# Patient Record
Sex: Male | Born: 1950 | Race: Black or African American | Hispanic: No | Marital: Married | Smoking: Never smoker
Health system: Southern US, Community
[De-identification: ages and names within clinical notes are randomized; demographics above are authoritative.]

## PROBLEM LIST (undated history)

## (undated) DIAGNOSIS — I2510 Atherosclerotic heart disease of native coronary artery without angina pectoris: Secondary | ICD-10-CM

## (undated) DIAGNOSIS — E785 Hyperlipidemia, unspecified: Secondary | ICD-10-CM

## (undated) DIAGNOSIS — C61 Malignant neoplasm of prostate: Secondary | ICD-10-CM

## (undated) DIAGNOSIS — E119 Type 2 diabetes mellitus without complications: Secondary | ICD-10-CM

## (undated) DIAGNOSIS — M1990 Unspecified osteoarthritis, unspecified site: Secondary | ICD-10-CM

## (undated) DIAGNOSIS — M1612 Unilateral primary osteoarthritis, left hip: Secondary | ICD-10-CM

## (undated) DIAGNOSIS — I10 Essential (primary) hypertension: Secondary | ICD-10-CM

## (undated) DIAGNOSIS — I700 Atherosclerosis of aorta: Secondary | ICD-10-CM

## (undated) HISTORY — DX: Essential (primary) hypertension: I10

## (undated) HISTORY — DX: Hyperlipidemia, unspecified: E78.5

## (undated) HISTORY — DX: Type 2 diabetes mellitus without complications: E11.9

## (undated) HISTORY — DX: Atherosclerosis of aorta: I70.0

## (undated) HISTORY — PX: JOINT REPLACEMENT: SHX530

## (undated) HISTORY — DX: Unspecified osteoarthritis, unspecified site: M19.90

## (undated) HISTORY — DX: Malignant neoplasm of prostate: C61

## (undated) HISTORY — DX: Unilateral primary osteoarthritis, left hip: M16.12

## (undated) HISTORY — DX: Atherosclerotic heart disease of native coronary artery without angina pectoris: I25.10

## (undated) HISTORY — PX: PROSTATE BIOPSY: SHX241

---

## 2012-08-26 ENCOUNTER — Ambulatory Visit (INDEPENDENT_AMBULATORY_CARE_PROVIDER_SITE_OTHER): Payer: Managed Care, Other (non HMO) | Admitting: Internal Medicine

## 2012-08-26 ENCOUNTER — Ambulatory Visit: Payer: Managed Care, Other (non HMO)

## 2012-08-26 VITALS — BP 133/84 | HR 66 | Temp 97.8°F | Resp 16 | Ht 65.0 in | Wt 180.0 lb

## 2012-08-26 DIAGNOSIS — M25552 Pain in left hip: Secondary | ICD-10-CM

## 2012-08-26 DIAGNOSIS — Z01818 Encounter for other preprocedural examination: Secondary | ICD-10-CM

## 2012-08-26 DIAGNOSIS — M1610 Unilateral primary osteoarthritis, unspecified hip: Secondary | ICD-10-CM

## 2012-08-26 DIAGNOSIS — Z0000 Encounter for general adult medical examination without abnormal findings: Secondary | ICD-10-CM

## 2012-08-26 DIAGNOSIS — R7302 Impaired glucose tolerance (oral): Secondary | ICD-10-CM

## 2012-08-26 DIAGNOSIS — M25559 Pain in unspecified hip: Secondary | ICD-10-CM

## 2012-08-26 DIAGNOSIS — R7309 Other abnormal glucose: Secondary | ICD-10-CM

## 2012-08-26 LAB — LIPID PANEL: Total CHOL/HDL Ratio: 4.4 Ratio

## 2012-08-26 LAB — COMPREHENSIVE METABOLIC PANEL
ALT: 25 U/L (ref 0–53)
CO2: 25 mEq/L (ref 19–32)
Calcium: 9.8 mg/dL (ref 8.4–10.5)
Chloride: 103 mEq/L (ref 96–112)
Sodium: 138 mEq/L (ref 135–145)
Total Bilirubin: 0.6 mg/dL (ref 0.3–1.2)
Total Protein: 6.9 g/dL (ref 6.0–8.3)

## 2012-08-26 LAB — POCT CBC
Granulocyte percent: 69.1 %G (ref 37–80)
MID (cbc): 0.5 (ref 0–0.9)
POC Granulocyte: 6.4 (ref 2–6.9)
POC LYMPH PERCENT: 25.3 %L (ref 10–50)
Platelet Count, POC: 368 10*3/uL (ref 142–424)
RDW, POC: 13.1 %

## 2012-08-26 LAB — POCT UA - MICROSCOPIC ONLY

## 2012-08-26 LAB — POCT URINALYSIS DIPSTICK
Bilirubin, UA: NEGATIVE
Glucose, UA: NEGATIVE
Ketones, UA: NEGATIVE
Leukocytes, UA: NEGATIVE

## 2012-08-26 LAB — TSH: TSH: 1.22 u[IU]/mL (ref 0.350–4.500)

## 2012-08-26 LAB — IFOBT (OCCULT BLOOD): IFOBT: NEGATIVE

## 2012-08-26 NOTE — Progress Notes (Signed)
Subjective:    [name], male    DOB: [birth year], 62 y.o.   MRN: [id number]  HPI Needs a left total hip, having progressive pain. Needs surgical clearance. No other complaints   Review of Systems  Constitutional: Negative.   HENT: Negative.   Eyes: Positive for visual disturbance.  Respiratory: Negative.   Cardiovascular: Negative.   Gastrointestinal: Negative.   Genitourinary: Negative.   Musculoskeletal: Positive for myalgias, arthralgias and gait problem.  Neurological: Negative.   Hematological: Negative.   Psychiatric/Behavioral: Negative.        Objective:   Physical Exam  Constitutional: He is oriented to person, place, and time. He appears well-developed and well-nourished. He appears distressed.  HENT:  Right Ear: External ear normal.  Left Ear: External ear normal.  Nose: Nose normal.  Mouth/Throat: Oropharynx is clear and moist.  Eyes: EOM and lids are normal. Pupils are equal, round, and reactive to light. No scleral icterus.         Pterygium right worse than left  Neck: Neck supple. No tracheal deviation present. No thyromegaly present.  Cardiovascular: Normal rate and normal heart sounds.   Pulmonary/Chest: Effort normal and breath sounds normal.  Abdominal: Soft. Bowel sounds are normal. He exhibits no mass. There is no tenderness.  Genitourinary: Prostate normal and penis normal.  Musculoskeletal: He exhibits tenderness.  Lymphadenopathy:    He has no cervical adenopathy.  Neurological: He is alert and oriented to person, place, and time. He has normal strength and normal reflexes. No cranial nerve deficit or sensory deficit. Gait abnormal. Coordination normal.   EKG normal  [facility] reading (PRIMARY) by  Dr.Wade Asebedo NAD normal chest Results for orders placed in visit on 08/26/12  POCT CBC      Component Value Range   WBC 9.3  4.6 - 10.2 K/uL   Lymph, poc 2.4  0.6 - 3.4   POC LYMPH PERCENT 25.3  10 - 50 %L   MID (cbc) 0.5  0 - 0.9   POC MID % 5.6  0 - 12 %M   POC Granulocyte 6.4  2 - 6.9   Granulocyte percent 69.1  37 - 80 %G   RBC 5.21  4.69 - 6.13 M/uL   Hemoglobin 15.0  14.1 - 18.1 g/dL   HCT, POC 40.9  81.1 - 53.7 %   MCV 93.3  80 - 97 fL   MCH, POC 28.8  27 - 31.2 pg   MCHC 30.9 (*) 31.8 - 35.4 g/dL   RDW, POC 91.4     Platelet Count, POC 368  142 - 424 K/uL   MPV 9.3  0 - 99.8 fL  POCT UA - MICROSCOPIC ONLY      Component Value Range   WBC, Ur, HPF, POC 0-1     RBC, urine, microscopic 0-2     Bacteria, U Microscopic neg     Mucus, UA neg     Epithelial cells, urine per micros neg     Crystals, Ur, HPF, POC neg     Casts, Ur, LPF, POC neg     Yeast, UA neg    POCT URINALYSIS DIPSTICK      Component Value Range   Color, UA yellow     Clarity, UA clear     Glucose, UA neg     Bilirubin, UA neg     Ketones, UA neg     Spec Grav, UA 1.025     Blood, UA neg  pH, UA 7.0     Protein, UA neg     Urobilinogen, UA 0.2     Nitrite, UA neg     Leukocytes, UA Negative    IFOBT (OCCULT BLOOD)      Component Value Range   IFOBT Negative           Assessment & Plan:  Cleared for surgery Schedule colonocopy

## 2012-08-26 NOTE — Patient Instructions (Addendum)
DASH Diet The DASH diet stands for "Dietary Approaches to Stop Hypertension." It is a healthy eating plan that has been shown to reduce high blood pressure (hypertension) in as little as 14 days, while also possibly providing other significant health benefits. These other health benefits include reducing the risk of breast cancer after menopause and reducing the risk of type 2 diabetes, heart disease, colon cancer, and stroke. Health benefits also include weight loss and slowing kidney failure in patients with chronic kidney disease.  DIET GUIDELINES  Limit salt (sodium). Your diet should contain less than 1500 mg of sodium daily.  Limit refined or processed carbohydrates. Your diet should include mostly whole grains. Desserts and added sugars should be used sparingly.  Include small amounts of heart-healthy fats. These types of fats include nuts, oils, and tub margarine. Limit saturated and trans fats. These fats have been shown to be harmful in the body. CHOOSING FOODS  The following food groups are based on a 2000 calorie diet. See your Registered Dietitian for individual calorie needs. Grains and Grain Products (6 to 8 servings daily)  Eat More Often: Whole-wheat bread, brown rice, whole-grain or wheat pasta, quinoa, popcorn without added fat or salt (air popped).  Eat Less Often: White bread, white pasta, white rice, cornbread. Vegetables (4 to 5 servings daily)  Eat More Often: Fresh, frozen, and canned vegetables. Vegetables may be raw, steamed, roasted, or grilled with a minimal amount of fat.  Eat Less Often/Avoid: Creamed or fried vegetables. Vegetables in a cheese sauce. Fruit (4 to 5 servings daily)  Eat More Often: All fresh, canned (in natural juice), or frozen fruits. Dried fruits without added sugar. One hundred percent fruit juice ( cup [237 mL] daily).  Eat Less Often: Dried fruits with added sugar. Canned fruit in light or heavy syrup. [employer], Fish, and Poultry (2  servings or less daily. One serving is 3 to 4 oz [85-114 g]).  Eat More Often: Ninety percent or leaner ground beef, tenderloin, sirloin. Round cuts of beef, chicken breast, [place] breast. All fish. Grill, bake, or broil your meat. Nothing should be fried.  Eat Less Often/Avoid: Fatty cuts of meat, [place], or chicken leg, thigh, or wing. Fried cuts of meat or fish. Dairy (2 to 3 servings)  Eat More Often: Low-fat or fat-free milk, low-fat plain or light yogurt, reduced-fat or part-skim cheese.  Eat Less Often/Avoid: Milk (whole, 2%).Whole milk yogurt. Full-fat cheeses. Nuts, Seeds, and Legumes (4 to 5 servings per week)  Eat More Often: All without added salt.  Eat Less Often/Avoid: Salted nuts and seeds, canned beans with added salt. Fats and Sweets (limited)  Eat More Often: Vegetable oils, tub margarines without trans fats, sugar-free gelatin. Mayonnaise and salad dressings.  Eat Less Often/Avoid: Coconut oils, palm oils, butter, stick margarine, cream, half and half, cookies, candy, pie. FOR MORE INFORMATION The Dash Diet Eating Plan: www.dashdiet.org Document Released: 07/07/2011 Document Revised: 10/10/2011 Document Reviewed: 07/07/2011 [facility] Patient Information 2013 [place], [place]. Pterygium Excision Pterygia are fleshy growths that arise from the conjuctiva. This is the red velvety membrane you see when you pull your lower eyelid down. When they grow out over the cornea (clear membrane on the front of your eye), they block vision. It becomes difficult to see. It may also cause irritation, making the eye red and sore. It also causes cosmetic problems. This means your eye does not look as good as when it was healthy. One of the most common problems with pterygia are  that they often come back even after complete removal. TREATMENT  Pterygia are removed with a procedure. This is often done with a local anesthetic. This is a medicine that makes the eye and area being worked on  numb. They are often removed using a microscope. This is an instrument the surgeon looks through that magnifies the small area of the procedure. When these operations are done with the patient awake, the patient must be able to hold still and cooperate with the surgeon's instructions. HOME CARE INSTRUCTIONS   If a dressing was applied, this may be changed once per day or as instructed. Your caregiver will instruct you in your care.  If eyedrops or ointment was prescribed, use as directed for the full time directed.  Should your eye become more red and swollen with use of medicines, let your caregiver know. This could be an allergic reaction.  Only take over-the-counter or prescription medicines for pain, discomfort, or fever as directed by your caregiver. SEEK IMMEDIATE MEDICAL CARE IF:   You have redness, swelling, or increasing pain near or around the eye.  You notice a change in your vision.  You have pus coming from the wound.  You have a fever.  You develop a cough, shortness of breath, or chest pain. Document Released: 04/12/2001 Document Revised: 10/10/2011 Document Reviewed: 06/14/2007 [facility] Patient Information 2013 [place], [place]. Colorectal Cancer Colorectal cancer is an abnormal growth of tissue (tumor) in the colon or rectum that is cancerous (malignant). Unlike noncancerous (benign) tumors, malignant tumors can spread to other parts of your body. The colon is the large bowel or large intestine. The rectum is the last several inches of the colon. CAUSES  The exact cause of colon cancer is unknown.  RISK FACTORS The majority of patients do not have identifiable risk factors, but the following factors may increase your chances of getting colon cancer:  Age. Most colorectal cancers occur in people older than 50 years.  Having abnormal growths (polyps) on the inner wall of the colon or rectum.  Diabetes.  Being African American.  Family history of hereditary  nonpolyposis colon cancer. This condition is caused by changes in the genes that are responsible for repairing mismatched DNA.  Family history of familial adenomatous polyposis (FAP). This is a rare, inherited condition in which hundreds of polyps form in the colon and rectum. It is caused by a change in the APC gene. Unless FAP is treated, it usually leads to colorectal cancer by age 87.  Personal history of cancer. A person who has already had colorectal cancer may develop it a second time. Also, women with a history of ovarian, uterine, or breast cancer are at a somewhat higher risk of developing colorectal cancer.  Inflammatory bowel disease, including ulcerative colitis and Crohn's disease.  Being obese or eating a diet that is high in fat (especially animal fat) and low in fiber, fruits, and vegetables.  Smoking. A person who smokes cigarettes may be at increased risk of developing polyps and colorectal cancer.  Heavy alcohol use. SYMPTOMS Early colorectal cancer often does not cause symptoms. As the cancer grows, symptoms may include:  Diarrhea.  Constipation.  Feeling like the bowel does not empty completely after a bowel movement.  Blood in the stool.  Stools that are narrower than usual.  Abdominal discomfort, pain, bloating, fullness, or cramps.  Unexplained weight loss.  Constant tiredness.  Nausea and vomiting. DIAGNOSIS  Your caregiver will ask about your medical history. He or she may  also perform a number of procedures, such as:  A physical exam.  Blood tests. This may include a routine complete blood count and iron level testing. Your caregiver may also check for carcinoembryonic antigen (CEA) and other substances in the blood. Some people who have colorectal cancer have a high CEA level. These levels may be used to follow the activity of your colon cancer.  Chest X-rays, computed tomography (CT) scans, or magnetic resonance imaging (MRI).  Taking a tissue  sample (biopsy) from the colon or rectum. The sample is examined under a microscope to look for cancer cells.  Sigmoidoscopy. With this test, your caregiver can see inside your colon. A thin flexible tube (sigmoidoscope) is placed into your rectum. This device has a light source and a tiny video camera in it. Your caregiver uses the sigmoidoscope to look at the last third of your colon.  Colonoscopy. This test is like sigmoidoscopy, but your caregiver looks at the entire colon. This test usually requires medicine that helps you relax (sedative).  Endorectal ultrasound. With this test, your caregiver can see how deep a rectal tumor has grown and whether the cancer has spread to lymph nodes or other nearby tissues. A tool (probe) is inserted into the rectum. The probe sends out sound waves to the rectum and nearby tissues, and a computer uses the echoes to create a picture. Your cancer will be staged to determine its severity and extent. Staging is a careful attempt to find out the size of the tumor, whether the cancer has spread, and if so, to what parts of the body. You may need to have more tests to determine the stage of your cancer. The test results will help determine what treatment plan is best for you. STAGES  Stage 0. The cancer is found only in the innermost lining of the colon or rectum.  Stage I. The cancer has grown into the inner wall of the colon or rectum. The cancer has not yet reached the outer wall of the colon.  Stage II. The cancer extends more deeply into or through the wall of the colon or rectum. It may have invaded nearby tissue, but cancer cells have not spread to the lymph nodes.  Stage III. The cancer has spread to nearby lymph nodes but not to other parts of the body.  Stage IV. The cancer has spread to other parts of the body, such as the liver or lungs. Your caregiver may tell you the detailed stage of your cancer. In that case, the stage will include both a number and  a letter. TREATMENT  Depending on the type and stage, colorectal cancer may be treated with surgery, radiation therapy, or chemotherapy. Some patients have a combination of these therapies.  Surgery may be done to remove the polyps from your colon. In early stages, your caregiver may be able to do this during a colonoscopy. In later stages, surgery may be done to remove part of your colon.  Radiation therapy uses high-energy rays to kill cancer cells. This is usually recommended for patients with rectal cancer.  Chemotherapy is the use of drugs to kill cancer cells. Caregivers also give chemotherapy to help reduce pain and other problems caused by colorectal cancer. This may be done even if the cancer is not curable. HOME CARE INSTRUCTIONS   Only take over-the-counter or prescription medicines for pain, discomfort, or fever as directed by your caregiver.  Maintain a healthy diet.  Consider joining a support group. This may  help you learn to cope with the stress of having colorectal cancer.  Seek advice to help you manage treatment side effects.  Keep all follow-up appointments as directed by your caregiver.  Inform your cancer specialist if you are admitted to the hospital. SEEK IMMEDIATE MEDICAL CARE IF:   Your diarrhea or constipation does not go away.  You have alternating constipation and diarrhea.  You have blood in your stools.  Your abdominal pain gets worse.  You lose weight without trying.  You notice new fatigue or weakness.  You develop a fever during chemotherapy treatment. Document Released: 07/18/2005 Document Revised: 10/10/2011 Document Reviewed: 07/05/2011 [facility] Patient Information 2013 [place], [place].

## 2012-08-27 ENCOUNTER — Telehealth: Payer: Self-pay

## 2012-08-27 LAB — PSA: PSA: 4.41 ng/mL — ABNORMAL HIGH (ref <=4.00)

## 2012-08-27 NOTE — Telephone Encounter (Signed)
KELLY FROM [facility] CALLED AND SAID PATIENT CAME IN YESTERDAY HERE TO GET SURGICAL CLEARANCE.  THEY NEED TO KNOW IF HE WAS ABLE TO OBTAIN THIS.  PLEASE FAX NOTES TO [phone] OR CALL KELLY AT [phone]. [phone] FOR MORE INFORMATION.

## 2012-08-27 NOTE — Telephone Encounter (Signed)
Records sent to [facility] at [facility] at [phone].

## 2012-09-10 ENCOUNTER — Encounter (HOSPITAL_COMMUNITY): Payer: Self-pay | Admitting: Pharmacy Technician

## 2012-09-14 ENCOUNTER — Encounter (HOSPITAL_COMMUNITY): Payer: Self-pay

## 2012-09-14 ENCOUNTER — Other Ambulatory Visit: Payer: Self-pay | Admitting: Physician Assistant

## 2012-09-14 ENCOUNTER — Encounter (HOSPITAL_COMMUNITY)
Admission: RE | Admit: 2012-09-14 | Discharge: 2012-09-14 | Disposition: A | Discharge status: Home / Self Care | Payer: Managed Care, Other (non HMO) | Source: Ambulatory Visit | Attending: Orthopedic Surgery | Admitting: Orthopedic Surgery

## 2012-09-14 LAB — CBC WITH DIFFERENTIAL/PLATELET
Basophils Absolute: 0 10*3/uL (ref 0.0–0.1)
Basophils Relative: 0 % (ref 0–1)
Lymphocytes Relative: 27 % (ref 12–46)
MCHC: 34.4 g/dL (ref 30.0–36.0)
Neutro Abs: 4.7 10*3/uL (ref 1.7–7.7)
Neutrophils Relative %: 65 % (ref 43–77)
Platelets: 359 10*3/uL (ref 150–400)
RDW: 12.4 % (ref 11.5–15.5)
WBC: 7.1 10*3/uL (ref 4.0–10.5)

## 2012-09-14 LAB — COMPREHENSIVE METABOLIC PANEL
ALT: 24 U/L (ref 0–53)
AST: 20 U/L (ref 0–37)
Albumin: 4.3 g/dL (ref 3.5–5.2)
CO2: 27 mEq/L (ref 19–32)
Chloride: 103 mEq/L (ref 96–112)
Creatinine, Ser: 0.84 mg/dL (ref 0.50–1.35)
GFR calc non Af Amer: >90 mL/min (ref >90)
Sodium: 143 mEq/L (ref 135–145)
Total Bilirubin: 0.3 mg/dL (ref 0.3–1.2)

## 2012-09-14 LAB — URINALYSIS, ROUTINE W REFLEX MICROSCOPIC
Glucose, UA: NEGATIVE mg/dL
Ketones, ur: NEGATIVE mg/dL
Leukocytes, UA: NEGATIVE
Nitrite: NEGATIVE
Specific Gravity, Urine: 1.03 (ref 1.005–1.030)
pH: 6 (ref 5.0–8.0)

## 2012-09-14 LAB — PROTIME-INR: INR: 0.99 (ref 0.00–1.49)

## 2012-09-14 LAB — SURGICAL PCR SCREEN: MRSA, PCR: NEGATIVE

## 2012-09-14 LAB — APTT: aPTT: 33 seconds (ref 24–37)

## 2012-09-14 NOTE — Pre-Procedure Instructions (Signed)
Kenneth Velez  09/14/2012   Your procedure is scheduled on:  Friday, February 21  Report to [facility] at 0530 AM.  Call this number if you have problems the morning of surgery: [phone]   Remember:   Do not eat food or drink liquids after midnight.Thursday night   Take these medicines the morning of surgery with A SIP OF WATER: none   Do not wear jewelry, make-up or nail polish.  Do not wear lotions, powders, or perfumes. You may wear deodorant.  Do not shave 48 hours prior to surgery. Men may shave face and neck.  Do not bring valuables to the hospital.  Contacts, dentures or bridgework may not be worn into surgery.  Leave suitcase in the car. After surgery it may be brought to your room.  For patients admitted to the hospital, checkout time is 11:00 AM the day of  discharge.   Special Instructions: Incentive Spirometry - Practice and bring it with you on the day of surgery. Shower using CHG 2 nights before surgery and the night before surgery.  If you shower the day of surgery use CHG.  Use special wash - you have one bottle of CHG for all showers.  You should use approximately 1/3 of the bottle for each shower.   Please read over the following fact sheets that you were given: Pain Booklet, Coughing and Deep Breathing, Blood Transfusion Information, Total Joint Packet and Surgical Site Infection Prevention

## 2012-09-16 LAB — URINE CULTURE: Colony Count: NO GROWTH

## 2012-09-20 MED ORDER — CHLORHEXIDINE GLUCONATE 4 % EX LIQD: 60.0000 mL | Freq: Once | CUTANEOUS | Status: DC

## 2012-09-20 MED ORDER — SODIUM CHLORIDE 0.9 % IV SOLN: INTRAVENOUS | Status: DC

## 2012-09-20 MED ORDER — ACETAMINOPHEN 10 MG/ML IV SOLN
1000.0000 mg | Freq: Four times a day (QID) | INTRAVENOUS | Status: DC
  Filled 2012-09-20 (×4): qty 100

## 2012-09-20 MED ORDER — CEFAZOLIN SODIUM-DEXTROSE 2-3 GM-% IV SOLR
2.0000 g | INTRAVENOUS | Status: AC
  Administered 2012-09-21: 2 g via INTRAVENOUS
  Filled 2012-09-20: qty 50

## 2012-09-20 NOTE — H&P (Addendum)
TOTAL HIP ADMISSION H&P  Patient is admitted for left total hip arthroplasty.  Subjective:  Chief Complaint: left hip pain  HPI: Kenneth Velez, 62 y.o. 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  Onset of symptoms was gradual starting >10 years ago with gradually worsening course since that time.The patient noted no past surgery on the left hip(s).  Patient currently rates pain in the left hip as moderate to severe with activity. Patient has night pain, worsening of pain with activity and weight bearing, pain that interfers with activities of daily living and pain with passive range of motion. Patient has evidence of periarticular osteophytes and joint space narrowing by imaging studies. This condition presents safety issues increasing the risk of falls. There is no current active infection.  There are no active problems to display for this patient.  Past Medical History  Diagnosis Date  . Arthritis     No past surgical history on file.   (Not in a hospital admission) No Known Allergies  History  Substance Use Topics  . Smoking status: Never Smoker   . Smokeless tobacco: Never Used  . Alcohol Use: Yes     Comment: occasional    No family history on file.   Review of Systems  Constitutional: Negative.   HENT: Negative.   Eyes: Positive for blurred vision and redness. Negative for double vision and discharge.  Respiratory: Negative.   Cardiovascular: Negative.   Gastrointestinal: Negative.   Genitourinary: Negative.   Musculoskeletal: Positive for joint pain. Negative for falls.  Skin: Negative.   Neurological: Negative.   Endo/Heme/Allergies: Negative.   Psychiatric/Behavioral: Negative for depression and suicidal ideas.     Objective:  Physical Exam  Constitutional: He is oriented to person, place, and time. He appears well-developed and well-nourished. No distress.  HENT:  Head: Normocephalic and atraumatic.  Nose: Nose normal.  Eyes: EOM are normal. Pupils are equal, round, and reactive to light. Left eye exhibits no discharge.  Neck: Normal range of motion. Neck supple.  Cardiovascular: Normal rate and regular rhythm.   Respiratory: Effort normal and breath sounds normal. No respiratory distress. He has no wheezes. He exhibits no tenderness.  GI: Soft. Bowel sounds are normal. He exhibits no distension. There is no tenderness.  Musculoskeletal:       Left hip: He exhibits decreased range of motion, tenderness and bony tenderness.  Lymphadenopathy:    He has no cervical adenopathy.  Neurological: He is alert and oriented to person, place, and time. No cranial nerve deficit.  Skin: Skin is warm and dry. No rash noted. No erythema.  Psychiatric: He has a normal mood and affect. His behavior is normal.    Vital signs in last 24 hours: @VSRANGES @  Labs:   Estimated body mass index is 29.70 kg/(m^2) as calculated from the following:   Height as of 08/26/12: 5\' 5"  (1.651 m).   Weight as of an earlier encounter on 09/14/12: 80.967 kg (178 lb 8 oz).   Imaging Review Plain radiographs demonstrate severe degenerative joint disease of the left hip(s). The bone quality appears to be good for age and reported activity level.  Assessment/Plan:  End stage arthritis, left hip(s)  The patient history, physical examination, clinical judgement of the provider and imaging studies are consistent with end  stage degenerative joint disease of the left hip(s) and total hip arthroplasty is deemed medically necessary. The treatment options including medical management, injection therapy, arthroscopy and arthroplasty were discussed at length. The risks and benefits of total hip arthroplasty were presented and reviewed.  The risks due to aseptic loosening, infection, stiffness, dislocation/subluxation,  thromboembolic complications and other imponderables were discussed.  The patient acknowledged the explanation, agreed to proceed with the plan and consent was signed. Patient is being admitted for inpatient treatment for surgery, pain control, PT, OT, prophylactic antibiotics, VTE prophylaxis, progressive ambulation and ADL's and discharge planning.The patient is planning to be discharged home with home health services

## 2012-09-21 ENCOUNTER — Inpatient Hospital Stay (HOSPITAL_COMMUNITY): Payer: Managed Care, Other (non HMO)

## 2012-09-21 ENCOUNTER — Inpatient Hospital Stay (HOSPITAL_COMMUNITY): Payer: Managed Care, Other (non HMO) | Admitting: Anesthesiology

## 2012-09-21 ENCOUNTER — Encounter (HOSPITAL_COMMUNITY): Payer: Self-pay | Admitting: *Deleted

## 2012-09-21 ENCOUNTER — Encounter (HOSPITAL_COMMUNITY): Payer: Self-pay | Admitting: Anesthesiology

## 2012-09-21 ENCOUNTER — Inpatient Hospital Stay (HOSPITAL_COMMUNITY)
Admission: RE | Admit: 2012-09-21 | Discharge: 2012-09-24 | DRG: 470 | Disposition: A | Discharge status: Home Health | Payer: Managed Care, Other (non HMO) | Source: Ambulatory Visit | Attending: Orthopedic Surgery | Admitting: Orthopedic Surgery

## 2012-09-21 ENCOUNTER — Encounter (HOSPITAL_COMMUNITY)
Admission: RE | Disposition: A | Discharge status: Home Health | Payer: Self-pay | Source: Ambulatory Visit | Attending: Orthopedic Surgery

## 2012-09-21 DIAGNOSIS — Z01812 Encounter for preprocedural laboratory examination: Secondary | ICD-10-CM

## 2012-09-21 DIAGNOSIS — IMO0002 Reserved for concepts with insufficient information to code with codable children: Secondary | ICD-10-CM | POA: Diagnosis not present

## 2012-09-21 DIAGNOSIS — M1612 Unilateral primary osteoarthritis, left hip: Secondary | ICD-10-CM | POA: Diagnosis present

## 2012-09-21 DIAGNOSIS — M1610 Unilateral primary osteoarthritis, unspecified hip: Principal | ICD-10-CM | POA: Diagnosis present

## 2012-09-21 DIAGNOSIS — Y658 Other specified misadventures during surgical and medical care: Secondary | ICD-10-CM | POA: Diagnosis not present

## 2012-09-21 HISTORY — PX: TOTAL HIP ARTHROPLASTY: SHX124

## 2012-09-21 HISTORY — DX: Unilateral primary osteoarthritis, left hip: M16.12

## 2012-09-21 LAB — TYPE AND SCREEN: Antibody Screen: NEGATIVE

## 2012-09-21 LAB — CBC
HCT: 37.2 % — ABNORMAL LOW (ref 39.0–52.0)
Hemoglobin: 12.7 g/dL — ABNORMAL LOW (ref 13.0–17.0)
MCHC: 34.1 g/dL (ref 30.0–36.0)
WBC: 22.5 10*3/uL — ABNORMAL HIGH (ref 4.0–10.5)

## 2012-09-21 LAB — CREATININE, SERUM
GFR calc Af Amer: >90 mL/min (ref >90)
GFR calc non Af Amer: >90 mL/min (ref >90)

## 2012-09-21 LAB — ABO/RH: ABO/RH(D): A POS

## 2012-09-21 SURGERY — ARTHROPLASTY, HIP, TOTAL,POSTERIOR APPROACH
Anesthesia: General | Site: Hip | Laterality: Left | Wound class: Clean

## 2012-09-21 MED ORDER — OXYCODONE HCL 5 MG/5ML PO SOLN: 5.0000 mg | Freq: Once | ORAL | Status: DC | PRN

## 2012-09-21 MED ORDER — CEFAZOLIN SODIUM 1-5 GM-% IV SOLN
1.0000 g | Freq: Four times a day (QID) | INTRAVENOUS | Status: AC
  Administered 2012-09-21 (×2): 1 g via INTRAVENOUS
  Filled 2012-09-21 (×2): qty 50

## 2012-09-21 MED ORDER — OXYCODONE HCL 5 MG PO TABS
5.0000 mg | ORAL_TABLET | ORAL | Status: DC | PRN
  Administered 2012-09-21: 5 mg via ORAL
  Administered 2012-09-22 – 2012-09-24 (×8): 10 mg via ORAL
  Filled 2012-09-21 (×4): qty 2
  Filled 2012-09-21: qty 1
  Filled 2012-09-21 (×4): qty 2

## 2012-09-21 MED ORDER — MIDAZOLAM HCL 5 MG/5ML IJ SOLN
INTRAMUSCULAR | Status: DC | PRN
  Administered 2012-09-21: 2 mg via INTRAVENOUS

## 2012-09-21 MED ORDER — METHOCARBAMOL 500 MG PO TABS: 500.0000 mg | ORAL_TABLET | Freq: Four times a day (QID) | ORAL | Status: DC

## 2012-09-21 MED ORDER — LACTATED RINGERS IV SOLN
INTRAVENOUS | Status: DC | PRN
  Administered 2012-09-21 (×2): via INTRAVENOUS

## 2012-09-21 MED ORDER — MENTHOL 3 MG MT LOZG: 1.0000 | LOZENGE | OROMUCOSAL | Status: DC | PRN

## 2012-09-21 MED ORDER — ACETAMINOPHEN 650 MG RE SUPP: 650.0000 mg | Freq: Four times a day (QID) | RECTAL | Status: DC | PRN

## 2012-09-21 MED ORDER — PROPOFOL 10 MG/ML IV BOLUS
INTRAVENOUS | Status: DC | PRN
  Administered 2012-09-21: 160 mg via INTRAVENOUS

## 2012-09-21 MED ORDER — HYDROMORPHONE HCL PF 1 MG/ML IJ SOLN
1.0000 mg | INTRAMUSCULAR | Status: DC | PRN
  Administered 2012-09-21 – 2012-09-22 (×4): 1 mg via INTRAVENOUS
  Filled 2012-09-21 (×4): qty 1

## 2012-09-21 MED ORDER — METHOCARBAMOL 500 MG PO TABS
500.0000 mg | ORAL_TABLET | Freq: Four times a day (QID) | ORAL | Status: DC | PRN
  Administered 2012-09-22 – 2012-09-23 (×3): 500 mg via ORAL
  Filled 2012-09-21 (×3): qty 1

## 2012-09-21 MED ORDER — FLEET ENEMA 7-19 GM/118ML RE ENEM: 1.0000 | ENEMA | Freq: Once | RECTAL | Status: AC | PRN

## 2012-09-21 MED ORDER — DIPHENHYDRAMINE HCL 12.5 MG/5ML PO ELIX: 12.5000 mg | ORAL_SOLUTION | ORAL | Status: DC | PRN

## 2012-09-21 MED ORDER — METOCLOPRAMIDE HCL 5 MG PO TABS
5.0000 mg | ORAL_TABLET | Freq: Three times a day (TID) | ORAL | Status: DC | PRN
  Filled 2012-09-21: qty 2

## 2012-09-21 MED ORDER — FENTANYL CITRATE 0.05 MG/ML IJ SOLN
INTRAMUSCULAR | Status: DC | PRN
  Administered 2012-09-21 (×4): 50 ug via INTRAVENOUS
  Administered 2012-09-21: 100 ug via INTRAVENOUS
  Administered 2012-09-21 (×2): 50 ug via INTRAVENOUS

## 2012-09-21 MED ORDER — ONDANSETRON HCL 4 MG/2ML IJ SOLN: 4.0000 mg | Freq: Four times a day (QID) | INTRAMUSCULAR | Status: DC | PRN

## 2012-09-21 MED ORDER — ENOXAPARIN SODIUM 30 MG/0.3ML ~~LOC~~ SOLN
30.0000 mg | Freq: Two times a day (BID) | SUBCUTANEOUS | Status: DC
  Administered 2012-09-22 – 2012-09-24 (×5): 30 mg via SUBCUTANEOUS
  Filled 2012-09-21 (×7): qty 0.3

## 2012-09-21 MED ORDER — LIDOCAINE HCL (CARDIAC) 20 MG/ML IV SOLN
INTRAVENOUS | Status: DC | PRN
  Administered 2012-09-21: 80 mg via INTRAVENOUS

## 2012-09-21 MED ORDER — PHENOL 1.4 % MT LIQD: 1.0000 | OROMUCOSAL | Status: DC | PRN

## 2012-09-21 MED ORDER — PHENYLEPHRINE HCL 10 MG/ML IJ SOLN
INTRAMUSCULAR | Status: DC | PRN
  Administered 2012-09-21: 80 ug via INTRAVENOUS

## 2012-09-21 MED ORDER — ONDANSETRON HCL 4 MG/2ML IJ SOLN
INTRAMUSCULAR | Status: DC | PRN
  Administered 2012-09-21: 4 mg via INTRAVENOUS

## 2012-09-21 MED ORDER — ACETAMINOPHEN 10 MG/ML IV SOLN
1000.0000 mg | Freq: Four times a day (QID) | INTRAVENOUS | Status: AC
  Administered 2012-09-21 – 2012-09-22 (×4): 1000 mg via INTRAVENOUS
  Filled 2012-09-21 (×4): qty 100

## 2012-09-21 MED ORDER — ROCURONIUM BROMIDE 100 MG/10ML IV SOLN
INTRAVENOUS | Status: DC | PRN
  Administered 2012-09-21: 5 mg via INTRAVENOUS
  Administered 2012-09-21: 10 mg via INTRAVENOUS
  Administered 2012-09-21: 50 mg via INTRAVENOUS

## 2012-09-21 MED ORDER — SENNOSIDES-DOCUSATE SODIUM 8.6-50 MG PO TABS: 1.0000 | ORAL_TABLET | Freq: Every evening | ORAL | Status: DC | PRN

## 2012-09-21 MED ORDER — OXYCODONE HCL 5 MG PO TABS: 5.0000 mg | ORAL_TABLET | Freq: Once | ORAL | Status: DC | PRN

## 2012-09-21 MED ORDER — OXYCODONE-ACETAMINOPHEN 5-325 MG PO TABS: ORAL_TABLET | ORAL | Status: DC

## 2012-09-21 MED ORDER — SODIUM CHLORIDE 0.9 % IR SOLN
Status: DC | PRN
  Administered 2012-09-21: 1000 mL

## 2012-09-21 MED ORDER — HYDROMORPHONE HCL PF 1 MG/ML IJ SOLN
INTRAMUSCULAR | Status: AC
  Filled 2012-09-21: qty 2

## 2012-09-21 MED ORDER — HYDROMORPHONE HCL PF 1 MG/ML IJ SOLN
0.2500 mg | INTRAMUSCULAR | Status: DC | PRN
  Administered 2012-09-21 (×3): 0.5 mg via INTRAVENOUS

## 2012-09-21 MED ORDER — METHOCARBAMOL 100 MG/ML IJ SOLN: 500.0000 mg | Freq: Four times a day (QID) | INTRAVENOUS | Status: DC | PRN

## 2012-09-21 MED ORDER — METOCLOPRAMIDE HCL 5 MG/ML IJ SOLN: 5.0000 mg | Freq: Three times a day (TID) | INTRAMUSCULAR | Status: DC | PRN

## 2012-09-21 MED ORDER — SODIUM CHLORIDE 0.9 % IV SOLN
INTRAVENOUS | Status: DC
  Administered 2012-09-21 – 2012-09-22 (×2): via INTRAVENOUS

## 2012-09-21 MED ORDER — NEOSTIGMINE METHYLSULFATE 1 MG/ML IJ SOLN
INTRAMUSCULAR | Status: DC | PRN
  Administered 2012-09-21: 4 mg via INTRAVENOUS

## 2012-09-21 MED ORDER — DOCUSATE SODIUM 100 MG PO CAPS
100.0000 mg | ORAL_CAPSULE | Freq: Two times a day (BID) | ORAL | Status: DC
  Administered 2012-09-21 – 2012-09-24 (×6): 100 mg via ORAL
  Filled 2012-09-21 (×7): qty 1

## 2012-09-21 MED ORDER — LIDOCAINE HCL 4 % MT SOLN
OROMUCOSAL | Status: DC | PRN
  Administered 2012-09-21: 4 mL via TOPICAL

## 2012-09-21 MED ORDER — BISACODYL 10 MG RE SUPP: 10.0000 mg | Freq: Every day | RECTAL | Status: DC | PRN

## 2012-09-21 MED ORDER — ONDANSETRON HCL 4 MG PO TABS: 4.0000 mg | ORAL_TABLET | Freq: Four times a day (QID) | ORAL | Status: DC | PRN

## 2012-09-21 MED ORDER — GLYCOPYRROLATE 0.2 MG/ML IJ SOLN
INTRAMUSCULAR | Status: DC | PRN
  Administered 2012-09-21: .6 mg via INTRAVENOUS

## 2012-09-21 MED ORDER — ENOXAPARIN SODIUM 30 MG/0.3ML ~~LOC~~ SOLN: 30.0000 mg | Freq: Two times a day (BID) | SUBCUTANEOUS | Status: DC

## 2012-09-21 MED ORDER — ACETAMINOPHEN 325 MG PO TABS: 650.0000 mg | ORAL_TABLET | Freq: Four times a day (QID) | ORAL | Status: DC | PRN

## 2012-09-21 SURGICAL SUPPLY — 60 items
BLADE SAW SAG 73X25 THK (BLADE) ×1
BLADE SAW SGTL 73X25 THK (BLADE) ×1 IMPLANT
BRUSH FEMORAL CANAL (MISCELLANEOUS) IMPLANT
CLOTH BEACON ORANGE TIMEOUT ST (SAFETY) ×2 IMPLANT
COVER BACK TABLE 24X17X13 BIG (DRAPES) IMPLANT
COVER SURGICAL LIGHT HANDLE (MISCELLANEOUS) ×2 IMPLANT
DRAPE INCISE IOBAN 66X45 STRL (DRAPES) ×4 IMPLANT
DRAPE ORTHO SPLIT 77X108 STRL (DRAPES) ×2
DRAPE SURG ORHT 6 SPLT 77X108 (DRAPES) ×2 IMPLANT
DRAPE U-SHAPE 47X51 STRL (DRAPES) ×2 IMPLANT
DRSG ADAPTIC 3X8 NADH LF (GAUZE/BANDAGES/DRESSINGS) ×2 IMPLANT
DRSG PAD ABDOMINAL 8X10 ST (GAUZE/BANDAGES/DRESSINGS) ×4 IMPLANT
DURAPREP 26ML APPLICATOR (WOUND CARE) ×2 IMPLANT
ELECT CAUTERY BLADE 6.4 (BLADE) ×4 IMPLANT
ELECT REM PT RETURN 9FT ADLT (ELECTROSURGICAL) ×2
ELECTRODE REM PT RTRN 9FT ADLT (ELECTROSURGICAL) ×1 IMPLANT
EVACUATOR 1/8 PVC DRAIN (DRAIN) IMPLANT
FACESHIELD LNG OPTICON STERILE (SAFETY) ×4 IMPLANT
GLOVE BIOGEL PI IND STRL 6.5 (GLOVE) ×1 IMPLANT
GLOVE BIOGEL PI IND STRL 8 (GLOVE) ×4 IMPLANT
GLOVE BIOGEL PI INDICATOR 6.5 (GLOVE) ×1
GLOVE BIOGEL PI INDICATOR 8 (GLOVE) ×4
GLOVE ORTHO TXT STRL SZ7.5 (GLOVE) ×6 IMPLANT
GLOVE SURG ORTHO 8.0 STRL STRW (GLOVE) ×8 IMPLANT
GLOVE SURG SS PI 7.5 STRL IVOR (GLOVE) ×2 IMPLANT
GOWN PREVENTION PLUS XLARGE (GOWN DISPOSABLE) ×4 IMPLANT
GOWN PREVENTION PLUS XXLARGE (GOWN DISPOSABLE) ×2 IMPLANT
GOWN STRL NON-REIN LRG LVL3 (GOWN DISPOSABLE) ×2 IMPLANT
HANDPIECE INTERPULSE COAX TIP (DISPOSABLE)
IMMOBILIZER KNEE 20 (SOFTGOODS)
IMMOBILIZER KNEE 20 THIGH 36 (SOFTGOODS) IMPLANT
IMMOBILIZER KNEE 22 UNIV (SOFTGOODS) IMPLANT
IMMOBILIZER KNEE 24 THIGH 36 (MISCELLANEOUS) IMPLANT
IMMOBILIZER KNEE 24 UNIV (MISCELLANEOUS)
KIT BASIN OR (CUSTOM PROCEDURE TRAY) ×2 IMPLANT
KIT ROOM TURNOVER OR (KITS) ×2 IMPLANT
MANIFOLD NEPTUNE II (INSTRUMENTS) ×2 IMPLANT
NEEDLE 22X1 1/2 (OR ONLY) (NEEDLE) ×2 IMPLANT
NEEDLE MAYO TROCAR (NEEDLE) ×2 IMPLANT
NS IRRIG 1000ML POUR BTL (IV SOLUTION) ×2 IMPLANT
PACK TOTAL JOINT (CUSTOM PROCEDURE TRAY) ×2 IMPLANT
PAD ARMBOARD 7.5X6 YLW CONV (MISCELLANEOUS) ×4 IMPLANT
PRESSURIZER FEMORAL UNIV (MISCELLANEOUS) IMPLANT
SET HNDPC FAN SPRY TIP SCT (DISPOSABLE) IMPLANT
SPONGE GAUZE 4X4 12PLY (GAUZE/BANDAGES/DRESSINGS) ×2 IMPLANT
STAPLER VISISTAT 35W (STAPLE) ×4 IMPLANT
SUCTION FRAZIER TIP 10 FR DISP (SUCTIONS) ×2 IMPLANT
SUT ETHIBOND NAB CT1 #1 30IN (SUTURE) ×10 IMPLANT
SUT VIC AB 0 CT1 27 (SUTURE) ×1
SUT VIC AB 0 CT1 27XBRD ANBCTR (SUTURE) ×1 IMPLANT
SUT VIC AB 1 CTB1 27 (SUTURE) ×6 IMPLANT
SUT VIC AB 2-0 CT1 27 (SUTURE) ×2
SUT VIC AB 2-0 CT1 TAPERPNT 27 (SUTURE) ×2 IMPLANT
SYR CONTROL 10ML LL (SYRINGE) ×2 IMPLANT
TAPE CLOTH SURG 6X10 WHT LF (GAUZE/BANDAGES/DRESSINGS) ×2 IMPLANT
TOWEL OR 17X24 6PK STRL BLUE (TOWEL DISPOSABLE) ×2 IMPLANT
TOWEL OR 17X26 10 PK STRL BLUE (TOWEL DISPOSABLE) ×2 IMPLANT
TOWER CARTRIDGE SMART MIX (DISPOSABLE) IMPLANT
TRAY FOLEY CATH 14FR (SET/KITS/TRAYS/PACK) ×2 IMPLANT
WATER STERILE IRR 1000ML POUR (IV SOLUTION) ×8 IMPLANT

## 2012-09-21 NOTE — Anesthesia Preprocedure Evaluation (Addendum)
Anesthesia Evaluation  Patient identified by MRN, date of birth, ID band Patient awake    Reviewed: Allergy & Precautions, H&P , NPO status , Patient's Chart, lab work & pertinent test results  Airway Mallampati: II  Neck ROM: full    Dental  (+) Dental Advisory Given and Poor Dentition   Pulmonary          Cardiovascular     Neuro/Psych    GI/Hepatic   Endo/Other    Renal/GU      Musculoskeletal  (+) Arthritis -,   Abdominal   Peds  Hematology   Anesthesia Other Findings   Reproductive/Obstetrics                          Anesthesia Physical Anesthesia Plan  ASA: II  Anesthesia Plan: General   Post-op Pain Management:    Induction: Intravenous  Airway Management Planned: Oral ETT  Additional Equipment:   Intra-op Plan:   Post-operative Plan: Extubation in OR  Informed Consent: I have reviewed the patients History and Physical, chart, labs and discussed the procedure including the risks, benefits and alternatives for the proposed anesthesia with the patient or authorized representative who has indicated his/her understanding and acceptance.     Plan Discussed with: CRNA and Surgeon  Anesthesia Plan Comments:         Anesthesia Quick Evaluation

## 2012-09-21 NOTE — Preoperative (Signed)
Beta Blockers   Reason not to administer Beta Blockers:Not Applicable 

## 2012-09-21 NOTE — Evaluation (Signed)
Physical Therapy Evaluation Patient Details Name: Kenneth Velez MRN: [id number] DOB: July 18, 1951 Today's Date: 09/21/2012 Time: 4098-1191 PT Time Calculation (min): 20 min  PT Assessment / Plan / Recommendation Clinical Impression  Mr. Wallman is a 62 y/o male s/p LTHA.  Pt is doingt well with mobilty and should progress through acute PT quickly.      PT Assessment  Patient needs continued PT services    Follow Up Recommendations  Home health PT    Does the patient have the potential to tolerate intense rehabilitation      Barriers to Discharge None      Equipment Recommendations  Rolling walker with 5" wheels;Other (comment) (3 in 1)    Recommendations for Other Services     Frequency 7X/week    Precautions / Restrictions Precautions Precautions: Posterior Hip Precaution Comments: Educated pt on 3/3 posterior hip precautions.   Required Braces or Orthoses: Knee Immobilizer - Left Knee Immobilizer - Left: Other (comment) (no specific instructions for use. ) Restrictions Weight Bearing Restrictions: Yes   Pertinent Vitals/Pain 3/10 pain in hip.  No intervention required.       Mobility  Bed Mobility Bed Mobility: Supine to Sit;Sitting - Scoot to Edge of Bed Supine to Sit: 4: Min assist;HOB flat;With rails Sitting - Scoot to Edge of Bed: 4: Min assist Details for Bed Mobility Assistance: Assist for LLE Transfers Transfers: Sit to Stand;Stand to Sit Sit to Stand: 4: Min assist;From bed;With upper extremity assist Stand to Sit: 4: Min assist;To chair/3-in-1;With upper extremity assist Details for Transfer Assistance: Cues for techinique assist to steady pt and maintain hip precaution.  Ambulation/Gait Ambulation/Gait Assistance: 4: Min guard Ambulation Distance (Feet): 15 Feet Assistive device: Rolling walker Ambulation/Gait Assistance Details: VC for gait sequencing and WBAT on LLE  Gait Pattern: Step-to pattern Stairs: No Wheelchair Mobility Wheelchair Mobility:  No    Exercises Total Joint Exercises Ankle Circles/Pumps: 10 reps;AROM;Both   PT Diagnosis: Difficulty walking;Abnormality of gait;Acute pain;Generalized weakness  PT Problem List: Decreased range of motion;Decreased mobility;Pain;Decreased strength;Decreased knowledge of precautions;Decreased knowledge of use of DME PT Treatment Interventions: Gait training;DME instruction;Functional mobility training;Therapeutic activities;Therapeutic exercise;Patient/family education   PT Goals Acute Rehab PT Goals PT Goal Formulation: With patient Time For Goal Achievement: 09/28/12 Potential to Achieve Goals: Good Pt will go Supine/Side to Sit: with modified independence PT Goal: Supine/Side to Sit - Progress: Goal set today Pt will go Sit to Supine/Side: with modified independence PT Goal: Sit to Supine/Side - Progress: Goal set today Pt will go Sit to Stand: with modified independence PT Goal: Sit to Stand - Progress: Goal set today Pt will go Stand to Sit: with modified independence PT Goal: Stand to Sit - Progress: Progressing toward goal Pt will Ambulate: 51 - 150 feet;with modified independence;with rolling walker PT Goal: Ambulate - Progress: Goal set today Pt will Perform Home Exercise Program: with supervision, verbal cues required/provided PT Goal: Perform Home Exercise Program - Progress: Goal set today  Visit Information  Last PT Received On: 09/21/12 Assistance Needed: +1    Subjective Data  Subjective: Agree to PT eval Patient Stated Goal: Return to work.    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, nature: No difficulties;Prefers language other than English    Cognition  Cognition Overall Cognitive Status: Appears within functional limits for tasks assessed/performed Arousal/Alertness: Awake/alert Orientation Level: Appears intact for tasks assessed Behavior During Session: [facility] for tasks performed    Extremity/Trunk Assessment Right Lower Extremity Assessment RLE ROM/Strength/Tone: Within functional levels Left Lower Extremity Assessment LLE ROM/Strength/Tone: Unable to fully assess;Due to pain;Due to precautions Trunk Assessment Trunk Assessment: Normal   Balance Balance Balance Assessed: No  End of Session PT - End of Session Equipment Utilized During Treatment: Gait belt;Left knee immobilizer Activity Tolerance: Patient tolerated treatment well Patient left: in chair;with call bell/phone within reach;with family/visitor present Nurse Communication: Mobility status  GP     Symphony Demuro 09/21/2012, 4:14 PM Creg Gilmer L. Wyatt Thorstenson DPT [phone]

## 2012-09-21 NOTE — Op Note (Signed)
NAMEDOMONIC, KIMBALL                ACCOUNT NO.:  [id number]  MEDICAL RECORD NO.:  [id number]  LOCATION:  5N23C                        FACILITY:  [facility]  PHYSICIAN:  Dyke Brackett, M.D.    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  PREOPERATIVE DIAGNOSIS:  Osteoarthritis, left hip.  POSTOPERATIVE DIAGNOSIS:  Osteoarthritis, left hip.  OPERATION:  Left total hip replacement (AML small stature 12 mm stem, +8.5 mm neck length, 36 mm ceramic head ball with 54 mm acetabulum with Marathon +10 degree lip liner).  SURGEON:  Dyke Brackett, M.D.  ASSISTANT:  Margart Sickles, PA-C  ANESTHESIA:  General anesthetic.  BLOOD LOSS:  Approximately 300.  DESCRIPTION OF PROCEDURE:  Sterile prep and drape, in the lateral position, posterior approach to the hip made with splitting of the iliotibial band.  Severe deformity of the femoral head was noted with posterior migration and superior migration of the hip relative to Perthes.  The acetabulum was in hourglass configuration.  We cut the neck right at the level of the lesser trochanter due to the deformity and needing to gain length.  We then progressively reamed and then rasped to a 12 mm stem, elected to get a line to line due to the patient's bone quality being excellent.  The last broach created a small non propagating crack fracture of the calcar area and did not involve the whole lesser trochanter.  This clearly did not propagate based under direct visualization.  There was no false motion noted in the leg when we noted this.  After conclusion of reaming, the femur was directed to hourglass acetabulum and tried to interiorize so to speak the reaming  to prevent posterior migration of the cup and then, we also reamed superiorly to gain better bone purchase and to again get better coverage on the cup.  With this, approximately 45-50 degrees of abduction and 15- 20 degrees anteversion.  Again, we progressively did this up to a 53 reamer to accept a 54 cup.  We then placed the final cup in the trial poly with back and reduced the hip with the final rasp and easily could get the hip reduced.  We then placed the apex screw in the final poly with the 10 degree lip at about the 3 o'clock position.  This was followed by placing the 12 mm stem.  We then trialed off the stem and elected to use a +8.5 mm neck length, which noted excellent stability.  The hip was stable at 90 degrees flexion and full adduction.  We could rotate to 60 degrees where there was a tendency to sublux posteriorly.  Wound was irrigated. Capsule was closed with interrupted Ethibond.  We excluded the fascia with a running #1 Ethibond, the subcu tissues with 2-0 Vicryl and skin clips.  Marcaine with epinephrine was infiltrated in the wound, lightly pressure sterile dressing, knee immobilizer applied.  Taken to recovery in  stable condition.     Dyke Brackett, M.D.     WDC/MEDQ  D:  09/21/2012  T:  09/21/2012  Job:  [id number]

## 2012-09-21 NOTE — Transfer of Care (Signed)
Immediate Anesthesia Transfer of Care Note  Patient: Kenneth Velez  Procedure(s) Performed: Procedure(s): TOTAL HIP ARTHROPLASTY (Left)  Patient Location: PACU  Anesthesia Type:General  Level of Consciousness: awake, alert  and oriented  Airway & Oxygen Therapy: Patient Spontanous Breathing and Patient connected to nasal cannula oxygen  Post-op Assessment: Report given to PACU RN and Post -op Vital signs reviewed and stable  Post vital signs: Reviewed and stable  Complications: No apparent anesthesia complications

## 2012-09-21 NOTE — Anesthesia Procedure Notes (Signed)
Procedure Name: Intubation Performed by: Armandina Gemma Pre-anesthesia Checklist: Patient identified, Emergency Drugs available, Suction available, Patient being monitored and Timeout performed Patient Re-evaluated:Patient Re-evaluated prior to inductionOxygen Delivery Method: Circle system utilized Preoxygenation: Pre-oxygenation with 100% oxygen Intubation Type: IV induction Ventilation: Mask ventilation without difficulty Laryngoscope Size: Miller and 2 Grade View: Grade I Tube size: 7.5 mm Number of attempts: 1 Airway Equipment and Method: Stylet Placement Confirmation: ETT inserted through vocal cords under direct vision,  positive ETCO2 and breath sounds checked- equal and bilateral Secured at: 22 cm Dental Injury: Teeth and Oropharynx as per pre-operative assessment  Comments: Atraumatic teeth as preop

## 2012-09-21 NOTE — Brief Op Note (Signed)
09/21/2012  3:30 PM  PATIENT:  Kenneth Velez  62 y.o. male  PRE-OPERATIVE DIAGNOSIS:  OA right HIP  POST-OPERATIVE DIAGNOSIS:  OA right HIP  PROCEDURE:  Procedure(s): TOTAL HIP ARTHROPLASTY (Left)  SURGEON:  Surgeon(s) and Role:    * W D Carloyn Manner., MD - Primary  PHYSICIAN ASSISTANT:   ASSISTANTS: Margart Sickles, PA-C   ANESTHESIA:   general  EBL:  Total I/O In: 2100 [I.V.:2100] Out: 575 [Urine:225; Blood:350]  BLOOD ADMINISTERED:none  DRAINS: none   LOCAL MEDICATIONS USED:  NONE  SPECIMEN:  No Specimen  DISPOSITION OF SPECIMEN:  N/A  COUNTS:  YES  TOURNIQUET:  * No tourniquets in log *  DICTATION: .Other Dictation: Dictation Number unknown  PLAN OF CARE: Admit to inpatient   PATIENT DISPOSITION:  PACU - hemodynamically stable.   Delay start of Pharmacological VTE agent (>24hrs) due to surgical blood loss or risk of bleeding: yes

## 2012-09-21 NOTE — Plan of Care (Signed)
Problem: Consults Goal: Diagnosis- Total Joint Replacement Primary Total Hip     

## 2012-09-21 NOTE — Interval H&P Note (Signed)
History and Physical Interval Note:  09/21/2012 7:28 AM  Kenneth Velez  has presented today for surgery, with the diagnosis of OA right HIP  The various methods of treatment have been discussed with the patient and family. After consideration of risks, benefits and other options for treatment, the patient has consented to  Procedure(s): TOTAL HIP ARTHROPLASTY (Left) as a surgical intervention .  The patient's history has been reviewed, patient examined, no change in status, stable for surgery.  I have reviewed the patient's chart and labs.  Questions were answered to the patient's satisfaction.     Donnice Nielsen JR,W D

## 2012-09-21 NOTE — Progress Notes (Signed)
Orthopedic Tech Progress Note Patient Details:  Kenneth Velez 02/19/1951 [id number]  [name], male   DOB: [birth year], 62 y.o.   MRN: [id number]  Viewed order from doctor's order list Nikki Dom 09/21/2012, 10:25 PM

## 2012-09-21 NOTE — Progress Notes (Signed)
Orthopedic Tech Progress Note Patient Details:  Kenneth Velez Dec 07, 1950 [id number]  [name], male   DOB: [birth year], 62 y.o.   MRN: [id number] Trapeze bar patient helper  Nikki Dom 09/21/2012, 10:25 PM

## 2012-09-21 NOTE — Anesthesia Postprocedure Evaluation (Signed)
Anesthesia Post Note  Patient: Kenneth Velez  Procedure(s) Performed: Procedure(s) (LRB): TOTAL HIP ARTHROPLASTY (Left)  Anesthesia type: General  Patient location: PACU  Post pain: Pain level controlled and Adequate analgesia  Post assessment: Post-op Vital signs reviewed, Patient's Cardiovascular Status Stable, Respiratory Function Stable, Patent Airway and Pain level controlled  Last Vitals:  Filed Vitals:   09/21/12 1000  BP:   Pulse:   Temp: 36 C  Resp:     Post vital signs: Reviewed and stable  Level of consciousness: awake, alert  and oriented  Complications: No apparent anesthesia complications

## 2012-09-21 NOTE — Progress Notes (Signed)
CARE MANAGEMENT NOTE 09/21/2012  Patient:  Kenneth Velez, Kenneth Velez   Account Number:  [id number]  Date Initiated:  09/21/2012  Documentation initiated by:  Vance Peper  Subjective/Objective Assessment:   62 yr old male s/p left total hip arthroplasty     Action/Plan:   Patient preoperatively setup with [facility], no changes.CM will follow.   Anticipated DC Date:     Anticipated DC Plan:           Choice offered to / List presented to:             Status of service:  In process, will continue to follow Medicare Important Message given?   (If response is "NO", the following Medicare IM given date fields will be blank) Date Medicare IM given:   Date Additional Medicare IM given:    Discharge Disposition:    Per UR Regulation:    If discussed at Long Length of Stay Meetings, dates discussed:    Comments:

## 2012-09-21 NOTE — H&P (View-Only) (Signed)
TOTAL HIP ADMISSION H&P  Patient is admitted for left total hip arthroplasty.  Subjective:  Chief Complaint: left hip pain  HPI: Kenneth Velez, 62 y.o. 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  Onset of symptoms was gradual starting >10 years ago with gradually worsening course since that time.The patient noted no past surgery on the left hip(s).  Patient currently rates pain in the left hip as moderate to severe with activity. Patient has night pain, worsening of pain with activity and weight bearing, pain that interfers with activities of daily living and pain with passive range of motion. Patient has evidence of periarticular osteophytes and joint space narrowing by imaging studies. This condition presents safety issues increasing the risk of falls. There is no current active infection.  There are no active problems to display for this patient.  Past Medical History  Diagnosis Date  . Arthritis     No past surgical history on file.   (Not in a hospital admission) No Known Allergies  History  Substance Use Topics  . Smoking status: Never Smoker   . Smokeless tobacco: Never Used  . Alcohol Use: Yes     Comment: occasional    No family history on file.   Review of Systems  Constitutional: Negative.   HENT: Negative.   Eyes: Positive for blurred vision and redness. Negative for double vision and discharge.  Respiratory: Negative.   Cardiovascular: Negative.   Gastrointestinal: Negative.   Genitourinary: Negative.   Musculoskeletal: Positive for joint pain. Negative for falls.  Skin: Negative.   Neurological: Negative.   Endo/Heme/Allergies: Negative.   Psychiatric/Behavioral: Negative for depression and suicidal ideas.     Objective:  Physical Exam  Constitutional: He is oriented to person, place, and time. He appears well-developed and well-nourished. No distress.  HENT:  Head: Normocephalic and atraumatic.  Nose: Nose normal.  Eyes: EOM are normal. Pupils are equal, round, and reactive to light. Left eye exhibits no discharge.  Neck: Normal range of motion. Neck supple.  Cardiovascular: Normal rate and regular rhythm.   Respiratory: Effort normal and breath sounds normal. No respiratory distress. He has no wheezes. He exhibits no tenderness.  GI: Soft. Bowel sounds are normal. He exhibits no distension. There is no tenderness.  Musculoskeletal:       Left hip: He exhibits decreased range of motion, tenderness and bony tenderness.  Lymphadenopathy:    He has no cervical adenopathy.  Neurological: He is alert and oriented to person, place, and time. No cranial nerve deficit.  Skin: Skin is warm and dry. No rash noted. No erythema.  Psychiatric: He has a normal mood and affect. His behavior is normal.    Vital signs in last 24 hours: @VSRANGES@  Labs:   Estimated body mass index is 29.70 kg/(m^2) as calculated from the following:   Height as of 08/26/12: 5' 5" (1.651 m).   Weight as of an earlier encounter on 09/14/12: 80.967 kg (178 lb 8 oz).   Imaging Review Plain radiographs demonstrate severe degenerative joint disease of the left hip(s). The bone quality appears to be good for age and reported activity level.  Assessment/Plan:  End stage arthritis, left hip(s)  The patient history, physical examination, clinical judgement of the provider and imaging studies are consistent with end   stage degenerative joint disease of the left hip(s) and total hip arthroplasty is deemed medically necessary. The treatment options including medical management, injection therapy, arthroscopy and arthroplasty were discussed at length. The risks and benefits of total hip arthroplasty were presented and reviewed.  The risks due to aseptic loosening, infection, stiffness, dislocation/subluxation,  thromboembolic complications and other imponderables were discussed.  The patient acknowledged the explanation, agreed to proceed with the plan and consent was signed. Patient is being admitted for inpatient treatment for surgery, pain control, PT, OT, prophylactic antibiotics, VTE prophylaxis, progressive ambulation and ADL's and discharge planning.The patient is planning to be discharged home with home health services 

## 2012-09-22 LAB — BASIC METABOLIC PANEL
CO2: 30 mEq/L (ref 19–32)
Chloride: 102 mEq/L (ref 96–112)
Glucose, Bld: 135 mg/dL — ABNORMAL HIGH (ref 70–99)
Potassium: 4 mEq/L (ref 3.5–5.1)
Sodium: 137 mEq/L (ref 135–145)

## 2012-09-22 LAB — CBC
Hemoglobin: 10.9 g/dL — ABNORMAL LOW (ref 13.0–17.0)
RBC: 3.69 MIL/uL — ABNORMAL LOW (ref 4.22–5.81)

## 2012-09-22 MED ORDER — MAGNESIUM HYDROXIDE 400 MG/5ML PO SUSP: ORAL | Status: DC

## 2012-09-22 MED ORDER — DSS 100 MG PO CAPS: ORAL_CAPSULE | ORAL | Status: DC

## 2012-09-22 MED ORDER — METOCLOPRAMIDE HCL 5 MG PO TABS
5.0000 mg | ORAL_TABLET | Freq: Four times a day (QID) | ORAL | Status: DC
  Administered 2012-09-23 (×2): 10 mg via ORAL
  Filled 2012-09-22 (×11): qty 2

## 2012-09-22 MED ORDER — MAGNESIUM HYDROXIDE 400 MG/5ML PO SUSP
30.0000 mL | Freq: Every day | ORAL | Status: AC
  Administered 2012-09-22 – 2012-09-23 (×2): 30 mL via ORAL
  Filled 2012-09-22 (×3): qty 30

## 2012-09-22 MED ORDER — METOCLOPRAMIDE HCL 5 MG/ML IJ SOLN
5.0000 mg | Freq: Four times a day (QID) | INTRAMUSCULAR | Status: DC
  Administered 2012-09-22 – 2012-09-24 (×5): 10 mg via INTRAVENOUS
  Filled 2012-09-22 (×14): qty 2

## 2012-09-22 NOTE — Evaluation (Signed)
Occupational Therapy Evaluation Patient Details Name: Kenneth Velez MRN: [id number] DOB: 09-14-1950 Today's Date: 09/22/2012 Time: 4098-1191 OT Time Calculation (min): 24 min  OT Assessment / Plan / Recommendation Clinical Impression  Pt s/p L THA thus affecting PLOF. Will benefit from acute OT services to address below problem list in prep for return home with wife.    OT Assessment  Patient needs continued OT Services    Follow Up Recommendations  Home health OT;Supervision/Assistance - 24 hour    Barriers to Discharge None    Equipment Recommendations  3 in 1 bedside comode    Recommendations for Other Services    Frequency  Min 2X/week    Precautions / Restrictions Precautions Precautions: Posterior Hip Precaution Comments: Reviewed hip precautions Required Braces or Orthoses: Knee Immobilizer - Left Knee Immobilizer - Left: Other (comment) (no specific instructions for use) Restrictions LLE Weight Bearing: Weight bearing as tolerated   Pertinent Vitals/Pain See vitals    ADL  Grooming: Performed;Wash/dry hands;Min guard Where Assessed - Grooming: Unsupported standing Upper Body Bathing: Simulated;Set up Where Assessed - Upper Body Bathing: Unsupported sitting Lower Body Bathing: Simulated;Maximal assistance Where Assessed - Lower Body Bathing: Supported sit to stand Upper Body Dressing: Simulated;Set up Where Assessed - Upper Body Dressing: Unsupported sitting Lower Body Dressing: Performed;+1 Total assistance Where Assessed - Lower Body Dressing: Supported sit to stand Toilet Transfer: Simulated;Minimal assistance Toilet Transfer Method: Sit to Barista:  (bed) Equipment Used: Gait belt;Rolling walker;Reacher Transfers/Ambulation Related to ADLs: min guard with RW for ambulation in room and hall ADL Comments: Pt too fatigued after ambulation and bed mobility to focus on AE.  Pt reports he will have wife to assist but will review AE next  session to ensure pt is aware of possible techniques/strategies for ADLs. Pt unable to tolerate sitting EOB for long due to left hip pain.    OT Diagnosis: Generalized weakness;Acute pain  OT Problem List: Decreased strength;Decreased activity tolerance;Decreased knowledge of use of DME or AE;Decreased knowledge of precautions;Pain OT Treatment Interventions: Self-care/ADL training;DME and/or AE instruction;Therapeutic activities;Patient/family education   OT Goals Acute Rehab OT Goals OT Goal Formulation: With patient Time For Goal Achievement: 09/22/12 Potential to Achieve Goals: Good ADL Goals Pt Will Perform Lower Body Bathing: with min assist;Sit to stand from chair;Sit to stand from bed;with adaptive equipment (caregiver independent in assisting) ADL Goal: Lower Body Bathing - Progress: Goal set today Pt Will Perform Lower Body Dressing: with min assist;Sit to stand from chair;Sit to stand from bed;with adaptive equipment (caregiver independent in assisting) ADL Goal: Lower Body Dressing - Progress: Goal set today Pt Will Transfer to Toilet: with supervision;Ambulation;with DME;Comfort height toilet;Maintaining hip precautions ADL Goal: Toilet Transfer - Progress: Goal set today Pt Will Perform Toileting - Clothing Manipulation: with supervision;Sitting on 3-in-1 or toilet;Standing ADL Goal: Toileting - Clothing Manipulation - Progress: Goal set today Pt Will Perform Tub/Shower Transfer: Tub transfer;with min assist;Ambulation;with DME;Shower seat with back;Maintaining hip precautions ADL Goal: Tub/Shower Transfer - Progress: Goal set today Miscellaneous OT Goals Miscellaneous OT Goal #1: Pt will perform bed mobility at supervision level while maintaining posterior hip precautions. OT Goal: Miscellaneous Goal #1 - Progress: Goal set today  Visit Information  Last OT Received On: 09/22/12 Assistance Needed: +1 PT/OT Co-Evaluation/Treatment: Yes    Subjective Data      Prior  Functioning     Home Living Lives With: Family Available Help at Discharge: Family;Available 24 hours/day Type of Home: House Home Access: Level entry Home  Layout: One level Bathroom Shower/Tub: Counselling psychologist: Yes How Accessible: Accessible via walker Home Adaptive Equipment: None Prior Function Level of Independence: Independent Able to Take Stairs?: Yes Driving: Yes Vocation: Full time employment Comments: Animator Communication: No difficulties;Prefers language other than English         Vision/Perception     Cognition  Cognition Overall Cognitive Status: Appears within functional limits for tasks assessed/performed Arousal/Alertness: Awake/alert Orientation Level: Appears intact for tasks assessed Behavior During Session: [facility] for tasks performed    Extremity/Trunk Assessment Right Upper Extremity Assessment RUE ROM/Strength/Tone: [facility] for tasks assessed Left Upper Extremity Assessment LUE ROM/Strength/Tone: [facility] for tasks assessed     Mobility Bed Mobility Bed Mobility: Supine to Sit;Sitting - Scoot to Edge of Bed;Sit to Supine Supine to Sit: 4: Min assist;HOB flat Sitting - Scoot to Edge of Bed: 4: Min assist Sit to Supine: 4: Min assist Details for Bed Mobility Assistance: Cues for sequencing, technique, & use of UE's.  Pt cont's to have difficulty tolerating wt through Lt hip with pivoting around to EOB & scooting closer to EOB.    Transfers Transfers: Sit to Stand;Stand to Sit Sit to Stand: 4: Min assist;From bed Stand to Sit: 4: Min guard;With upper extremity assist;To bed Details for Transfer Assistance: (A) to steady pt as he was leaning to Rt due to decreased WBing through Lt LE.       Exercise     Balance     End of Session OT - End of Session Equipment Utilized During Treatment: Gait belt Activity Tolerance: Patient limited by fatigue;Patient limited by  pain Patient left: in bed;with family/visitor present Nurse Communication: Mobility status;Patient requests pain meds  GO    09/22/2012 Cipriano Mile OTR/L Pager [phone] Office [phone]  Cipriano Mile 09/22/2012, 3:52 PM

## 2012-09-22 NOTE — Progress Notes (Signed)
Physical Therapy Treatment Patient Details Name: Kenneth Velez MRN: [id number] DOB: 1951-01-05 Today's Date: 09/22/2012 Time: 0257-0320 PT Time Calculation (min): 23 min  PT Assessment / Plan / Recommendation Comments on Treatment Session  Pt cont's to have diffriculty with bed mobility due to Lt hip pain but does well once OOB.       Follow Up Recommendations  Home health PT     Does the patient have the potential to tolerate intense rehabilitation     Barriers to Discharge        Equipment Recommendations  Rolling walker with 5" wheels;Other (comment)    Recommendations for Other Services    Frequency 7X/week   Plan Discharge plan remains appropriate    Precautions / Restrictions Precautions Precautions: Posterior Hip Precaution Comments: Reviewed hip precautions Required Braces or Orthoses: Knee Immobilizer - Left Knee Immobilizer - Left: Other (comment) (no specific instructions for use) Restrictions LLE Weight Bearing: Weight bearing as tolerated   Pertinent Vitals/Pain 7/10  Lt hip.  RN notified for pain medication    Mobility  Bed Mobility Bed Mobility: Supine to Sit;Sitting - Scoot to Edge of Bed;Sit to Supine Supine to Sit: 4: Min assist;HOB flat Sitting - Scoot to Edge of Bed: 4: Min assist Sit to Supine: 4: Min assist Details for Bed Mobility Assistance: Cues for sequencing, technique, & use of UE's.  Pt cont's to have difficulty tolerating wt through Lt hip with pivoting around to EOB & scooting closer to EOB.    Transfers Transfers: Sit to Stand;Stand to Sit Sit to Stand: 4: Min assist;From bed Stand to Sit: 4: Min guard;With upper extremity assist;To bed Details for Transfer Assistance: (A) to steady pt as he was leaning to Rt due to decreased WBing through Lt LE.   Ambulation/Gait Ambulation/Gait Assistance: 4: Min guard Ambulation Distance (Feet): 150 Feet Assistive device: Rolling walker Ambulation/Gait Assistance Details: Cues for body positioning  inside RW,  smooth advancement of RW, decrease reliance of UE's on RW.   Gait Pattern: Step-to pattern;Decreased stance time - left;Decreased step length - right;Decreased weight shift to left General Gait Details: Pt beginning to progress to step-through gait pattern.  Stairs: No Wheelchair Mobility Wheelchair Mobility: No      PT Goals Acute Rehab PT Goals Time For Goal Achievement: 09/28/12 Potential to Achieve Goals: Good Pt will go Supine/Side to Sit: with modified independence PT Goal: Supine/Side to Sit - Progress: Not progressing Pt will go Sit to Supine/Side: with modified independence PT Goal: Sit to Supine/Side - Progress: Progressing toward goal Pt will go Sit to Stand: with modified independence PT Goal: Sit to Stand - Progress: Progressing toward goal Pt will go Stand to Sit: with modified independence PT Goal: Stand to Sit - Progress: Progressing toward goal Pt will Ambulate: 51 - 150 feet;with modified independence;with rolling walker PT Goal: Ambulate - Progress: Progressing toward goal Pt will Perform Home Exercise Program: with supervision, verbal cues required/provided  Visit Information  Last PT Received On: 09/22/12 Assistance Needed: +1    Subjective Data      Cognition  Cognition Overall Cognitive Status: Appears within functional limits for tasks assessed/performed Arousal/Alertness: Awake/alert Orientation Level: Appears intact for tasks assessed Behavior During Session: [name] for tasks performed    Balance     End of Session PT - End of Session Equipment Utilized During Treatment: Gait belt Activity Tolerance: Patient tolerated treatment well Patient left: in bed;with call bell/phone within reach;with family/visitor present Nurse Communication: Mobility status;Patient requests pain meds  Verdell Face, [place] [phone] 09/22/2012

## 2012-09-22 NOTE — Progress Notes (Signed)
Physical Therapy Treatment Patient Details Name: Kenneth Velez MRN: [id number] DOB: 07-Feb-1951 Today's Date: 09/22/2012 Time: 3875-6433 PT Time Calculation (min): 28 min  PT Assessment / Plan / Recommendation Comments on Treatment Session  Pt progressing well with mobility & PT goals at this time.      Follow Up Recommendations  Home health PT     Does the patient have the potential to tolerate intense rehabilitation     Barriers to Discharge        Equipment Recommendations  Rolling walker with 5" wheels;Other (comment)    Recommendations for Other Services    Frequency 7X/week   Plan Discharge plan remains appropriate    Precautions / Restrictions Precautions Precautions: Posterior Hip Precaution Comments: Pt re-educated on all 3 hip precautions.  Required cueing to reinforce "No internal rotation" with supine>sit as well as static sitting EOB.   Required Braces or Orthoses: Knee Immobilizer - Left Knee Immobilizer - Left: Other (comment) (no specific instructions for use) Restrictions Weight Bearing Restrictions: Yes LLE Weight Bearing: Weight bearing as tolerated   Pertinent Vitals/Pain 5/10 Lt Hip.  Premedicated.      Mobility  Bed Mobility Bed Mobility: Supine to Sit;Sitting - Scoot to Edge of Bed Supine to Sit: 4: Min assist;HOB flat Sitting - Scoot to [place] of Bed: 4: Min assist Details for Bed Mobility Assistance: Cues for "no internal rotation", cues for use of UE's to increase ease of transition, & to evenly distribute weight through hips while sitting EOB.   (A) to lift torso to sitting upright & to manage L LE.   Transfers Transfers: Sit to Stand;Stand to Sit Sit to Stand: 4: Min guard;With upper extremity assist;From bed Stand to Sit: 4: Min guard;With upper extremity assist;To chair/3-in-1;With armrests Details for Transfer Assistance: Cues for hand placement & technique.   Ambulation/Gait Ambulation/Gait Assistance: 4: Min guard Ambulation Distance  (Feet): 60 Feet Assistive device: Rolling walker Ambulation/Gait Assistance Details: cues for sequencing, tall posture, increased WBing through L LE.    Gait Pattern: Step-to pattern;Decreased step length - right;Decreased weight shift to left;Antalgic General Gait Details: Pt beginning to progress to step-through gait pattern.  Stairs: No Wheelchair Mobility Wheelchair Mobility: No    Exercises Total Joint Exercises Ankle Circles/Pumps: AROM;Both;10 reps Gluteal Sets: AROM;Both;10 reps Heel Slides: AAROM;Left;5 reps Long Arc Quad: AROM;Left;5 reps Marching in Standing: AROM;Both;10 reps;Standing     PT Goals Acute Rehab PT Goals Time For Goal Achievement: 09/28/12 Potential to Achieve Goals: Good Pt will go Supine/Side to Sit: with modified independence PT Goal: Supine/Side to Sit - Progress: Progressing toward goal Pt will go Sit to Supine/Side: with modified independence Pt will go Sit to Stand: with modified independence PT Goal: Sit to Stand - Progress: Progressing toward goal Pt will go Stand to Sit: with modified independence PT Goal: Stand to Sit - Progress: Progressing toward goal Pt will Ambulate: 51 - 150 feet;with modified independence;with rolling walker PT Goal: Ambulate - Progress: Progressing toward goal Pt will Perform Home Exercise Program: with supervision, verbal cues required/provided PT Goal: Perform Home Exercise Program - Progress: Progressing toward goal  Visit Information  Last PT Received On: 09/22/12 Assistance Needed: +1    Subjective Data  Patient Stated Goal: Return to work.    Cognition  Cognition Overall Cognitive Status: Appears within functional limits for tasks assessed/performed Arousal/Alertness: Awake/alert Orientation Level: Appears intact for tasks assessed Behavior During Session: [facility] for tasks performed    Balance     End of Session  PT - End of Session Equipment Utilized During Treatment: Gait belt Activity Tolerance:  Patient tolerated treatment well Patient left: in chair;with call bell/phone within reach Nurse Communication: Mobility status     Verdell Face, [place] [phone] 09/22/2012

## 2012-09-22 NOTE — Progress Notes (Signed)
[name], male   DOB: 1951/04/28, 62 y.o.   MRN: [id number] [name]  MRN: [id number]  DOB/AGE:  [birth year] / 62 y.o.  1 Day Post-Op Procedure(s) (LRB): TOTAL HIP ARTHROPLASTY (Left)    PROGRESS NOTE Subjective: Patient is alert, oriented,no Nausea, no Vomiting, yes passing gas, no Bowel Movement. Taking PO wekk. Denies SOB, Chest or Calf Pain. Using Incentive Spirometer, PAS in place. Ambulate 1+assist   Doing well Patient reports pain as 5 on 0-10 scale  .    Objective: Vital signs in last 24 hours: Filed Vitals:   09/21/12 2136 09/22/12 0035 09/22/12 0550 09/22/12 0800  BP: 143/67 151/70 138/70   Pulse: 87 98 99   Temp: 98.2 F (36.8 C) 98.7 F (37.1 C) 98.4 F (36.9 C)   TempSrc:  Oral    Resp: 16 16 18 16   Height:      Weight:      SpO2: 99% 93% 100% 100%      Intake/Output from previous day: I/O last 3 completed shifts: In: 3370 [P.O.:360; I.V.:3010] Out: 2575 [Urine:2225; Blood:350]   Intake/Output this shift:     LABORATORY DATA:  Recent Labs  09/21/12 1200 09/22/12 0603  WBC 22.5* 7.7  HGB 12.7* 10.9*  HCT 37.2* 32.2*  PLT 285 234  NA  --  137  K  --  4.0  CL  --  102  CO2  --  30  BUN  --  7  CREATININE 0.81 0.80  GLUCOSE  --  135*  CALCIUM  --  8.8    Examination: Neurologically intact ABD soft Neurovascular intact Sensation intact distally Intact pulses distally Dorsiflexion/Plantar flexion intact Incision: scant drainage} XR AP&Lat of hip shows well placed\fixed THA  Assessment:   1 Day Post-Op Procedure(s) (LRB): TOTAL HIP ARTHROPLASTY (Left) ADDITIONAL DIAGNOSIS:  Acute Blood Loss Anemia  Plan: PT/OT WBAT, THA  posterior precautions  DVT Prophylaxis: SCDx72 hrs, Lovenox  DISCHARGE PLAN: Home tomorrow  DISCHARGE NEEDS: HHPT, Walker and 3-in-1 comode seat   Velita Quirk A. Gwinda Passe Physician Assistant [facility] [phone]  09/22/2012, 8:49 AM

## 2012-09-23 ENCOUNTER — Inpatient Hospital Stay (HOSPITAL_COMMUNITY): Payer: Managed Care, Other (non HMO)

## 2012-09-23 LAB — CBC
Hemoglobin: 10.7 g/dL — ABNORMAL LOW (ref 13.0–17.0)
MCH: 29.9 pg (ref 26.0–34.0)
MCV: 86 fL (ref 78.0–100.0)
RBC: 3.58 MIL/uL — ABNORMAL LOW (ref 4.22–5.81)

## 2012-09-23 NOTE — Progress Notes (Signed)
Physical Therapy Treatment Patient Details Name: Kenneth Velez MRN: [id number] DOB: 09-15-1950 Today's Date: 09/23/2012 Time: 4098-1191 PT Time Calculation (min): 23 min  PT Assessment / Plan / Recommendation Comments on Treatment Session  Pt still c/o L hip pain but states it's better controlled at this time.  Pt's wife present entire session & was educated on how to (A) with bed mobility.      Follow Up Recommendations  Home health PT     Does the patient have the potential to tolerate intense rehabilitation     Barriers to Discharge        Equipment Recommendations  Rolling walker with 5" wheels;Other (comment)    Recommendations for Other Services    Frequency 7X/week   Plan Discharge plan remains appropriate    Precautions / Restrictions Precautions Precautions: Posterior Hip Precaution Comments: improving with maintaining compliance with hip precautions with mobility but still requires min cueing with bed mobility Restrictions LLE Weight Bearing: Weight bearing as tolerated       Mobility  Bed Mobility Bed Mobility: Supine to Sit;Sitting - Scoot to Edge of Bed;Sit to Supine Supine to Sit: 4: Min assist;HOB flat Sitting - Scoot to [place] of Bed: 4: Min assist Sit to Supine: 4: Min assist;HOB flat Details for Bed Mobility Assistance: Pt's wife present & (A)'d with supine<>sit.  Educated his wife on proper/safe handling to (A).   Transfers Transfers: Sit to Stand;Stand to Sit Sit to Stand: 4: Min guard;With upper extremity assist;From bed Stand to Sit: With upper extremity assist;5: Supervision;To bed Details for Transfer Assistance: cues to evenly distribute weight through bil LE's.  Still has difficulty using LLE evenly due to pain Ambulation/Gait Ambulation/Gait Assistance: 4: Min guard Ambulation Distance (Feet): 150 Feet Assistive device: Rolling walker Ambulation/Gait Assistance Details: Focus of gait training was to increase fluidity/smoothness of gait pattern +  RW advancement, & to increase WBing through L LE during stance phase Gait Pattern: Step-to pattern;Step-through pattern;Decreased stride length;Decreased step length - right;Decreased weight shift to left;Decreased hip/knee flexion - left Stairs: No Wheelchair Mobility Wheelchair Mobility: No     PT Goals Acute Rehab PT Goals Time For Goal Achievement: 09/28/12 Potential to Achieve Goals: Good Pt will go Supine/Side to Sit: with modified independence PT Goal: Supine/Side to Sit - Progress: Progressing toward goal Pt will go Sit to Supine/Side: with modified independence PT Goal: Sit to Supine/Side - Progress: Progressing toward goal Pt will go Sit to Stand: with modified independence PT Goal: Sit to Stand - Progress: Progressing toward goal Pt will go Stand to Sit: with modified independence PT Goal: Stand to Sit - Progress: Progressing toward goal Pt will Ambulate: 51 - 150 feet;with modified independence;with rolling walker PT Goal: Ambulate - Progress: Progressing toward goal Pt will Perform Home Exercise Program: with supervision, verbal cues required/provided  Visit Information  Last PT Received On: 09/23/12 Assistance Needed: +1    Subjective Data      Cognition  Cognition Overall Cognitive Status: Appears within functional limits for tasks assessed/performed Arousal/Alertness: Awake/alert Orientation Level: Appears intact for tasks assessed Behavior During Session: [facility] for tasks performed    Balance     End of Session PT - End of Session Equipment Utilized During Treatment: Gait belt Activity Tolerance: Patient tolerated treatment well Patient left: in bed;with call bell/phone within reach;with family/visitor present Nurse Communication: Mobility status     Verdell Face, [place] [phone] 09/23/2012

## 2012-09-23 NOTE — Progress Notes (Signed)
[name], male   DOB: March 25, 1951, 62 y.o.   MRN: [id number] [name]  MRN: [id number]  DOB/AGE:  Feb 09, 1951 / 62 y.o.  2 Days Post-Op Procedure(s) (LRB): TOTAL HIP ARTHROPLASTY (Left)    PROGRESS NOTE Subjective: Patient is alert, oriented,no Nausea, no Vomiting, yes passing gas, no Bowel Movement. Taking PO well. Denies SOB, Chest or Calf Pain. Using Incentive Spirometer, PAS in place. Ambulate well but bed mobiltiy is very difficult.  Patient is complaining of a lot of pain Patient reports pain as 8 on 0-10 scale  .    Objective: Vital signs in last 24 hours: Filed Vitals:   09/22/12 1242 09/22/12 1529 09/22/12 2155 09/23/12 0621  BP: 157/68  161/71 164/73  Pulse: 97  90 100  Temp: 98.3 F (36.8 C)  98.7 F (37.1 C) 99.4 F (37.4 C)  TempSrc: Oral     Resp: 18 18 18 18   Height:      Weight:      SpO2: 99%  96% 96%      Intake/Output from previous day: I/O last 3 completed shifts: In: 2950 [P.O.:2040; I.V.:910] Out: 2900 [Urine:2900]   Intake/Output this shift: Total I/O In: 480 [P.O.:480] Out: -    LABORATORY DATA:  Recent Labs  09/21/12 1200 09/22/12 0603 09/23/12 0619  WBC 22.5* 7.7 11.8*  HGB 12.7* 10.9* 10.7*  HCT 37.2* 32.2* 30.8*  PLT 285 234 232  NA  --  137  --   K  --  4.0  --   CL  --  102  --   CO2  --  30  --   BUN  --  7  --   CREATININE 0.81 0.80  --   GLUCOSE  --  135*  --   CALCIUM  --  8.8  --     Examination: Neurovascular intact Sensation intact distally Intact pulses distally Dorsiflexion/Plantar flexion intact Incision: moderate drainage Left thigh very tense and swollen  Significant left thigh pain} Abdomen distended bowel sounds present XR AP&Lat of hip shows well placed\fixed THA  Assessment:   2 Days Post-Op Procedure(s) (LRB): TOTAL HIP ARTHROPLASTY (Left) ADDITIONAL DIAGNOSIS:  Acute Blood Loss Anemia constipation  Plan: PT/OT WBAT, THA  posterior precautions  DVT Prophylaxis:  SCDx72 hrs, Lovenox  DISCHARGE PLAN: Home  DISCHARGE NEEDS: HHPT, Walker and 3-in-1 comode seat   Will see how this patient does in therapy today.  If he does well he can d/c home   Kenneth Velez A. Gwinda Passe Physician Assistant [facility] [phone]  09/23/2012, 8:02 AM

## 2012-09-23 NOTE — Progress Notes (Signed)
Physical Therapy Treatment Patient Details Name: Kenneth Velez MRN: [id number] DOB: 1950-09-29 Today's Date: 09/23/2012 Time: 1308-6578 PT Time Calculation (min): 28 min  PT Assessment / Plan / Recommendation Comments on Treatment Session  Pt c/o significant pain in Lt hip/thigh.  Pt cont's to require min (A) + mod cueing for bed mobility but transitioning was smoother (pt using UE's better today).       Follow Up Recommendations  Home health PT     Does the patient have the potential to tolerate intense rehabilitation     Barriers to Discharge        Equipment Recommendations  Rolling walker with 5" wheels;Other (comment)    Recommendations for Other Services    Frequency 7X/week   Plan Discharge plan remains appropriate    Precautions / Restrictions Precautions Precautions: Posterior Hip Precaution Comments: Pt able to recall 3/3 hip precautions but requires cueing to reinforce "no internal rotation" with bed mobility Required Braces or Orthoses: Knee Immobilizer - Left Knee Immobilizer - Left: Other (comment) (no specific instructions for use) Restrictions LLE Weight Bearing: Weight bearing as tolerated   Pertinent Vitals/Pain C/o a lot of pain with activity but did not rate.  At rest, "it's calm".   Pt premedicated prior to sessio.      Mobility  Bed Mobility Bed Mobility: Supine to Sit;Sitting - Scoot to [place] of Bed;Sit to Supine Supine to Sit: 4: Min assist;HOB flat Sitting - Scoot to [place] of Bed: 4: Min assist Sit to Supine: 4: Min assist;HOB flat Details for Bed Mobility Assistance: Cont's to require (A) for L LE however pt did much better with using UE's to (A) with ease of transitional movements.  Cues for sequencing & technique.   Transfers Transfers: Sit to Stand;Stand to Sit Sit to Stand: 4: Min guard;With upper extremity assist;With armrests;From bed;From chair/3-in-1 Stand to Sit: 4: Min guard;With upper extremity assist;With armrests;To bed;To  chair/3-in-1 Details for Transfer Assistance: Cues for L LE positioning, & use of UE's to control descent.   Pt leans strong to Rt side Ambulation/Gait Ambulation/Gait Assistance: 4: Min guard Ambulation Distance (Feet): 100 Feet Assistive device: Rolling walker Ambulation/Gait Assistance Details: Cues for tall posture, increase wt shifting to Lt with Rt swing phase, & reinforcement of hip precautions with turns.   Gait Pattern: Step-to pattern;Decreased stance time - left;Decreased step length - right;Decreased hip/knee flexion - left;Decreased weight shift to left;Antalgic Stairs: No Wheelchair Mobility Wheelchair Mobility: No    Exercises Total Joint Exercises Ankle Circles/Pumps: AROM;Both;10 reps Gluteal Sets: AROM;Both;10 reps Heel Slides: AAROM;Left;10 reps Hip ABduction/ADduction: AAROM;Left;10 reps Marching in Standing: AAROM;Left;10 reps;Seated;Other (comment) (sitting EOB)     PT Goals Acute Rehab PT Goals Time For Goal Achievement: 09/28/12 Potential to Achieve Goals: Good Pt will go Supine/Side to Sit: with modified independence PT Goal: Supine/Side to Sit - Progress: Progressing toward goal Pt will go Sit to Supine/Side: with modified independence PT Goal: Sit to Supine/Side - Progress: Progressing toward goal Pt will go Sit to Stand: with modified independence PT Goal: Sit to Stand - Progress: Progressing toward goal Pt will go Stand to Sit: with modified independence PT Goal: Stand to Sit - Progress: Progressing toward goal Pt will Ambulate: 51 - 150 feet;with modified independence;with rolling walker PT Goal: Ambulate - Progress: Progressing toward goal Pt will Perform Home Exercise Program: with supervision, verbal cues required/provided PT Goal: Perform Home Exercise Program - Progress: Progressing toward goal  Visit Information  Last PT Received On: 09/23/12 Assistance Needed: +  1    Subjective Data  Subjective: "It's so heavy"   Cognition   Cognition Overall Cognitive Status: Appears within functional limits for tasks assessed/performed Arousal/Alertness: Awake/alert Orientation Level: Appears intact for tasks assessed Behavior During Session: [facility] for tasks performed    Balance     End of Session PT - End of Session Equipment Utilized During Treatment: Gait belt Activity Tolerance: Patient tolerated treatment well;Patient limited by pain Patient left: in chair;with call bell/phone within reach Nurse Communication: Mobility status     Verdell Face, [place] [phone] 09/23/2012

## 2012-09-24 ENCOUNTER — Encounter (HOSPITAL_COMMUNITY): Payer: Self-pay | Admitting: Orthopedic Surgery

## 2012-09-24 LAB — CBC
HCT: 30.1 % — ABNORMAL LOW (ref 39.0–52.0)
Hemoglobin: 10.3 g/dL — ABNORMAL LOW (ref 13.0–17.0)
MCHC: 34.2 g/dL (ref 30.0–36.0)
MCV: 86.7 fL (ref 78.0–100.0)
RDW: 12.6 % (ref 11.5–15.5)

## 2012-09-24 NOTE — Progress Notes (Signed)
Subjective: 3 Days Post-Op Procedure(s) (LRB): TOTAL HIP ARTHROPLASTY (Left) Patient reports pain as moderate.    Objective: Vital signs in last 24 hours: Temp:  [99.1 F (37.3 C)-100.2 F (37.9 C)] 99.1 F (37.3 C) (02/24 0710) Pulse Rate:  [89-103] 89 (02/24 0710) Resp:  [16-18] 16 (02/24 0710) BP: (149-158)/(67-72) 149/67 mmHg (02/24 0710) SpO2:  [95 %-99 %] 95 % (02/24 0710)  Intake/Output from previous day: 02/23 0701 - 02/24 0700 In: 1440 [P.O.:1440] Out: 650 [Urine:650] Intake/Output this shift:     Recent Labs  09/21/12 1200 09/22/12 0603 09/23/12 0619 09/24/12 0700  HGB 12.7* 10.9* 10.7* 10.3*    Recent Labs  09/23/12 0619 09/24/12 0700  WBC 11.8* 10.3  RBC 3.58* 3.47*  HCT 30.8* 30.1*  PLT 232 242    Recent Labs  09/21/12 1200 09/22/12 0603  NA  --  137  K  --  4.0  CL  --  102  CO2  --  30  BUN  --  7  CREATININE 0.81 0.80  GLUCOSE  --  135*  CALCIUM  --  8.8   No results found for this basename: LABPT, INR,  in the last 72 hours  Neurovascular intact Sensation intact distally Intact pulses distally Dorsiflexion/Plantar flexion intact Incision: dressing C/D/I and no drainage No cellulitis present Compartment soft Palpable hematoma surrounding incision, no erythema, or warmth, swelling in leg to approx the knee, no calf tenderness or swelling   Assessment/Plan: 3 Days Post-Op Procedure(s) (LRB): TOTAL HIP ARTHROPLASTY (Left) Up with therapy Discharge home with home health after his PT today lovenox teaching for post d/c home   Margart Sickles 09/24/2012, 8:59 AM

## 2012-09-24 NOTE — Progress Notes (Signed)
CARE MANAGEMENT NOTE 09/24/2012  Patient:  Kenneth Velez, Kenneth Velez   Account Number:  [id number]  Date Initiated:  09/21/2012  Documentation initiated by:  Vance Peper  Subjective/Objective Assessment:   62 yr old male s/p left total hip arthroplasty     Action/Plan:   Patient preoperatively setup with [facility], no changes.CM will follow.  09/24/12 CM spoke with patient concerning home health needs and DME. States he has rolling walker and 3in1.   Anticipated DC Date:  09/24/2012   Anticipated DC Plan:  HOME W HOME HEALTH SERVICES      DC Planning Services  CM consult      [facility] Choice  HOME HEALTH   Choice offered to / List presented to:  [name]        HH arranged  HH-2 PT      [facility] agency  [facility]   Status of service:  Completed, signed off Medicare Important Message given?   (If response is "NO", the following Medicare IM given date fields will be blank) Date Medicare IM given:   Date Additional Medicare IM given:    Discharge Disposition:  HOME W HOME HEALTH SERVICES  Per UR Regulation:    If discussed at Long Length of Stay Meetings, dates discussed:    Comments:

## 2012-09-24 NOTE — Progress Notes (Signed)
Physical Therapy Treatment Patient Details Name: Floy Riegler MRN: [id number] DOB: [birth year] Today's Date: 09/24/2012 Time: 3086-5784 PT Time Calculation (min): 27 min  PT Assessment / Plan / Recommendation Comments on Treatment Session  Patient progressing well. Anticipate discharge to home today    Follow Up Recommendations  Home health PT     Does the patient have the potential to tolerate intense rehabilitation     Barriers to Discharge        Equipment Recommendations  Rolling walker with 5" wheels;Other (comment)    Recommendations for Other Services    Frequency 7X/week   Plan Discharge plan remains appropriate    Precautions / Restrictions Precautions Precautions: Posterior Hip Precaution Comments: improving with maintaining compliance with hip precautions with mobility but still requires min cueing with bed mobility Restrictions Weight Bearing Restrictions: Yes   Pertinent Vitals/Pain     Mobility  Bed Mobility Supine to Sit: 5: Supervision Sitting - Scoot to Edge of Bed: 5: Supervision Transfers Sit to Stand: 5: Supervision;With upper extremity assist;From bed Stand to Sit: 5: Supervision;With upper extremity assist;To chair/3-in-1;With armrests Details for Transfer Assistance: Cues for safe hand placement Ambulation/Gait Ambulation/Gait Assistance: 5: Supervision Ambulation Distance (Feet): 150 Feet Assistive device: Rolling walker Ambulation/Gait Assistance Details: Cues to increase weight on LEs as tolerated and to increase gait speed as tolerated Gait Pattern: Step-to pattern;Step-through pattern;Decreased stride length;Decreased step length - right;Decreased weight shift to left;Decreased hip/knee flexion - left Gait velocity: decreased    Exercises Total Joint Exercises Heel Slides: AAROM;Left;10 reps Hip ABduction/ADduction: AAROM;Left;10 reps Long Arc Quad: AROM;Left;10 reps   PT Diagnosis:    PT Problem List:   PT Treatment Interventions:      PT Goals Acute Rehab PT Goals PT Goal: Supine/Side to Sit - Progress: Progressing toward goal PT Goal: Sit to Stand - Progress: Progressing toward goal PT Goal: Stand to Sit - Progress: Progressing toward goal PT Goal: Ambulate - Progress: Progressing toward goal PT Goal: Perform Home Exercise Program - Progress: Progressing toward goal  Visit Information  Last PT Received On: 09/24/12 Assistance Needed: +1    Subjective Data      Cognition  Cognition Overall Cognitive Status: Appears within functional limits for tasks assessed/performed Arousal/Alertness: Awake/alert Orientation Level: Appears intact for tasks assessed Behavior During Session: [facility] for tasks performed    Balance     End of Session PT - End of Session Equipment Utilized During Treatment: Gait belt Activity Tolerance: Patient tolerated treatment well Patient left: in chair;with call bell/phone within reach Nurse Communication: Mobility status   GP     Fredrich Birks 09/24/2012, 10:43 AM 09/24/2012 Fredrich Birks PTA [phone] pager [phone] office

## 2012-09-24 NOTE — Discharge Summary (Signed)
PATIENT IDBritt Velez        MRN:  [id number]          DOB/AGE: 1951-04-02 / 62 y.o.    DISCHARGE SUMMARY  ADMISSION DATE:    09/21/2012 DISCHARGE DATE:   09/24/2012   ADMISSION DIAGNOSIS: OA right HIP    DISCHARGE DIAGNOSIS:  OA right HIP    ADDITIONAL DIAGNOSIS: Principal Problem:   Osteoarthritis of left hip  Past Medical History  Diagnosis Date  . Arthritis     PROCEDURE: Procedure(s): TOTAL HIP ARTHROPLASTY Lefton 09/21/2012  CONSULTS: PT/OT      HISTORY:  See H&P in chart  HOSPITAL COURSE:  Kenneth Velez is a 62 y.o. admitted on 09/21/2012 and found to have a diagnosis of OA right HIP.  After appropriate laboratory studies were obtained  they were taken to the operating room on 09/21/2012 and underwent  Procedure(s): TOTAL HIP ARTHROPLASTY Left.   They were given perioperative antibiotics:  Anti-infectives   Start     Dose/Rate Route Frequency Ordered Stop   09/21/12 1400  ceFAZolin (ANCEF) IVPB 1 g/50 mL premix     1 g 100 mL/hr over 30 Minutes Intravenous Every 6 hours 09/21/12 1156 09/21/12 2013   09/21/12 0600  ceFAZolin (ANCEF) IVPB 2 g/50 mL premix     2 g 100 mL/hr over 30 Minutes Intravenous On call to O.R. 09/20/12 1356 09/21/12 0745    .  Tolerated the procedure well.  Placed with a foley intraoperatively.  Given Ofirmev at induction and for 24 hours.    POD #1, allowed out of bed to a chair.  PT for ambulation and exercise program.  Foley D/C'd in morning.  IV saline locked.  O2 discontionued.  POD #2, continued PT and ambulation.  . Patient with continued complaints of pain and swelling left thigh and hip, xrays repeated no acute fracture.    The remainder of the hospital course was dedicated to ambulation and strengthening.   The patient was discharged on 3 Days Post-Op in  Stable condition.  Blood products given:none  DIAGNOSTIC STUDIES: Recent vital signs: Patient Vitals for the past 24 hrs:  BP Temp Temp src Pulse Resp SpO2  09/24/12  0710 149/67 mmHg 99.1 F (37.3 C) Oral 89 16 95 %  09/23/12 2248 154/68 mmHg 99.6 F (37.6 C) Oral 99 16 97 %  09/23/12 1818 158/72 mmHg 100.2 F (37.9 C) Oral 103 18 99 %       Recent laboratory studies:  Recent Labs  09/21/12 1200 09/22/12 0603 09/23/12 0619 09/24/12 0700  WBC 22.5* 7.7 11.8* 10.3  HGB 12.7* 10.9* 10.7* 10.3*  HCT 37.2* 32.2* 30.8* 30.1*  PLT 285 234 232 242    Recent Labs  09/21/12 1200 09/22/12 0603  NA  --  137  K  --  4.0  CL  --  102  CO2  --  30  BUN  --  7  CREATININE 0.81 0.80  GLUCOSE  --  135*  CALCIUM  --  8.8   Lab Results  Component Value Date   INR 0.99 09/14/2012     Recent Radiographic Studies :  Dg Chest 2 View  08/26/2012  *RADIOLOGY REPORT*  Clinical Data: 62 year old male - preoperative respiratory examination for hip surgery.  CHEST - 2 VIEW  Comparison: None  Findings: The cardiomediastinal silhouette is unremarkable. The lungs are clear. There is no evidence of focal airspace disease, pulmonary edema, suspicious pulmonary nodule/mass, pleural effusion, or pneumothorax.  No acute bony abnormalities are identified.  IMPRESSION: No evidence of active cardiopulmonary disease.   Original Report Authenticated By: Harmon Pier, M.D.    Dg Pelvis 1-2 Views  09/23/2012  *RADIOLOGY REPORT*  Clinical Data: Post left hip replacement  PELVIS - 1-2 VIEW  Comparison: 09/21/2012  Findings: Single frontal view of the pelvis submitted.  Again noted left hip prosthesis in anatomic alignment.  Stable degenerative changes right hip joint.  IMPRESSION: Left hip prosthesis  in anatomic alignment.   Original Report Authenticated By: Natasha Mead, M.D.    Dg Femur Left  09/23/2012  *RADIOLOGY REPORT*  Clinical Data: Status post left hip arthroplasty.  LEFT FEMUR - 2 VIEW  Comparison: 09/21/2012  Findings: A left hip arthroplasty shows normal alignment.  The femoral stem is well positioned and there is no evidence of fracture or abnormal lucency surrounding the  prosthesis.  No soft tissue abnormalities are identified.  IMPRESSION: Normal radiographic appearance and alignment status post left hip arthroplasty.   Original Report Authenticated By: Irish Lack, M.D.    Dg Pelvis Portable  09/21/2012  *RADIOLOGY REPORT*  Clinical Data: Postop left hip replacement.  PORTABLE PELVIS  Comparison: None.  Findings: Changes of left hip replacement.  No hardware or bony complicating features. Normal AP alignment.  IMPRESSION: Left hip replacement.  No complicating feature.   Original Report Authenticated By: Charlett Nose, M.D.    Dg Hip Portable 1 View Left  09/21/2012  *RADIOLOGY REPORT*  Clinical Data: Post hip replacement.  PORTABLE LEFT HIP - 1 VIEW  Comparison: None.  Findings: Left total hip arthroplasty is in place. The device is located.  No fracture is identified.  IMPRESSION: Left total hip replacement evidence of complication.   Original Report Authenticated By: Holley Dexter, M.D.     DISCHARGE INSTRUCTIONS: Discharge Orders   Future Orders Complete By Expires     Diet - low sodium heart healthy  As directed     Increase activity slowly  As directed        DISCHARGE MEDICATIONS:     Medication List    STOP taking these medications       ibuprofen 800 MG tablet  Commonly known as:  ADVIL,MOTRIN      TAKE these medications       DSS 100 MG Caps  1 tab 2 times a day while on narcotics.  STOOL SOFTENER     enoxaparin 30 MG/0.3ML injection  Commonly known as:  LOVENOX  Inject 0.3 mLs (30 mg total) into the skin every 12 (twelve) hours.     magnesium hydroxide 400 MG/5ML suspension  Commonly known as:  MILK OF MAGNESIA  Take 30 cc of milk of magnesia daily until BM regular     methocarbamol 500 MG tablet  Commonly known as:  ROBAXIN  Take 1 tablet (500 mg total) by mouth 4 (four) times daily.     oxyCODONE-acetaminophen 5-325 MG per tablet  Commonly known as:  ROXICET  1-2 tabs po q4-6hrs prn pain        FOLLOW UP VISIT:        Follow-up Information   Schedule an appointment as soon as possible for a visit with CAFFREY JR,W D, MD. (to be seen on 10/04/12)    Contact information:   [address] [place] [address] [phone]       DISPOSITION:   Home  CONDITION:  Stable   Margart Sickles 09/24/2012, 5:40 PM

## 2012-09-24 NOTE — Progress Notes (Signed)
Occupational Therapy Treatment Patient Details Name: Kenneth Velez MRN: [id number] DOB: 12-08-1950 Today's Date: 09/24/2012 Time: 4132-4401 OT Time Calculation (min): 38 min  OT Assessment / Plan / Recommendation Comments on Treatment Session Pt. was cooperative during tx session and was motivated to d/c home. Pt. verbalilzed under standing on how to use AE for LE dressing/bathing. Pt. wife also present for training.Pt. and wife ed. on proper sit to stand from multiple surfaces. Pt. was able to state 3/3 hip precautions and was able to follow.     Follow Up Recommendations  Home health OT    Barriers to Discharge       Equipment Recommendations  3 in 1 bedside comode    Recommendations for Other Services    Frequency     Plan Discharge plan remains appropriate    Precautions / Restrictions Precautions Precautions: Posterior Hip Precaution Comments:  (needed cues to kick leg out for sit to stand and stand to si) Restrictions Weight Bearing Restrictions: Yes LLE Weight Bearing: Weight bearing as tolerated   Pertinent Vitals/Pain 7/10 with mobility and 5/10 post therapy.    ADL  Lower Body Bathing: Simulated;Minimal assistance Where Assessed - Lower Body Bathing: Unsupported sitting Upper Body Dressing: Performed;Minimal assistance Where Assessed - Upper Body Dressing: Unsupported sitting;Supported standing Lower Body Dressing: Performed;Moderate assistance Where Assessed - Lower Body Dressing: Unsupported sitting;Supported standing Toilet Transfer: Performed Toilet Transfer Method: Stand pivot Acupuncturist: Comfort height toilet;Grab bars Toileting - Clothing Manipulation and Hygiene: Performed;Minimal assistance Where Assessed - Engineer, mining and Hygiene: Standing ADL Comments: Pt. required Mod cues to use AE correctly.    OT Diagnosis:    OT Problem List:   OT Treatment Interventions:     OT Goals ADL Goals ADL Goal: Lower Body Bathing  - Progress: Progressing toward goals ADL Goal: Lower Body Dressing - Progress: Progressing toward goals ADL Goal: Toilet Transfer - Progress: Progressing toward goals ADL Goal: Toileting - Clothing Manipulation - Progress: Progressing toward goals  Visit Information  Last OT Received On: 09/24/12 Assistance Needed: +1    Subjective Data      Prior Functioning       Cognition  Cognition Overall Cognitive Status: Appears within functional limits for tasks assessed/performed Arousal/Alertness: Awake/alert Orientation Level: Appears intact for tasks assessed Behavior During Session: [facility] for tasks performed    Mobility  Bed Mobility Supine to Sit: 5: Supervision Sitting - Scoot to Edge of Bed: 5: Supervision Sit to Supine: 4: Min assist;HOB flat Details for Bed Mobility Assistance:  (Pt. wife assisted without cues appropriately.) Transfers Sit to Stand: 4: Min guard;From chair/3-in-1 Stand to Sit: 4: Min guard;To bed;To chair/3-in-1 Details for Transfer Assistance:  (cues to kick out leg)    Exercises  Total Joint Exercises Heel Slides: AAROM;Left;10 reps Hip ABduction/ADduction: AAROM;Left;10 reps Long Arc Quad: AROM;Left;10 reps   Balance     End of Session OT - End of Session Activity Tolerance: Patient tolerated treatment well Patient left: in bed;with call bell/phone within reach;with family/visitor present  GO     [name] 09/24/2012, 10:58 AM

## 2013-07-04 IMAGING — CR DG FEMUR 2V*L*
5 series · 5 of 5 positions shown · non-contrast
Comparison: 09/21/2012

CLINICAL DATA: Status post left hip arthroplasty.

LEFT FEMUR - 2 VIEW

[t femur proximal ap left]
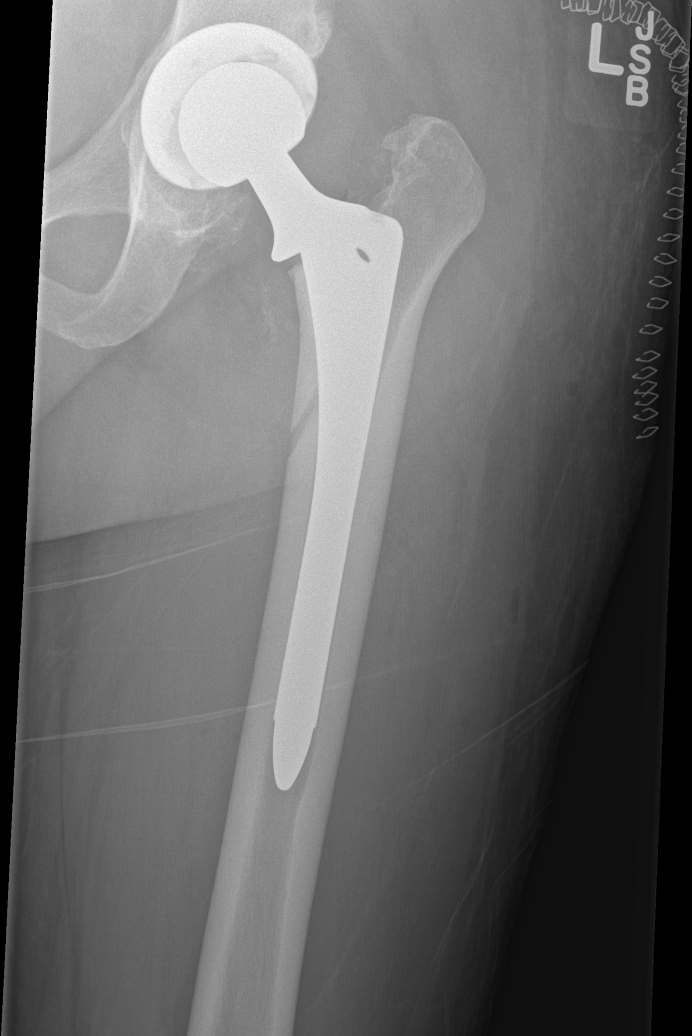

[t femur distal ap left (1 of 2)]
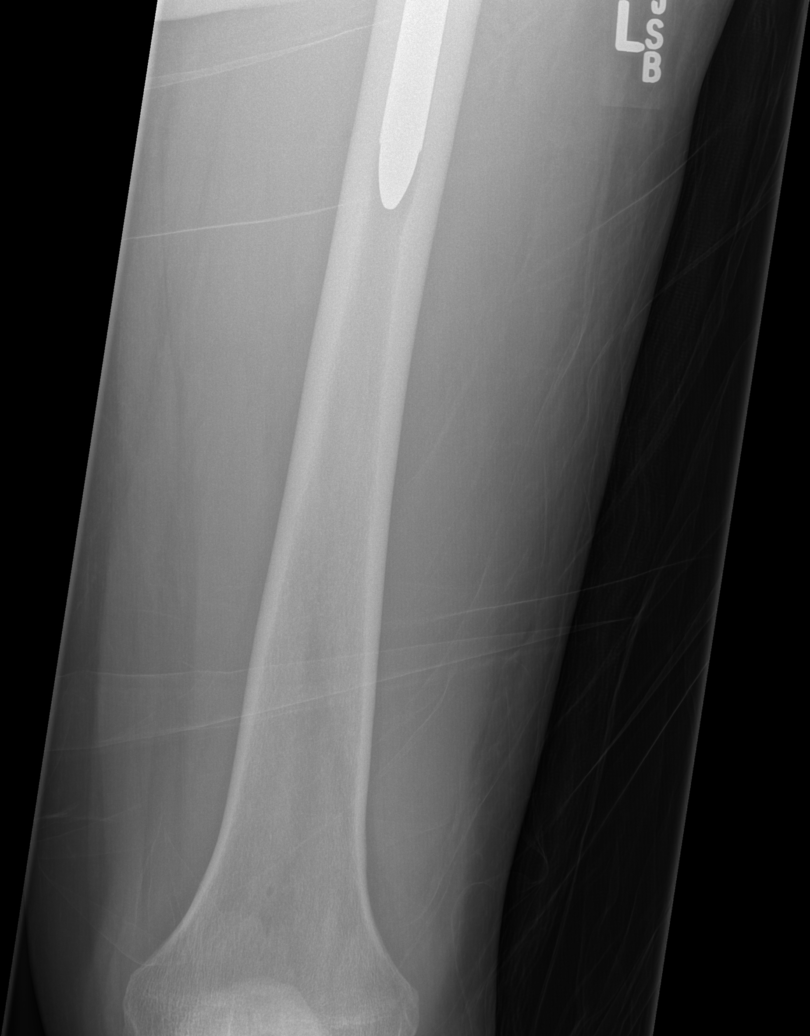

[t femur distal ap left (2 of 2)]
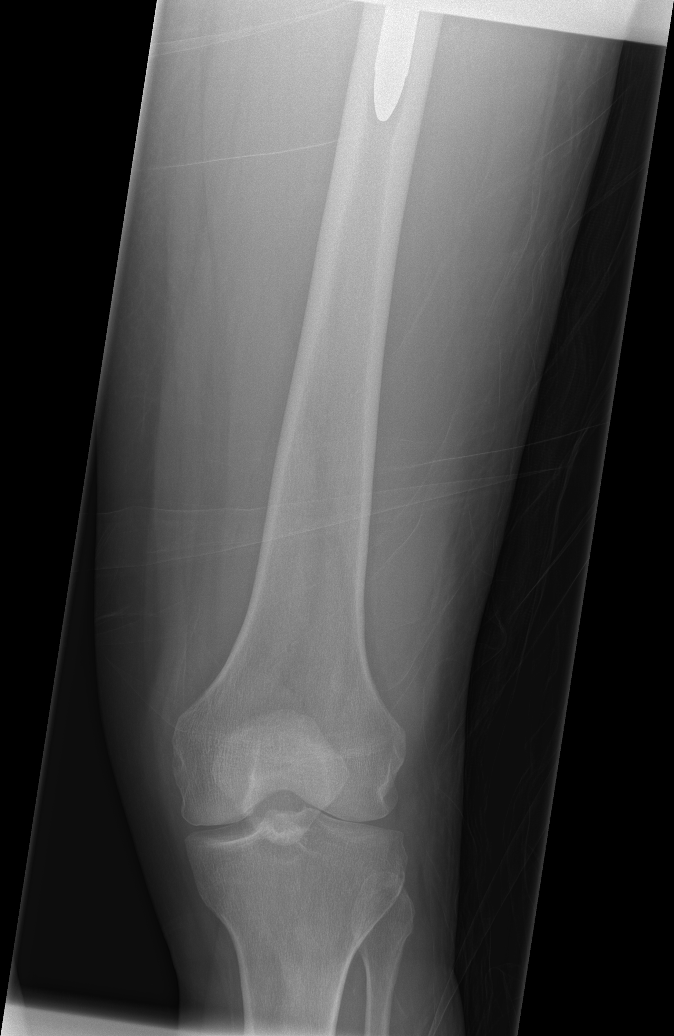

[t femur proximal lat left]
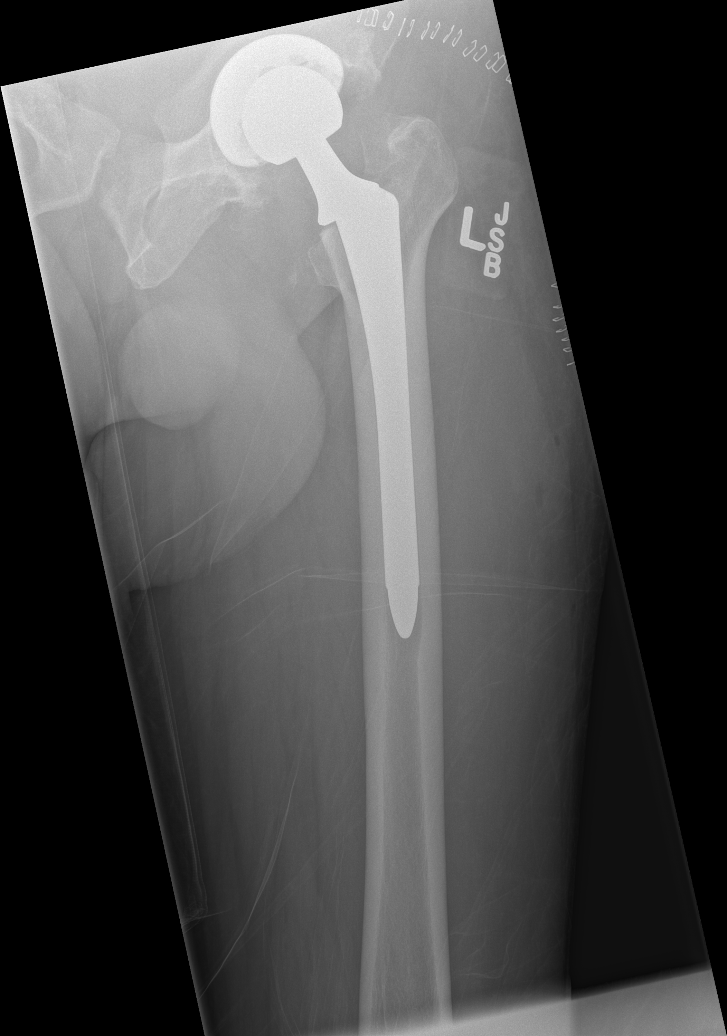

[x femur distal lat left]
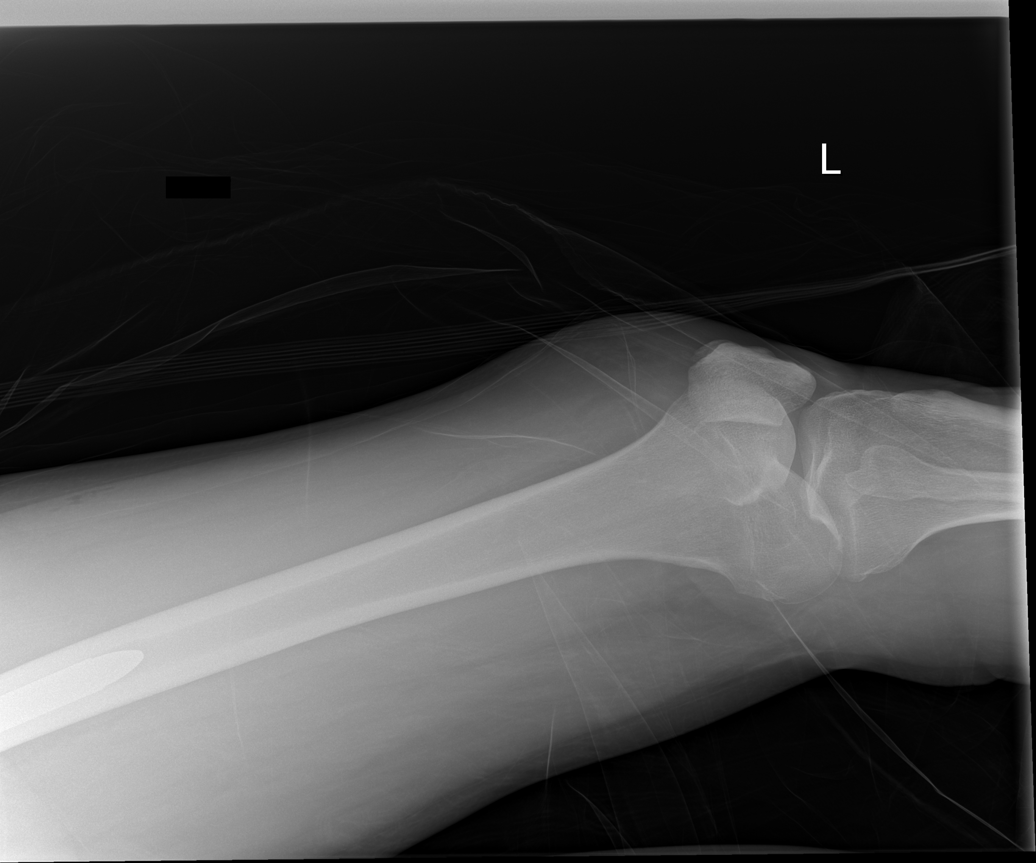

[5 of 5 positions shown; findings below may reference images not displayed]

FINDINGS: A left hip arthroplasty shows normal alignment.  The
femoral stem is well positioned and there is no evidence of
fracture or abnormal lucency surrounding the prosthesis.  No soft
tissue abnormalities are identified.
IMPRESSION: Normal radiographic appearance and alignment status post left hip
arthroplasty.

## 2013-09-29 ENCOUNTER — Ambulatory Visit (INDEPENDENT_AMBULATORY_CARE_PROVIDER_SITE_OTHER): Payer: BC Managed Care – PPO | Admitting: Internal Medicine

## 2013-09-29 VITALS — BP 139/84 | HR 69 | Temp 98.2°F | Resp 18 | Ht 63.75 in | Wt 182.0 lb

## 2013-09-29 DIAGNOSIS — H11009 Unspecified pterygium of unspecified eye: Secondary | ICD-10-CM

## 2013-09-29 DIAGNOSIS — Z96649 Presence of unspecified artificial hip joint: Secondary | ICD-10-CM

## 2013-09-29 DIAGNOSIS — Z0000 Encounter for general adult medical examination without abnormal findings: Secondary | ICD-10-CM

## 2013-09-29 DIAGNOSIS — Z96643 Presence of artificial hip joint, bilateral: Secondary | ICD-10-CM

## 2013-09-29 DIAGNOSIS — M549 Dorsalgia, unspecified: Secondary | ICD-10-CM

## 2013-09-29 DIAGNOSIS — R51 Headache: Secondary | ICD-10-CM

## 2013-09-29 DIAGNOSIS — K921 Melena: Secondary | ICD-10-CM

## 2013-09-29 LAB — POCT SEDIMENTATION RATE: POCT SED RATE: 8 mm/hr (ref 0–22)

## 2013-09-29 LAB — POCT CBC
Granulocyte percent: 63.6 %G (ref 37–80)
HEMATOCRIT: 47.3 % (ref 43.5–53.7)
Hemoglobin: 14.9 g/dL (ref 14.1–18.1)
LYMPH, POC: 1.9 (ref 0.6–3.4)
MCH, POC: 30 pg (ref 27–31.2)
MCHC: 31.5 g/dL — AB (ref 31.8–35.4)
MCV: 95.2 fL (ref 80–97)
MID (CBC): 0.3 (ref 0–0.9)
MPV: 9.8 fL (ref 0–99.8)
POC Granulocyte: 3.8 (ref 2–6.9)
POC LYMPH %: 31.8 % (ref 10–50)
POC MID %: 4.6 %M (ref 0–12)
Platelet Count, POC: 327 10*3/uL (ref 142–424)
RBC: 4.97 M/uL (ref 4.69–6.13)
RDW, POC: 13 %
WBC: 6 10*3/uL (ref 4.6–10.2)

## 2013-09-29 LAB — POCT URINALYSIS DIPSTICK
BILIRUBIN UA: NEGATIVE
GLUCOSE UA: NEGATIVE
KETONES UA: NEGATIVE
LEUKOCYTES UA: NEGATIVE
NITRITE UA: NEGATIVE
Protein, UA: NEGATIVE
RBC UA: NEGATIVE
Spec Grav, UA: 1.02
Urobilinogen, UA: 0.2
pH, UA: 7.5

## 2013-09-29 LAB — POCT UA - MICROSCOPIC ONLY
Casts, Ur, LPF, POC: NEGATIVE
Crystals, Ur, HPF, POC: NEGATIVE
Epithelial cells, urine per micros: NEGATIVE
RBC, URINE, MICROSCOPIC: NEGATIVE
YEAST UA: NEGATIVE

## 2013-09-29 LAB — COMPREHENSIVE METABOLIC PANEL
ALBUMIN: 4.7 g/dL (ref 3.5–5.2)
ALT: 27 U/L (ref 0–53)
AST: 22 U/L (ref 0–37)
Alkaline Phosphatase: 56 U/L (ref 39–117)
BUN: 15 mg/dL (ref 6–23)
CALCIUM: 9.5 mg/dL (ref 8.4–10.5)
CHLORIDE: 104 meq/L (ref 96–112)
CO2: 29 meq/L (ref 19–32)
Creat: 0.76 mg/dL (ref 0.50–1.35)
GLUCOSE: 140 mg/dL — AB (ref 70–99)
POTASSIUM: 4.5 meq/L (ref 3.5–5.3)
SODIUM: 140 meq/L (ref 135–145)
TOTAL PROTEIN: 7.6 g/dL (ref 6.0–8.3)
Total Bilirubin: 0.6 mg/dL (ref 0.2–1.2)

## 2013-09-29 LAB — IFOBT (OCCULT BLOOD): IFOBT: POSITIVE

## 2013-09-29 LAB — TSH: TSH: 1.295 u[IU]/mL (ref 0.350–4.500)

## 2013-09-29 LAB — LIPID PANEL
CHOL/HDL RATIO: 4.6 ratio
Cholesterol: 205 mg/dL — ABNORMAL HIGH (ref 0–200)
HDL: 45 mg/dL (ref >39)
LDL Cholesterol: 131 mg/dL — ABNORMAL HIGH (ref 0–99)
Triglycerides: 144 mg/dL (ref <150)
VLDL: 29 mg/dL (ref 0–40)

## 2013-09-29 LAB — POCT GLYCOSYLATED HEMOGLOBIN (HGB A1C): Hemoglobin A1C: 6.4

## 2013-09-29 LAB — GLUCOSE, POCT (MANUAL RESULT ENTRY): POC Glucose: 135 mg/dl — AB (ref 70–99)

## 2013-09-29 MED ORDER — IBUPROFEN 600 MG PO TABS: 600.0000 mg | ORAL_TABLET | Freq: Three times a day (TID) | ORAL | Status: DC | PRN

## 2013-09-29 NOTE — Progress Notes (Signed)
   Subjective:    [name], male    DOB: [birth year], 63 y.o.   MRN: [id number]  HPI    Review of Systems     Objective:   Physical Exam        Assessment & Plan:

## 2013-09-29 NOTE — Patient Instructions (Addendum)
Immunization Schedule, Adult  Influenza vaccine.  All adults should be immunized every year.  All adults, including pregnant women and people with hives-only allergy to eggs can receive the inactivated influenza (IIV) vaccine.  Adults aged 62 49 years can receive the recombinant influenza (RIV) vaccine. The RIV vaccine does not contain any egg protein.  Adults aged 65 years or older can receive the standard-dose IIV or the high-dose IIV.  Tetanus, diphtheria, and acellular pertussis (Td, Tdap) vaccine.  Pregnant women should receive 1 dose of Tdap vaccine during each pregnancy. The dose should be obtained regardless of the length of time since the last dose. Immunization is preferred during the 27th to 36th week of gestation.  An adult who has not previously received Tdap or who does not know his or her vaccine status should receive 1 dose of Tdap. This initial dose should be followed by tetanus and diphtheria toxoids (Td) booster doses every 10 years.  Adults with an unknown or incomplete history of completing a 3-dose immunization series with Td-containing vaccines should begin or complete a primary immunization series including a Tdap dose.  Adults should receive a Td booster every 10 years.  Varicella vaccine.  An adult without evidence of immunity to varicella should receive 2 doses or a second dose if he or she has previously received 1 dose.  Pregnant females who do not have evidence of immunity should receive the first dose after pregnancy. This first dose should be obtained before leaving the health care facility. The second dose should be obtained 4 8 weeks after the first dose.  Human papillomavirus (HPV) vaccine.  Females aged 13 26 years who have not received the vaccine previously should obtain the 3-dose series.  The vaccine is not recommended for use in pregnant females. However, pregnancy testing is not needed before receiving a dose. If a male is found to be  pregnant after receiving a dose, no treatment is needed. In that case, the remaining doses should be delayed until after the pregnancy.  Males aged 13 21 years who have not received the vaccine previously should receive the 3-dose series. Males aged 22 26 years may be immunized.  Immunization is recommended through the age of 26 years for any male who has sex with males and did not get any or all doses earlier.  Immunization is recommended for any person with an immunocompromised condition through the age of 26 years if he or she did not get any or all doses earlier.  During the 3-dose series, the second dose should be obtained 4 8 weeks after the first dose. The third dose should be obtained 24 weeks after the first dose and 16 weeks after the second dose.  Zoster vaccine.  One dose is recommended for adults aged 60 years or older unless certain conditions are present.  Measles, mumps, and rubella (MMR) vaccine.  Adults born before 1957 generally are considered immune to measles and mumps.  Adults born in 1957 or later should have 1 or more doses of MMR vaccine unless there is a contraindication to the vaccine or there is laboratory evidence of immunity to each of the three diseases.  A routine second dose of MMR vaccine should be obtained at least 28 days after the first dose for students attending postsecondary schools, health care workers, or international travelers.  People who received inactivated measles vaccine or an unknown type of measles vaccine during 1963 1967 should receive 2 doses of MMR vaccine.  People who received   inactivated mumps vaccine or an unknown type of mumps vaccine before 1979 and are at high risk for mumps infection should consider immunization with 2 doses of MMR vaccine.  For females of childbearing age, rubella immunity should be determined. If there is no evidence of immunity, females who are not pregnant should be vaccinated. If there is no evidence of  immunity, females who are pregnant should delay immunization until after pregnancy.  Unvaccinated health care workers born before 45 who lack laboratory evidence of measles, mumps, or rubella immunity or laboratory confirmation of disease should consider measles and mumps immunization with 2 doses of MMR vaccine or rubella immunization with 1 dose of MMR vaccine.  Pneumococcal 13-valent conjugate (PCV13) vaccine.  When indicated, a person who is uncertain of his or her immunization history and has no record of immunization should receive the PCV13 vaccine.  An adult aged 38 years or older who has certain medical conditions and has not been previously immunized should receive 1 dose of PCV13 vaccine. This PCV13 should be followed with a dose of pneumococcal polysaccharide (PPSV23) vaccine. The PPSV23 vaccine dose should be obtained at least 8 weeks after the dose of PCV13 vaccine.  An adult aged 33 years or older who has certain medical conditions and previously received 1 or more doses of PPSV23 vaccine should receive 1 dose of PCV13. The PCV13 vaccine dose should be obtained 1 or more years after the last PPSV23 vaccine dose.  Pneumococcal polysaccharide (PPSV23) vaccine.  When PCV13 is also indicated, PCV13 should be obtained first.  All adults aged 71 years and older should be immunized.  An adult younger than age 74 years who has certain medical conditions should be immunized.  Any person who resides in a nursing home or long-term care facility should be immunized.  An adult smoker should be immunized.  People with an immunocompromised condition and certain other conditions should receive both PCV13 and PPSV23 vaccines.  People with human immunodeficiency virus (HIV) infection should be immunized as soon as possible after diagnosis.  Immunization during chemotherapy or radiation therapy should be avoided.  Routine use of PPSV23 vaccine is not recommended for American Indians,  [place] Natives, or people younger than 65 years unless there are medical conditions that require PPSV23 vaccine.  When indicated, people who have unknown immunization and have no record of immunization should receive PPSV23 vaccine.  One-time revaccination 5 years after the first dose of PPSV23 is recommended for people aged 22 64 years who have chronic kidney failure, nephrotic syndrome, asplenia, or immunocompromised conditions.  People who received 1 2 doses of PPSV23 before age 69 years should receive another dose of PPSV23 vaccine at age 70 years or later if at least 5 years have passed since the previous dose.  Doses of PPSV23 are not needed for people immunized with PPSV23 at or after age 66 years.  Meningococcal vaccine.  Adults with asplenia or persistent complement component deficiencies should receive 2 doses of quadrivalent meningococcal conjugate (MenACWY-D) vaccine. The doses should be obtained at least 2 months apart.  Microbiologists working with certain meningococcal bacteria, [place] recruits, people at risk during an outbreak, and people who travel to or live in countries with a high rate of meningitis should be immunized.  A first-year college student up through age 74 years who is living in a residence hall should receive a dose if he or she did not receive a dose on or after his or her 16th birthday.  Adults who have  certain high-risk conditions should receive one or more doses of vaccine.  Hepatitis A vaccine.  Adults who wish to be protected from this disease, have certain high-risk conditions, work with hepatitis A-infected animals, work in hepatitis A research labs, or travel to or work in countries with a high rate of hepatitis A should be immunized.  Adults who were previously unvaccinated and who anticipate close contact with an international adoptee during the first 60 days after arrival in the [place] from a country with a high rate of hepatitis A should  be immunized.  Hepatitis B vaccine.  Adults who wish to be protected from this disease, have certain high-risk conditions, may be exposed to blood or other infectious body fluids, are household contacts or sex partners of hepatitis B positive people, are clients or workers in certain care facilities, or travel to or work in countries with a high rate of hepatitis B should be immunized.  Haemophilus influenzae type b (Hib) vaccine.  A previously unvaccinated person with asplenia or sickle cell disease or having a scheduled splenectomy should receive 1 dose of Hib vaccine.  Regardless of previous immunization, a recipient of a hematopoietic stem cell transplant should receive a 3-dose series 6 12 months after his or her successful transplant.  Hib vaccine is not recommended for adults with HIV infection. Document Released: 10/08/2003 Document Revised: 11/12/2012 Document Reviewed: 09/04/2012 [facility] Patient Information 2014 [place], [place]. Arthralgia Your caregiver has diagnosed you as suffering from an arthralgia. Arthralgia means there is pain in a joint. This can come from many reasons including:  Bruising the joint which causes soreness (inflammation) in the joint.  Wear and tear on the joints which occur as we grow older (osteoarthritis).  Overusing the joint.  Various forms of arthritis.  Infections of the joint. Regardless of the cause of pain in your joint, most of these different pains respond to anti-inflammatory drugs and rest. The exception to this is when a joint is infected, and these cases are treated with antibiotics, if it is a bacterial infection. HOME CARE INSTRUCTIONS   Rest the injured area for as long as directed by your caregiver. Then slowly start using the joint as directed by your caregiver and as the pain allows. Crutches as directed may be useful if the ankles, knees or hips are involved. If the knee was splinted or casted, continue use and care as directed.  If an stretchy or elastic wrapping bandage has been applied today, it should be removed and re-applied every 3 to 4 hours. It should not be applied tightly, but firmly enough to keep swelling down. Watch toes and feet for swelling, bluish discoloration, coldness, numbness or excessive pain. If any of these problems (symptoms) occur, remove the ace bandage and re-apply more loosely. If these symptoms persist, contact your caregiver or return to this location.  For the first 24 hours, keep the injured extremity elevated on pillows while lying down.  Apply ice for 15-20 minutes to the sore joint every couple hours while awake for the first half day. Then 03-04 times per day for the first 48 hours. Put the ice in a plastic bag and place a towel between the bag of ice and your skin.  Wear any splinting, casting, elastic bandage applications, or slings as instructed.  Only take over-the-counter or prescription medicines for pain, discomfort, or fever as directed by your caregiver. Do not use aspirin immediately after the injury unless instructed by your physician. Aspirin can cause increased bleeding  and bruising of the tissues.  If you were given crutches, continue to use them as instructed and do not resume weight bearing on the sore joint until instructed. Persistent pain and inability to use the sore joint as directed for more than 2 to 3 days are warning signs indicating that you should see a caregiver for a follow-up visit as soon as possible. Initially, a hairline fracture (break in bone) may not be evident on X-rays. Persistent pain and swelling indicate that further evaluation, non-weight bearing or use of the joint (use of crutches or slings as instructed), or further X-rays are indicated. X-rays may sometimes not show a small fracture until a week or 10 days later. Make a follow-up appointment with your own caregiver or one to whom we have referred you. A radiologist (specialist in reading X-rays)  may read your X-rays. Make sure you know how you are to obtain your X-ray results. Do not assume everything is normal if you do not hear from [place]. SEEK MEDICAL CARE IF: Bruising, swelling, or pain increases. SEEK IMMEDIATE MEDICAL CARE IF:   Your fingers or toes are numb or blue.  The pain is not responding to medications and continues to stay the same or get worse.  The pain in your joint becomes severe.  You develop a fever over 102 F (38.9 C).  It becomes impossible to move or use the joint. MAKE SURE YOU:   Understand these instructions.  Will watch your condition.  Will get help right away if you are not doing well or get worse. Document Released: 07/18/2005 Document Revised: 10/10/2011 Document Reviewed: 03/05/2008 [facility] Patient Information 2014 [place].

## 2013-09-29 NOTE — Progress Notes (Signed)
Subjective:    [name], male    DOB: 03/01/1951, 63 y.o.   MRN: [id number]  HPI 63 y.o. Male presents to clinic for annual physical.   Reports that he has been having trouble with sweating and feeling fatigued more often then usual. Started to notice this over the past 6 months to a year. States that he notices this when he starts working.  Also reports of lower back pain, notices discomfort when working and with movement. Denies any radiation down legs. Denies any numbness, tingling in legs but state that left wrist becomes numb. 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 Denies any trouble with bowels of bladder due to back pain.  Patient states that he has been having headaches in the morning upon waking up. States that headache gets better through the day. Patient states this problem has been going on for the past 3 years.Denies any trouble with vision or dizziness when he has headaches. Denies any intolerance to light or sound with headaches.Has no focal NMS loss by hx.  Never had colonoscopy  Last PSA 4.41   Review of Systems  Constitutional: Positive for fatigue.  Eyes: Positive for photophobia, redness and visual disturbance.  Genitourinary: Positive for frequency.  Neurological: Positive for headaches.       Objective:   Physical Exam  Vitals reviewed. Constitutional: He is oriented to person, place, and time. He appears well-developed and well-nourished. No distress.  HENT:  Head: Normocephalic.  Right Ear: External ear normal.  Left Ear: External ear normal.  Nose: Nose normal.  Mouth/Throat: Oropharynx is clear and moist.  Eyes: EOM are normal. Pupils are equal, round, and reactive to light. Right conjunctiva is injected. Left conjunctiva is injected.    ptyregium  Scheduled to see opthalmologist next week  Cardiovascular: Normal rate, regular rhythm, normal heart sounds and intact distal pulses.   Pulmonary/Chest: Breath sounds normal.    Abdominal: Soft. Bowel sounds are normal. There is no tenderness.  Genitourinary: Prostate normal and penis normal. Guaiac positive stool.  Musculoskeletal: He exhibits tenderness.  Neurological: He is alert and oriented to person, place, and time. He has normal reflexes. No cranial nerve deficit. He exhibits normal muscle tone. Coordination normal.  Skin: No rash noted.  Psychiatric: He has a normal mood and affect. His behavior is normal. Judgment and thought content normal.   Results for orders placed in visit on 09/29/13  POCT UA - MICROSCOPIC ONLY      Result Value Ref Range   WBC, Ur, HPF, POC 0-1     RBC, urine, microscopic neg     Bacteria, U Microscopic 1+     Mucus, UA large     Epithelial cells, urine per micros neg     Crystals, Ur, HPF, POC neg     Casts, Ur, LPF, POC neg     Yeast, UA neg    POCT URINALYSIS DIPSTICK      Result Value Ref Range   Color, UA dk yellow     Clarity, UA clear     Glucose, UA neg     Bilirubin, UA neg     Ketones, UA neg     Spec Grav, UA 1.020     Blood, UA neg     pH, UA 7.5     Protein, UA neg     Urobilinogen, UA 0.2     Nitrite, UA neg     Leukocytes, UA Negative    POCT CBC  Result Value Ref Range   WBC 6.0  4.6 - 10.2 K/uL   Lymph, poc 1.9  0.6 - 3.4   POC LYMPH PERCENT 31.8  10 - 50 %L   MID (cbc) 0.3  0 - 0.9   POC MID % 4.6  0 - 12 %M   POC Granulocyte 3.8  2 - 6.9   Granulocyte percent 63.6  37 - 80 %G   RBC 4.97  4.69 - 6.13 M/uL   Hemoglobin 14.9  14.1 - 18.1 g/dL   HCT, POC 47.3  43.5 - 53.7 %   MCV 95.2  80 - 97 fL   MCH, POC 30.0  27 - 31.2 pg   MCHC 31.5 (*) 31.8 - 35.4 g/dL   RDW, POC 13.0     Platelet Count, POC 327  142 - 424 K/uL   MPV 9.8  0 - 99.8 fL  GLUCOSE, POCT (MANUAL RESULT ENTRY)      Result Value Ref Range   POC Glucose 135 (*) 70 - 99 mg/dl  IFOBT (OCCULT BLOOD)      Result Value Ref Range   IFOBT Positive    POCT GLYCOSYLATED HEMOGLOBIN (HGB A1C)      Result Value Ref Range    Hemoglobin A1C 6.4            Assessment & Plan:  CPE LBP/Recurrent HA/LBP Blood in stool/Refer for colonoscopy this month

## 2013-09-30 LAB — PSA: PSA: 4.31 ng/mL — AB (ref <=4.00)

## 2013-10-01 ENCOUNTER — Encounter: Payer: Self-pay | Admitting: *Deleted

## 2013-10-10 ENCOUNTER — Encounter: Payer: Self-pay | Admitting: Gastroenterology

## 2013-11-18 ENCOUNTER — Ambulatory Visit (AMBULATORY_SURGERY_CENTER): Payer: Self-pay

## 2013-11-18 VITALS — Ht 65.0 in | Wt 179.4 lb

## 2013-11-18 DIAGNOSIS — K921 Melena: Secondary | ICD-10-CM

## 2013-11-18 MED ORDER — MOVIPREP 100 G PO SOLR: 1.0000 | Freq: Once | ORAL | Status: DC

## 2013-11-18 NOTE — Progress Notes (Signed)
No allergies to eggs or soy. No problems with anesthesia in the past. No weight loss/diet drugs. No oxygen on home.

## 2013-11-20 ENCOUNTER — Encounter: Payer: Self-pay | Admitting: Gastroenterology

## 2013-12-02 ENCOUNTER — Ambulatory Visit (AMBULATORY_SURGERY_CENTER): Payer: BC Managed Care – PPO | Admitting: Gastroenterology

## 2013-12-02 ENCOUNTER — Encounter: Payer: Self-pay | Admitting: Gastroenterology

## 2013-12-02 VITALS — BP 105/72 | HR 64 | Temp 97.2°F | Resp 15 | Ht 65.0 in | Wt 179.0 lb

## 2013-12-02 DIAGNOSIS — Z1211 Encounter for screening for malignant neoplasm of colon: Secondary | ICD-10-CM

## 2013-12-02 DIAGNOSIS — K921 Melena: Secondary | ICD-10-CM

## 2013-12-02 DIAGNOSIS — D126 Benign neoplasm of colon, unspecified: Secondary | ICD-10-CM

## 2013-12-02 MED ORDER — SODIUM CHLORIDE 0.9 % IV SOLN: 500.0000 mL | INTRAVENOUS | Status: DC

## 2013-12-02 NOTE — Op Note (Signed)
[place]  [address] [place], [id number]   COLONOSCOPY PROCEDURE REPORT  PATIENT: Kenneth Velez, Kenneth Velez  MR#: [id number] BIRTHDATE: 1951/01/21 , 12  yrs. old GENDER: Male ENDOSCOPIST: Milus Banister, MD REFERRED KG:YJEHU Guest, M.D. PROCEDURE DATE:  12/02/2013 PROCEDURE:   Colonoscopy with snare polypectomy First Screening Colonoscopy - Avg.  risk and is 50 yrs.  old or older Yes.  Prior Negative Screening - Now for repeat screening. N/A  History of Adenoma - Now for follow-up colonoscopy & has been > or = to 3 yrs.  N/A  Polyps Removed Today? Yes. ASA CLASS:   Class II INDICATIONS:average risk screening. MEDICATIONS: MAC sedation, administered by CRNA and propofol (Diprivan) 200mg  IV  DESCRIPTION OF PROCEDURE:   After the risks benefits and alternatives of the procedure were thoroughly explained, informed consent was obtained.  A digital rectal exam revealed no abnormalities of the rectum.   The [id number]  endoscope was introduced through the anus and advanced to the cecum, which was identified by both the appendix and ileocecal valve. No adverse events experienced.   The quality of the prep was excellent.  The instrument was then slowly withdrawn as the colon was fully examined.  COLON FINDINGS: Two polyps were found, removed and sent to pathology.  These were both sessile, located in ascending and descending segment, 3-1mm across, removed with cold snare (jar 1). The examination was otherwise normal.  Retroflexed views revealed no abnormalities. The time to cecum=2 minutes 22 seconds. Withdrawal time=9 minutes 54 seconds.  The scope was withdrawn and the procedure completed. COMPLICATIONS: There were no complications.  ENDOSCOPIC IMPRESSION: Two polyps were found, removed and sent to pathology. The examination was otherwise normal.  RECOMMENDATIONS: If the polyp(s) removed today are proven to be adenomatous (pre-cancerous) polyps, you will  need a repeat colonoscopy in 5 years.  Otherwise you should continue to follow colorectal cancer screening guidelines for "routine risk" patients with colonoscopy in 10 years.  You will receive a letter within 1-2 weeks with the results of your biopsy as well as final recommendations.  Please call my office if you have not received a letter after 3 weeks.   eSigned:  Milus Banister, MD 12/02/2013 9:51 AM

## 2013-12-02 NOTE — Progress Notes (Signed)
Called to room to assist during endoscopic procedure.  Patient ID and intended procedure confirmed with present staff. Received instructions for my participation in the procedure from the performing physician.  

## 2013-12-02 NOTE — Patient Instructions (Signed)
YOU HAD AN ENDOSCOPIC PROCEDURE TODAY AT THE [facility]: Refer to the procedure report that was given to you for any specific questions about what was found during the examination.  If the procedure report does not answer your questions, please call your gastroenterologist to clarify.  If you requested that your care partner not be given the details of your procedure findings, then the procedure report has been included in a sealed envelope for you to review at your convenience later.  YOU SHOULD EXPECT: Some feelings of bloating in the abdomen. Passage of more gas than usual.  Walking can help get rid of the air that was put into your GI tract during the procedure and reduce the bloating. If you had a lower endoscopy (such as a colonoscopy or flexible sigmoidoscopy) you may notice spotting of blood in your stool or on the toilet paper. If you underwent a bowel prep for your procedure, then you may not have a normal bowel movement for a few days.  DIET: Your first meal following the procedure should be a light meal and then it is ok to progress to your normal diet.  A half-sandwich or bowl of soup is an example of a good first meal.  Heavy or fried foods are harder to digest and may make you feel nauseous or bloated.  Likewise meals heavy in dairy and vegetables can cause extra gas to form and this can also increase the bloating.  Drink plenty of fluids but you should avoid alcoholic beverages for 24 hours.  ACTIVITY: Your care partner should take you home directly after the procedure.  You should plan to take it easy, moving slowly for the rest of the day.  You can resume normal activity the day after the procedure however you should NOT DRIVE or use heavy machinery for 24 hours (because of the sedation medicines used during the test).    SYMPTOMS TO REPORT IMMEDIATELY: A gastroenterologist can be reached at any hour.  During normal business hours, 8:30 AM to 5:00 PM Monday through Friday,  call [phone].  After hours and on weekends, please call the GI answering service at [phone] who will take a message and have the physician on call contact you.   Following lower endoscopy (colonoscopy or flexible sigmoidoscopy):  Excessive amounts of blood in the stool  Significant tenderness or worsening of abdominal pains  Swelling of the abdomen that is new, acute  Fever of 100F or higher  FOLLOW UP: If any biopsies were taken you will be contacted by phone or by letter within the next 1-3 weeks.  Call your gastroenterologist if you have not heard about the biopsies in 3 weeks.  Our staff will call the home number listed on your records the next business day following your procedure to check on you and address any questions or concerns that you may have at that time regarding the information given to you following your procedure. This is a courtesy call and so if there is no answer at the home number and we have not heard from you through the emergency physician on call, we will assume that you have returned to your regular daily activities without incident.  SIGNATURES/CONFIDENTIALITY: You and/or your care partner have signed paperwork which will be entered into your electronic medical record.  These signatures attest to the fact that that the information above on your After Visit Summary has been reviewed and is understood.  Full responsibility of the confidentiality of this   discharge information lies with you and/or your care-partner.  Handout on polyps given.

## 2013-12-02 NOTE — Progress Notes (Signed)
Procedure ends, to recovery, report given and VSS. 

## 2013-12-03 ENCOUNTER — Telehealth: Payer: Self-pay | Admitting: *Deleted

## 2013-12-03 NOTE — Telephone Encounter (Signed)
No answer, message left for the patient. 

## 2013-12-09 ENCOUNTER — Ambulatory Visit: Payer: BC Managed Care – PPO

## 2013-12-09 ENCOUNTER — Ambulatory Visit: Payer: BC Managed Care – PPO | Admitting: Internal Medicine

## 2013-12-09 VITALS — BP 140/80 | HR 63 | Temp 98.4°F | Resp 16 | Ht 65.5 in | Wt 178.0 lb

## 2013-12-09 DIAGNOSIS — M4306 Spondylolysis, lumbar region: Secondary | ICD-10-CM

## 2013-12-09 DIAGNOSIS — Z96649 Presence of unspecified artificial hip joint: Secondary | ICD-10-CM

## 2013-12-09 DIAGNOSIS — R972 Elevated prostate specific antigen [PSA]: Secondary | ICD-10-CM

## 2013-12-09 DIAGNOSIS — M545 Low back pain, unspecified: Secondary | ICD-10-CM

## 2013-12-09 DIAGNOSIS — H11009 Unspecified pterygium of unspecified eye: Secondary | ICD-10-CM

## 2013-12-09 DIAGNOSIS — M4310 Spondylolisthesis, site unspecified: Secondary | ICD-10-CM

## 2013-12-09 DIAGNOSIS — Z96643 Presence of artificial hip joint, bilateral: Secondary | ICD-10-CM

## 2013-12-09 DIAGNOSIS — Z0000 Encounter for general adult medical examination without abnormal findings: Secondary | ICD-10-CM

## 2013-12-09 DIAGNOSIS — R51 Headache: Secondary | ICD-10-CM

## 2013-12-09 DIAGNOSIS — E7800 Pure hypercholesterolemia, unspecified: Secondary | ICD-10-CM

## 2013-12-09 DIAGNOSIS — M549 Dorsalgia, unspecified: Secondary | ICD-10-CM

## 2013-12-09 MED ORDER — METHYLPREDNISOLONE ACETATE 80 MG/ML IJ SUSP
120.0000 mg | Freq: Once | INTRAMUSCULAR | Status: AC
  Administered 2013-12-09: 120 mg via INTRAMUSCULAR

## 2013-12-09 MED ORDER — IBUPROFEN 600 MG PO TABS: 600.0000 mg | ORAL_TABLET | Freq: Three times a day (TID) | ORAL | Status: DC | PRN

## 2013-12-09 MED ORDER — ATORVASTATIN CALCIUM 10 MG PO TABS: 10.0000 mg | ORAL_TABLET | Freq: Every day | ORAL | Status: DC

## 2013-12-09 NOTE — Progress Notes (Signed)
   Subjective:    [name], male    DOB: [birth year], 63 y.o.   MRN: [id number]  HPI Waseem presents today with lower right sided back pain. He states it has been present for the past six month, but it has been more pronounced over the past month. He also states that there was an MRI of his lower back performed about a year ago which was not done with in the Epic system. Dontarius has had a left hip replacement done in the past year as well in which he does feel his pain is related. His pain is constant. There is no past injury. His pain doesn't run down his leg. He denies any urinary or bowel incontinence.   No radiation, weakness or numbness.No fever , weight loss, anorexia, or fatigue. No urinary sxs. Recent cpe by me, had colon polyps removed, PSA high 4.31 down from 4.41, LDL up to 130s. Review of Systems     Objective:   Physical Exam  Constitutional: He is oriented to person, place, and time. He appears well-developed and well-nourished. No distress.  HENT:  Head: Normocephalic.  Right Ear: External ear normal.  Left Ear: External ear normal.  Eyes: EOM are normal.  Neck: Normal range of motion. Neck supple.  Cardiovascular: Normal rate.   Pulmonary/Chest: Effort normal.  Musculoskeletal:       Lumbar back: He exhibits decreased range of motion, tenderness, bony tenderness, pain and spasm. He exhibits no swelling, no edema, no deformity, no laceration and normal pulse.       Back:  Neurological: He is alert and oriented to person, place, and time. He has normal strength. No cranial nerve deficit or sensory deficit. He displays a negative Romberg sign. Gait normal.  Skin: No rash noted.  Psychiatric: He has a normal mood and affect. His behavior is normal. Judgment and thought content normal.   [facility] Policy for Prescribing Controlled Substances (Revised 06/2012) 1. Prescriptions for controlled substances will be filled by ONE provider at [facility] with whom you have  established and developed a plan for your care, including follow-up. 2. You are encouraged to schedule an appointment with your prescriber at our appointment center for follow-up visits whenever possible. 3. If you request a prescription for the controlled substance while at [facility] for an acute problem (with someone other than your regular prescriber), you MAY be given a ONE-TIME prescription for a 30-day supply of the controlled substance, to allow time for you to return to see your regular presber for additional prescription [facility] reading (PRIMARY) by  Dr Elder Cyphers dd and moderate spondylosis         Assessment & Plan:  Chronic low back pain/Depomedrol 120mg  im/Tylenol prn/Robaxin prn/Back manual Elevated PSA/LDL Refer to urology ro prostate neoplasm Start lipitor 10mg qhs

## 2013-12-09 NOTE — Patient Instructions (Addendum)
Back Injury Prevention Back injuries can be extremely painful and difficult to heal. After having one back injury, you are much more likely to experience another later on. It is important to learn how to avoid injuring or re-injuring your back. The following tips can help you to prevent a back injury. PHYSICAL FITNESS  Exercise regularly and try to develop good tone in your abdominal muscles. Your abdominal muscles provide a lot of the support needed by your back.  Do aerobic exercises (walking, jogging, biking, swimming) regularly.  Do exercises that increase balance and strength (tai chi, yoga) regularly. This can decrease your risk of falling and injuring your back.  Stretch before and after exercising.  Maintain a healthy weight. The more you weigh, the more stress is placed on your back. For every pound of weight, 10 times that amount of pressure is placed on the back. DIET  Talk to your caregiver about how much calcium and vitamin D you need per day. These nutrients help to prevent weakening of the bones (osteoporosis). Osteoporosis can cause broken (fractured) bones that lead to back pain.  Include good sources of calcium in your diet, such as dairy products, green, leafy vegetables, and products with calcium added (fortified).  Include good sources of vitamin D in your diet, such as milk and foods that are fortified with vitamin D.  Consider taking a nutritional supplement or a multivitamin if needed.  Stop smoking if you smoke. POSTURE  Sit and stand up straight. Avoid leaning forward when you sit or hunching over when you stand.  Choose chairs with good low back (lumbar) support.  If you work at a desk, sit close to your work so you do not need to lean over. Keep your chin tucked in. Keep your neck drawn back and elbows bent at a right angle. Your arms should look like the letter "L."  Sit high and close to the steering wheel when you drive. Add a lumbar support to your car  seat if needed.  Avoid sitting or standing in one position for too long. Take breaks to get up, stretch, and walk around at least once every hour. Take breaks if you are driving for long periods of time.  Sleep on your side with your knees slightly bent, or sleep on your back with a pillow under your knees. Do not sleep on your stomach. LIFTING, TWISTING, AND REACHING  Avoid heavy lifting, especially repetitive lifting. If you must do heavy lifting:  Stretch before lifting.  Work slowly.  Rest between lifts.  Use carts and dollies to move objects when possible.  Make several small trips instead of carrying 1 heavy load.  Ask for help when you need it.  Ask for help when moving big, awkward objects.  Follow these steps when lifting:  Stand with your feet shoulder-width apart.  Get as close to the object as you can. Do not try to pick up heavy objects that are far from your body.  Use handles or lifting straps if they are available.  Bend at your knees. Squat down, but keep your heels off the floor.  Keep your shoulders pulled back, your chin tucked in, and your back straight.  Lift the object slowly, tightening the muscles in your legs, abdomen, and buttocks. Keep the object as close to the center of your body as possible.  When you put a load down, use these same guidelines in reverse.  Do not:  Lift the object above your waist.    Twist at the waist while lifting or carrying a load. Move your feet if you need to turn, not your waist.  Bend over without bending at your knees.  Avoid reaching over your head, across a table, or for an object on a high surface. OTHER TIPS  Avoid wet floors and keep sidewalks clear of ice to prevent falls.  Do not sleep on a mattress that is too soft or too hard.  Keep items that are used frequently within easy reach.  Put heavier objects on shelves at waist level and lighter objects on lower or higher shelves.  Find ways to  decrease your stress, such as exercise, massage, or relaxation techniques. Stress can build up in your muscles. Tense muscles are more vulnerable to injury.  Seek treatment for depression or anxiety if needed. These conditions can increase your risk of developing back pain. SEEK MEDICAL CARE IF:  You injure your back.  You have questions about diet, exercise, or other ways to prevent back injuries. MAKE SURE YOU:  Understand these instructions.  Will watch your condition.  Will get help right away if you are not doing well or get worse. Document Released: 08/25/2004 Document Revised: 10/10/2011 Document Reviewed: 08/29/2011 [facility] Patient Information 2014 [place], [place]. Chronic Back Pain  When back pain lasts longer than 3 months, it is called chronic back pain.People with chronic back pain often go through certain periods that are more intense (flare-ups).  CAUSES Chronic back pain can be caused by wear and tear (degeneration) on different structures in your back. These structures include:  The bones of your spine (vertebrae) and the joints surrounding your spinal cord and nerve roots (facets).  The strong, fibrous tissues that connect your vertebrae (ligaments). Degeneration of these structures may result in pressure on your nerves. This can lead to constant pain. HOME CARE INSTRUCTIONS  Avoid bending, heavy lifting, prolonged sitting, and activities which make the problem worse.  Take brief periods of rest throughout the day to reduce your pain. Lying down or standing usually is better than sitting while you are resting.  Take over-the-counter or prescription medicines only as directed by your caregiver. SEEK IMMEDIATE MEDICAL CARE IF:   You have weakness or numbness in one of your legs or feet.  You have trouble controlling your bladder or bowels.  You have nausea, vomiting, abdominal pain, shortness of breath, or fainting. Document Released: 08/25/2004 Document  Revised: 10/10/2011 Document Reviewed: 07/02/2011 [facility] Patient Information 2014 [place], [place]. Cholesterol Cholesterol is a white, waxy, fat-like protein needed by your body in small amounts. The liver makes all the cholesterol you need. It is carried from the liver by the blood through the blood vessels. Deposits (plaque) may build up on blood vessel walls. This makes the arteries narrower and stiffer. Plaque increases the risk for heart attack and stroke. You cannot feel your cholesterol level even if it is very high. The only way to know is by a blood test to check your lipid (fats) levels. Once you know your cholesterol levels, you should keep a record of the test results. Work with your caregiver to to keep your levels in the desired range. WHAT THE RESULTS MEAN:  Total cholesterol is a rough measure of all the cholesterol in your blood.  LDL is the so-called bad cholesterol. This is the type that deposits cholesterol in the walls of the arteries. You want this level to be low.  HDL is the good cholesterol because it cleans the arteries and carries  the LDL away. You want this level to be high.  Triglycerides are fat that the body can either burn for energy or store. High levels are closely linked to heart disease. DESIRED LEVELS:  Total cholesterol below 200.  LDL below 100 for people at risk, below 70 for very high risk.  HDL above 50 is good, above 60 is best.  Triglycerides below 150. HOW TO LOWER YOUR CHOLESTEROL:  Diet.  Choose fish or white meat chicken and [place], roasted or baked. Limit fatty cuts of red meat, fried foods, and processed meats, such as sausage and lunch meat.  Eat lots of fresh fruits and vegetables. Choose whole grains, beans, pasta, potatoes and cereals.  Use only small amounts of olive, corn or canola oils. Avoid butter, mayonnaise, shortening or palm kernel oils. Avoid foods with trans-fats.  Use skim/nonfat milk and low-fat/nonfat yogurt and  cheeses. Avoid whole milk, cream, ice cream, egg yolks and cheeses. Healthy desserts include angel food cake, ginger snaps, animal crackers, hard candy, popsicles, and low-fat/nonfat frozen yogurt. Avoid pastries, cakes, pies and cookies.  Exercise.  A regular program helps decrease LDL and raises HDL.  Helps with weight control.  Do things that increase your activity level like gardening, walking, or taking the stairs.  Medication.  May be prescribed by your caregiver to help lowering cholesterol and the risk for heart disease.  You may need medicine even if your levels are normal if you have several risk factors. HOME CARE INSTRUCTIONS   Follow your diet and exercise programs as suggested by your caregiver.  Take medications as directed.  Have blood work done when your caregiver feels it is necessary. MAKE SURE YOU:   Understand these instructions.  Will watch your condition.  Will get help right away if you are not doing well or get worse. Document Released: 04/12/2001 Document Revised: 10/10/2011 Document Reviewed: 05/01/2013 [facility] Patient Information 2014 [place], [place]. Prostate-Specific Antigen The prostate-specific antigen (PSA) is a blood test. It is used to help detect early forms of prostate cancer. The test is usually used along with other tests. The test is also used to follow the course of those who already have prostate cancer or who have been treated for prostate cancer. Some factors interfere with the results of the PSA. The factors listed below will either increase or decrease the PSA levels. They are:  Prescriptions used for male baldness.  Some herbs.  Active prostate infection.  Prior instrumentation or urinary catheterization.  Ejaculation up to 2 days prior to testing.  A noncancerous enlargement of the prostate.  Inflammation of the prostate.  Active urinary tract infection. If your test results are elevated, your caregiver will discuss  the results with you. Your caregiver will also let you know if more evaluation is needed. PREPARATION FOR TEST No preparation or fasting is necessary. NORMAL FINDINGS Less than 4 ng/mL or Less than 86mg/L (SI units) Ranges for normal findings may vary among different laboratories and hospitals. You should always check with your caregiver after having lab work or other tests done to discuss the meaning of your test results and whether your values are considered within normal limits. MEANING OF TEST  A normal value means prostate cancer is less likely. The chance of having prostate cancer increases if the value is between 4 ng/mL and 10 ng/mL. However, further testing will be needed. Values above 10 ng/mL indicate that there is a much higher chance of having prostate cancer (if the above situations that raise  PSA are not present). Your caregiver will go over your test results with you and discuss the importance of this test. If this value is elevated, your caregiver may recommend further testing or evaluation. OBTAINING THE TEST RESULTS It is your responsibility to obtain your test results. Ask the lab or department performing the test when and how you will get your results. Document Released: 08/20/2004 Document Revised: 10/10/2011 Document Reviewed: 02/23/2007 [facility] Patient Information 2014 [place].

## 2013-12-10 ENCOUNTER — Encounter: Payer: Self-pay | Admitting: Gastroenterology

## 2014-03-26 ENCOUNTER — Ambulatory Visit (INDEPENDENT_AMBULATORY_CARE_PROVIDER_SITE_OTHER): Payer: BC Managed Care – PPO | Admitting: Family Medicine

## 2014-03-26 VITALS — BP 144/70 | HR 60 | Temp 98.1°F | Resp 16 | Ht 65.5 in | Wt 175.0 lb

## 2014-03-26 DIAGNOSIS — H9319 Tinnitus, unspecified ear: Secondary | ICD-10-CM

## 2014-03-26 DIAGNOSIS — R42 Dizziness and giddiness: Secondary | ICD-10-CM

## 2014-03-26 LAB — COMPREHENSIVE METABOLIC PANEL
ALT: 20 U/L (ref 0–53)
AST: 19 U/L (ref 0–37)
Albumin: 4.7 g/dL (ref 3.5–5.2)
Alkaline Phosphatase: 54 U/L (ref 39–117)
BUN: 14 mg/dL (ref 6–23)
CO2: 27 mEq/L (ref 19–32)
Calcium: 9.5 mg/dL (ref 8.4–10.5)
Chloride: 105 mEq/L (ref 96–112)
Creat: 0.82 mg/dL (ref 0.50–1.35)
Glucose, Bld: 122 mg/dL — ABNORMAL HIGH (ref 70–99)
Potassium: 4.5 mEq/L (ref 3.5–5.3)
Sodium: 137 mEq/L (ref 135–145)
Total Bilirubin: 0.5 mg/dL (ref 0.2–1.2)
Total Protein: 7.3 g/dL (ref 6.0–8.3)

## 2014-03-26 LAB — POCT CBC
Granulocyte percent: 68.8 %G (ref 37–80)
HCT, POC: 45.9 % (ref 43.5–53.7)
Hemoglobin: 14.9 g/dL (ref 14.1–18.1)
Lymph, poc: 2 (ref 0.6–3.4)
MCH, POC: 29.4 pg (ref 27–31.2)
MCHC: 32.4 g/dL (ref 31.8–35.4)
MCV: 90.5 fL (ref 80–97)
MID (cbc): 0.2 (ref 0–0.9)
MPV: 8.5 fL (ref 0–99.8)
POC Granulocyte: 4.7 (ref 2–6.9)
POC LYMPH PERCENT: 28.3 %L (ref 10–50)
POC MID %: 2.9 %M (ref 0–12)
Platelet Count, POC: 291 10*3/uL (ref 142–424)
RBC: 5.07 M/uL (ref 4.69–6.13)
RDW, POC: 13.3 %
WBC: 6.9 10*3/uL (ref 4.6–10.2)

## 2014-03-26 LAB — GLUCOSE, POCT (MANUAL RESULT ENTRY): POC Glucose: 121 mg/dl — AB (ref 70–99)

## 2014-03-26 LAB — POCT SEDIMENTATION RATE: POCT SED RATE: 5 mm/hr (ref 0–22)

## 2014-03-26 MED ORDER — MECLIZINE HCL 12.5 MG PO TABS: 12.5000 mg | ORAL_TABLET | Freq: Three times a day (TID) | ORAL | Status: DC | PRN

## 2014-03-26 NOTE — Progress Notes (Signed)
This is a 63 year old gentleman who is originally from [place]. He works at the [employer].  Patient presents with dizziness which has been intermittent over the last week. He recently experienced this last Wednesday but the symptoms seemed to improve until today when her in. Patient says that the symptoms are not positional, he has no vomiting or nausea, no diplopia or loss of hearing and no ear pain.  Patient says she's had this problem in the past back in 2008. At that time he had an MRI he says and that was negative.  Patient's work requires going up and down ladders. He is not scheduled to work today.  Patient also has problems with right eye cataract and corneal opacity for which she's scheduled to have surgery in either September or October.  Patient's had problems with his urination the past and he took medicine to help with flow. He has not taken this in quite some time however. He denies chest pain or shortness of breath.  Patient does have a headache which is on the top of his head.  Objective: Alert, elderly gentleman in no acute distress. His English is good. Eyes: Injected both eyes with particular injection on the right eye medially. He is an opacity in the right lens which obstructs most of the view of the retina with only a small pinhole area of red reflex. The left fundus is normal although there is some erythema with injected vessels on the medial side of the sclera of his left eye. Extra ocular motion is normal pupils are equal and reactive to light and accommodation Ears:  Normal canals with small amount of cerumen, no distortion of TM's Oroph:  Uvula lifts centrally without deviation, tongue protrudes midline Neck:  No bruit, thyromegaly Chest: clear Heart:  Regular, no murmur Skin: No rash Extremities: 4 extremities well without asymmetry, no edema  MRI:  10/09/2007- small maxillary retention cyst or polyp;  No evidence of tumor,  hydrocephalus, hemorrhage, or infarction.  (reading found in radiology section of computer) Results for orders placed in visit on 03/26/14  GLUCOSE, POCT (MANUAL RESULT ENTRY)      Result Value Ref Range   POC Glucose 121 (*) 70 - 99 mg/dl  POCT CBC      Result Value Ref Range   WBC 6.9  4.6 - 10.2 K/uL   Lymph, poc 2.0  0.6 - 3.4   POC LYMPH PERCENT 28.3  10 - 50 %L   MID (cbc) 0.2  0 - 0.9   POC MID % 2.9  0 - 12 %M   POC Granulocyte 4.7  2 - 6.9   Granulocyte percent 68.8  37 - 80 %G   RBC 5.07  4.69 - 6.13 M/uL   Hemoglobin 14.9  14.1 - 18.1 g/dL   HCT, POC 45.9  43.5 - 53.7 %   MCV 90.5  80 - 97 fL   MCH, POC 29.4  27 - 31.2 pg   MCHC 32.4  31.8 - 35.4 g/dL   RDW, POC 13.3     Platelet Count, POC 291  142 - 424 K/uL   MPV 8.5  0 - 99.8 fL    Assessment:  This appears to be a benign vertigo illness although there is no positional component. There are no red flags suggesting a deep brain injury or process. There is some mild glucose elevation but this is not high enough to be causing any symptoms and has not changed  over the last year or 2.  Plan: Meclizine Recheck 48 hours if symptoms persist  Robyn Haber, MD

## 2014-03-26 NOTE — Patient Instructions (Signed)

## 2015-04-17 ENCOUNTER — Encounter: Payer: Self-pay | Admitting: Radiation Oncology

## 2015-04-17 NOTE — Progress Notes (Unsigned)
GU Location of Tumor / Histology: prostatic adenocarcinoma  If Prostate Cancer, Gleason Score is (3 + 4) and PSA is (4.76)  Kenneth Velez presented 01/13/14 with nocturia x 2 and having to push urine.   Biopsies of prostate (if applicable) revealed:    Past/Anticipated interventions by urology, if any: prostate biopsy and active surveillance  Past/Anticipated interventions by medical oncology, if any: unknown  Weight changes, if any: no  Bowel/Bladder complaints, if any: nocturia x 1, pushing to urinate, weak stream, mild ED, no dysuria, no hematuria    Nausea/Vomiting, if any: no  Pain issues, if any:  Low back pain  SAFETY ISSUES:  Prior radiation? no  Pacemaker/ICD? no  Possible current pregnancy? no  Is the patient on methotrexate? 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 PROSTATE VOLUME 29 cc.

## 2015-04-20 ENCOUNTER — Ambulatory Visit
Admission: RE | Admit: 2015-04-20 | Discharge: 2015-04-20 | Disposition: A | Discharge status: Home / Self Care | Payer: BLUE CROSS/BLUE SHIELD | Source: Ambulatory Visit | Attending: Radiation Oncology | Admitting: Radiation Oncology

## 2015-04-20 ENCOUNTER — Telehealth: Payer: Self-pay | Admitting: Radiation Oncology

## 2015-04-20 ENCOUNTER — Encounter: Payer: Self-pay | Admitting: Radiation Oncology

## 2015-04-20 VITALS — BP 161/80 | HR 67 | Resp 16 | Ht 65.0 in | Wt 180.1 lb

## 2015-04-20 DIAGNOSIS — C61 Malignant neoplasm of prostate: Secondary | ICD-10-CM

## 2015-04-20 DIAGNOSIS — Z510 Encounter for antineoplastic radiation therapy: Secondary | ICD-10-CM | POA: Diagnosis present

## 2015-04-20 HISTORY — DX: Malignant neoplasm of prostate: C61

## 2015-04-20 NOTE — Progress Notes (Signed)
Radiation Oncology         [phone] ________________________________  Initial Outpatient Consultation  Name: Kenneth Velez MRN: [id number]  Date: 04/20/2015  DOB: 1951/01/10  VO:HYWVP, Veneda Melter, MD  Kathie Rhodes, MD   REFERRING PHYSICIAN: Kathie Rhodes, MD  DIAGNOSIS: 64 y.o. gentleman with stage T1c adenocarcinoma of the prostate with a Gleason's score of 3+4 and a PSA of 6.24.    ICD-9-CM ICD-10-CM   1. Malignant neoplasm of prostate 185 C61     HISTORY OF PRESENT ILLNESS::Kenneth Velez is a 64 y.o. gentleman.  He was noted to have an elevated PSA of 4.76 by his primary care physician, Dr. Elder Cyphers.  Accordingly, he was referred for evaluation in urology by Dr. Karsten Ro on 08/25/14,  digital rectal examination was performed at that time revealing no nodules.  The patient proceeded to transrectal ultrasound with 12 biopsies of the prostate on 09/04/14.  The prostate volume measured 29.05 cc.  Out of 12 core biopsies, 3 were positive.  The maximum Gleason score was 3+4, and this was seen in left lateral mid and two cores from the left apex.  The patient did not want to pursue further treatment at the time of initial diagnosis, but was recommend by urology to do so. However, the patient did agree with following his PSA. On 03/30/15, his PSA was somewhat higher at 6.24.  The patient reviewed the biopsy results with his urologist and he has kindly been referred today for discussion of potential radiation treatment options.  PREVIOUS RADIATION THERAPY: No  PAST MEDICAL HISTORY:  has a past medical history of Arthritis and Prostate cancer.    PAST SURGICAL HISTORY: Past Surgical History  Procedure Laterality Date  . Total hip arthroplasty Left 09/21/2012    Dr French Ana  . Total hip arthroplasty Left 09/21/2012    Procedure: TOTAL HIP ARTHROPLASTY;  Surgeon: Yvette Rack., MD;  Location: [facility];  Service: Orthopedics;  Laterality: Left;  . Joint replacement    . Prostate biopsy       FAMILY HISTORY: family history is negative for Colon cancer, Pancreatic cancer, Rectal cancer, Stomach cancer, Esophageal cancer, and Cancer.  SOCIAL HISTORY:  reports that he has never smoked. He has never used smokeless tobacco. He reports that he does not drink alcohol or use illicit drugs.  ALLERGIES: Review of patient's allergies indicates no known allergies.  MEDICATIONS:  Current Outpatient Prescriptions  Medication Sig Dispense Refill  . atorvastatin (LIPITOR) 10 MG tablet Take 1 tablet (10 mg total) by mouth daily. 90 tablet 3  . ibuprofen (ADVIL,MOTRIN) 600 MG tablet Take 1 tablet (600 mg total) by mouth every 8 (eight) hours as needed for headache or moderate pain. 30 tablet 1  . meclizine (ANTIVERT) 12.5 MG tablet Take 1 tablet (12.5 mg total) by mouth 3 (three) times daily as needed for dizziness. 30 tablet 0   No current facility-administered medications for this encounter.    REVIEW OF SYSTEMS:  A 15 point review of systems is documented in the electronic medical record. This was obtained by the nursing staff. However, I reviewed this with the patient to discuss relevant findings and make appropriate changes.  Pertinent items are noted in HPI..  The patient completed an IPSS and IIEF questionnaire.  His IPSS score was 6 indicating mild urinary outflow obstructive symptoms.  He indicated that his erectile function is able to complete sexual activity on most attempts.  The patient presents with nocturia x 1, pushing to urinate, weak  stream, mild ED, no dysuria, and no hematuria. He also complains of lower back pain.   PHYSICAL EXAM: This patient is in no acute distress.  He is alert and oriented.   height is 5\' 5"  (1.651 m) and weight is 180 lb 1.6 oz (81.693 kg). His blood pressure is 161/80 and his pulse is 67. His respiration is 16.  He exhibits no respiratory distress or labored breathing.  He appears neurologically intact.  His mood is pleasant.  His affect is appropriate.   Please note the digital rectal exam findings described above.  KPS = 100  100 - Normal; no complaints; no evidence of disease. 90   - Able to carry on normal activity; minor signs or symptoms of disease. 80   - Normal activity with effort; some signs or symptoms of disease. 38   - Cares for self; unable to carry on normal activity or to do active work. 60   - Requires occasional assistance, but is able to care for most of his personal needs. 50   - Requires considerable assistance and frequent medical care. 64   - Disabled; requires special care and assistance. 63   - Severely disabled; hospital admission is indicated although death not imminent. 69   - Very sick; hospital admission necessary; active supportive treatment necessary. 10   - Moribund; fatal processes progressing rapidly. 0     - Dead  Karnofsky DA, Abelmann Caro, Craver LS and Burchenal [facility] [phone]) The use of the nitrogen mustards in the palliative treatment of carcinoma: with particular reference to bronchogenic carcinoma Cancer 1 634-56   LABORATORY DATA:  Lab Results  Component Value Date   WBC 6.9 03/26/2014   HGB 14.9 03/26/2014   HCT 45.9 03/26/2014   MCV 90.5 03/26/2014   PLT 242 09/24/2012   Lab Results  Component Value Date   NA 137 03/26/2014   K 4.5 03/26/2014   CL 105 03/26/2014   CO2 27 03/26/2014   Lab Results  Component Value Date   ALT 20 03/26/2014   AST 19 03/26/2014   ALKPHOS 54 03/26/2014   BILITOT 0.5 03/26/2014     RADIOGRAPHY: No results found.    IMPRESSION: This gentleman is a 64 y.o. gentleman with stage T1c adenocarcinoma of the prostate with a Gleason's score of 3+4 and a PSA of 6.24.  His T-Stage, Gleason's Score, and PSA put him into the intermediate risk group.  Accordingly he is eligible for a variety of potential treatment options including seed implant.  PLAN: Today I reviewed the findings and workup thus far.  We discussed the natural history of prostate cancer.  We reviewed  the the implications of T-stage, Gleason's Score, and PSA on decision-making and outcomes in prostate cancer.  We discussed radiation treatment in the management of prostate cancer with regard to the logistics and delivery of external beam radiation treatment as well as the logistics and delivery of prostate brachytherapy.  We compared and contrasted each of these approaches and also compared these against prostatectomy.  The patient expressed interest in external beam radiotherapy.  I filled out a patient counseling form for him with relevant treatment diagrams and we retained a copy for our records.   I will share my findings with Dr. Karsten Ro. In the meantime, the patient would like to discuss his treatment options with his wife. I enjoyed meeting with him today, and will look forward to participating in the care of this very nice gentleman.  I spent time  face to face with the patient and more than 50% of that time was spent in counseling and/or coordination of care.  This document serves as a record of services personally performed by Tyler Pita, MD. It was created on his behalf by Darcus Austin, a trained medical scribe. The creation of this record is based on the scribe's personal observations and the provider's statements to them. This document has been checked and approved by the attending provider.       ------------------------------------------------  Sheral Apley Tammi Klippel, M.D.

## 2015-04-20 NOTE — Progress Notes (Signed)
See progress note under physician encounter. 

## 2015-04-20 NOTE — Telephone Encounter (Signed)
Patient has yet to show for consultation with Dr. Tammi Klippel. Phoned patient's cell to inquire. No answer. Left message requesting return call.

## 2015-04-30 ENCOUNTER — Ambulatory Visit: Payer: BLUE CROSS/BLUE SHIELD

## 2015-04-30 ENCOUNTER — Ambulatory Visit: Payer: BLUE CROSS/BLUE SHIELD | Admitting: Radiation Oncology

## 2015-06-12 ENCOUNTER — Telehealth: Payer: Self-pay | Admitting: *Deleted

## 2015-06-12 NOTE — Telephone Encounter (Signed)
Called patient to ask question, lvm for a return call 

## 2015-06-16 ENCOUNTER — Telehealth: Payer: Self-pay | Admitting: Radiation Oncology

## 2015-06-16 ENCOUNTER — Telehealth: Payer: Self-pay | Admitting: *Deleted

## 2015-06-16 NOTE — Telephone Encounter (Signed)
CALLED PATIENT TO INFORM OF GOLD SEED PLACEMENT ON 07-20-15- ARRIVAL TIME - 3:15 PM @ DR. OTTELIN'S OFFICE AND HIS SIM ON 07-31-15 @ 10 AM, SPOKE WITH PATIENT AND HE IS AWARE OF THESE APPTS.

## 2015-06-16 NOTE — Telephone Encounter (Signed)
Attempting to reach patient to make arrangements for treatment. No answer on patient's home or cell. Left message on both.

## 2015-07-01 ENCOUNTER — Ambulatory Visit: Payer: BLUE CROSS/BLUE SHIELD | Admitting: Radiation Oncology

## 2015-07-31 ENCOUNTER — Ambulatory Visit
Admission: RE | Admit: 2015-07-31 | Discharge: 2015-07-31 | Disposition: A | Discharge status: Home / Self Care | Payer: BLUE CROSS/BLUE SHIELD | Source: Ambulatory Visit | Attending: Radiation Oncology | Admitting: Radiation Oncology

## 2015-07-31 DIAGNOSIS — Z510 Encounter for antineoplastic radiation therapy: Secondary | ICD-10-CM | POA: Insufficient documentation

## 2015-07-31 DIAGNOSIS — C61 Malignant neoplasm of prostate: Secondary | ICD-10-CM | POA: Insufficient documentation

## 2015-07-31 NOTE — Progress Notes (Signed)
  Radiation Oncology         [phone] ________________________________  Name: Kenneth Velez MRN: [id number]  Date: 07/31/2015  DOB: [birth year]  SIMULATION AND TREATMENT PLANNING NOTE    ICD-9-CM ICD-10-CM   1. Malignant neoplasm of prostate ([place]) 185 C61     DIAGNOSIS:  64 y.o. gentleman with stage T1c adenocarcinoma of the prostate with a Gleason's score of 3+4 and a PSA of 6.24.  NARRATIVE:  The patient was brought to the [facility].  Identity was confirmed.  All relevant records and images related to the planned course of therapy were reviewed.  The patient freely provided informed written consent to proceed with treatment after reviewing the details related to the planned course of therapy. The consent form was witnessed and verified by the simulation staff.  Then, the patient was set-up in a stable reproducible supine position for radiation therapy.  A vacuum lock pillow device was custom fabricated to position his legs in a reproducible immobilized position.  Then, I performed a urethrogram under sterile conditions to identify the prostatic apex.  CT images were obtained.  Surface markings were placed.  The CT images were loaded into the planning software.  Then the prostate target and avoidance structures including the rectum, bladder, bowel and hips were contoured.  Treatment planning then occurred.  The radiation prescription was entered and confirmed.  A total of one complex treatment devices were fabricated. I have requested : Intensity Modulated Radiotherapy (IMRT) is medically necessary for this case for the following reason:  Rectal sparing.[name] Kitchen  PLAN:  The patient will receive 78 Gy in 40 fractions.  ________________________________  Sheral Apley Tammi Klippel, M.D.  This document serves as a record of services personally performed by Tyler Pita, MD. It was created on his behalf by Lendon Collar, a trained medical scribe. The creation of this record is based on  the scribe's personal observations and the provider's statements to them. This document has been checked and approved by the attending provider.

## 2015-08-05 DIAGNOSIS — Z510 Encounter for antineoplastic radiation therapy: Secondary | ICD-10-CM | POA: Diagnosis not present

## 2015-08-06 DIAGNOSIS — Z510 Encounter for antineoplastic radiation therapy: Secondary | ICD-10-CM | POA: Diagnosis not present

## 2015-08-12 ENCOUNTER — Ambulatory Visit
Admission: RE | Admit: 2015-08-12 | Discharge: 2015-08-12 | Disposition: A | Discharge status: Home / Self Care | Payer: BLUE CROSS/BLUE SHIELD | Source: Ambulatory Visit | Attending: Radiation Oncology | Admitting: Radiation Oncology

## 2015-08-12 DIAGNOSIS — Z510 Encounter for antineoplastic radiation therapy: Secondary | ICD-10-CM | POA: Diagnosis not present

## 2015-08-13 ENCOUNTER — Ambulatory Visit
Admission: RE | Admit: 2015-08-13 | Discharge: 2015-08-13 | Disposition: A | Discharge status: Home / Self Care | Payer: BLUE CROSS/BLUE SHIELD | Source: Ambulatory Visit | Attending: Radiation Oncology | Admitting: Radiation Oncology

## 2015-08-13 DIAGNOSIS — Z510 Encounter for antineoplastic radiation therapy: Secondary | ICD-10-CM | POA: Diagnosis not present

## 2015-08-14 ENCOUNTER — Ambulatory Visit
Admission: RE | Admit: 2015-08-14 | Discharge: 2015-08-14 | Disposition: A | Discharge status: Home / Self Care | Payer: BLUE CROSS/BLUE SHIELD | Source: Ambulatory Visit | Attending: Radiation Oncology | Admitting: Radiation Oncology

## 2015-08-14 VITALS — BP 149/66 | HR 71 | Resp 16 | Wt 186.5 lb

## 2015-08-14 DIAGNOSIS — C61 Malignant neoplasm of prostate: Secondary | ICD-10-CM

## 2015-08-14 DIAGNOSIS — Z510 Encounter for antineoplastic radiation therapy: Secondary | ICD-10-CM | POA: Diagnosis not present

## 2015-08-14 NOTE — Progress Notes (Signed)
  Radiation Oncology         [phone]   Name: Kenneth Velez MRN: [id number]   Date: 08/14/2015  DOB: February 11, 1951     Weekly Radiation Therapy Management    ICD-9-CM ICD-10-CM   1. Malignant neoplasm of prostate ([place]) 185 C61     Current Dose:  5.85 Gy  Planned Dose:  78 Gy  Narrative The patient presents for routine under treatment assessment.  Weight and vitals stable. Denies pain. Reports having to strain to completely empty his bladder. Denies dysuria or hematuria. Reports nocturia x 1. Denies diarrhea. Denies fatigue.   The patient is without complaint. Set-up films were reviewed. The chart was checked.  Physical Findings  weight is 186 lb 8 oz (84.596 kg). His blood pressure is 149/66 and his pulse is 71. His respiration is 16 and oxygen saturation is 98%. . Weight essentially stable.  No significant changes.  Impression The patient is tolerating radiation.  Plan Continue treatment as planned.         Sheral Apley Tammi Klippel, M.D.  This document serves as a record of services personally performed by Tyler Pita, MD. It was created on his behalf by Jenell Milliner, a trained medical scribe. The creation of this record is based on the scribe's personal observations and the provider's statements to them. This document has been checked and approved by the attending provider.

## 2015-08-14 NOTE — Progress Notes (Signed)
Weight and vitals stable. Denies pain. Reports having to strain to completely empty his bladder. Denies dysuria or hematuria. Reports nocturia x 1. Denies diarrhea. Denies fatigue.   BP 149/66 mmHg  Pulse 71  Resp 16  Wt 186 lb 8 oz (84.596 kg)  SpO2 98% Wt Readings from Last 3 Encounters:  08/14/15 186 lb 8 oz (84.596 kg)  04/20/15 180 lb 1.6 oz (81.693 kg)  03/26/14 175 lb (79.379 kg)

## 2015-08-17 ENCOUNTER — Ambulatory Visit
Admission: RE | Admit: 2015-08-17 | Discharge: 2015-08-17 | Disposition: A | Discharge status: Home / Self Care | Payer: BLUE CROSS/BLUE SHIELD | Source: Ambulatory Visit | Attending: Radiation Oncology | Admitting: Radiation Oncology

## 2015-08-17 DIAGNOSIS — Z510 Encounter for antineoplastic radiation therapy: Secondary | ICD-10-CM | POA: Diagnosis not present

## 2015-08-18 ENCOUNTER — Ambulatory Visit
Admission: RE | Admit: 2015-08-18 | Discharge: 2015-08-18 | Disposition: A | Discharge status: Home / Self Care | Payer: BLUE CROSS/BLUE SHIELD | Source: Ambulatory Visit | Attending: Radiation Oncology | Admitting: Radiation Oncology

## 2015-08-18 DIAGNOSIS — Z510 Encounter for antineoplastic radiation therapy: Secondary | ICD-10-CM | POA: Diagnosis not present

## 2015-08-18 NOTE — Progress Notes (Signed)
Prior to 08/14/2015 PUT with Dr. Tammi Klippel oriented patient to staff and routine of the clinic. Provided patient with RADIATION THERAPY AND YOU handbook then, reviewed pertinent information. Educated patient reference potential side effects and management such as fatigue, urinary bladder changes and diarrhea. Provided patient with my business card and encouraged him to call with needs. Answered all patient questions to the best of my ability. Patient verbalized understanding of all reviewed.

## 2015-08-19 ENCOUNTER — Ambulatory Visit
Admission: RE | Admit: 2015-08-19 | Discharge: 2015-08-19 | Disposition: A | Discharge status: Home / Self Care | Payer: BLUE CROSS/BLUE SHIELD | Source: Ambulatory Visit | Attending: Radiation Oncology | Admitting: Radiation Oncology

## 2015-08-19 DIAGNOSIS — Z510 Encounter for antineoplastic radiation therapy: Secondary | ICD-10-CM | POA: Diagnosis not present

## 2015-08-20 ENCOUNTER — Ambulatory Visit
Admission: RE | Admit: 2015-08-20 | Discharge: 2015-08-20 | Disposition: A | Discharge status: Home / Self Care | Payer: BLUE CROSS/BLUE SHIELD | Source: Ambulatory Visit | Attending: Radiation Oncology | Admitting: Radiation Oncology

## 2015-08-20 DIAGNOSIS — Z510 Encounter for antineoplastic radiation therapy: Secondary | ICD-10-CM | POA: Diagnosis not present

## 2015-08-21 ENCOUNTER — Encounter: Payer: Self-pay | Admitting: Radiation Oncology

## 2015-08-21 ENCOUNTER — Ambulatory Visit
Admission: RE | Admit: 2015-08-21 | Discharge: 2015-08-21 | Disposition: A | Discharge status: Home / Self Care | Payer: BLUE CROSS/BLUE SHIELD | Source: Ambulatory Visit | Attending: Radiation Oncology | Admitting: Radiation Oncology

## 2015-08-21 VITALS — BP 149/69 | HR 62 | Resp 16 | Wt 186.1 lb

## 2015-08-21 DIAGNOSIS — Z510 Encounter for antineoplastic radiation therapy: Secondary | ICD-10-CM | POA: Diagnosis not present

## 2015-08-21 DIAGNOSIS — C61 Malignant neoplasm of prostate: Secondary | ICD-10-CM

## 2015-08-21 NOTE — Patient Instructions (Signed)
Contact our office if you have any questions following today's appointment: [phone].  

## 2015-08-21 NOTE — Progress Notes (Signed)
  Radiation Oncology         [phone]   Name: Kenneth Velez MRN: [id number]   Date: 08/21/2015  DOB: [birth year]     Weekly Radiation Therapy Management    ICD-9-CM ICD-10-CM   1. Malignant neoplasm of prostate ([place]) 185 C61     Current Dose:  15.6 Gy  Planned Dose:  78 Gy  Narrative The patient presents for routine under treatment assessment.  Weight and vitals stable. Denies pain. Reports having to strain to completely empty his bladder. Denies dysuria or hematuria. Reports nocturia x 1. Denies diarrhea. Reports fatigue related to work. 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   Set-up films were reviewed. The chart was checked.  Physical Findings  weight is 186 lb 1.6 oz (84.414 kg). His blood pressure is 149/69 and his pulse is 62. His respiration is 16. . Weight essentially stable.  No significant changes.  Impression The patient is tolerating radiation.  Plan Continue treatment as planned, the patient will notify us of progressive symptoms of concern. We encouraged light exercise as a means of helping with his  Radiation-induced fatigue.      Sheral Apley Tammi Klippel, M.D.

## 2015-08-21 NOTE — Progress Notes (Signed)
Weight and vitals stable. Denies pain. Reports having to strain to completely empty his bladder. Denies dysuria or hematuria. Reports nocturia x 1. Denies diarrhea. Reports fatigue related to work. 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   BP 149/69 mmHg  Pulse 62  Resp 16  Wt 186 lb 1.6 oz (84.414 kg) Wt Readings from Last 3 Encounters:  08/21/15 186 lb 1.6 oz (84.414 kg)  08/14/15 186 lb 8 oz (84.596 kg)  04/20/15 180 lb 1.6 oz (81.693 kg)

## 2015-08-24 ENCOUNTER — Ambulatory Visit
Admission: RE | Admit: 2015-08-24 | Discharge: 2015-08-24 | Disposition: A | Discharge status: Home / Self Care | Payer: BLUE CROSS/BLUE SHIELD | Source: Ambulatory Visit | Attending: Radiation Oncology | Admitting: Radiation Oncology

## 2015-08-24 DIAGNOSIS — Z510 Encounter for antineoplastic radiation therapy: Secondary | ICD-10-CM | POA: Diagnosis not present

## 2015-08-25 ENCOUNTER — Ambulatory Visit
Admission: RE | Admit: 2015-08-25 | Discharge: 2015-08-25 | Disposition: A | Discharge status: Home / Self Care | Payer: BLUE CROSS/BLUE SHIELD | Source: Ambulatory Visit | Attending: Radiation Oncology | Admitting: Radiation Oncology

## 2015-08-25 DIAGNOSIS — Z510 Encounter for antineoplastic radiation therapy: Secondary | ICD-10-CM | POA: Diagnosis not present

## 2015-08-26 ENCOUNTER — Ambulatory Visit
Admission: RE | Admit: 2015-08-26 | Discharge: 2015-08-26 | Disposition: A | Discharge status: Home / Self Care | Payer: BLUE CROSS/BLUE SHIELD | Source: Ambulatory Visit | Attending: Radiation Oncology | Admitting: Radiation Oncology

## 2015-08-26 DIAGNOSIS — Z510 Encounter for antineoplastic radiation therapy: Secondary | ICD-10-CM | POA: Diagnosis not present

## 2015-08-27 ENCOUNTER — Ambulatory Visit
Admission: RE | Admit: 2015-08-27 | Discharge: 2015-08-27 | Disposition: A | Discharge status: Home / Self Care | Payer: BLUE CROSS/BLUE SHIELD | Source: Ambulatory Visit | Attending: Radiation Oncology | Admitting: Radiation Oncology

## 2015-08-27 DIAGNOSIS — Z510 Encounter for antineoplastic radiation therapy: Secondary | ICD-10-CM | POA: Diagnosis not present

## 2015-08-28 ENCOUNTER — Ambulatory Visit
Admission: RE | Admit: 2015-08-28 | Discharge: 2015-08-28 | Disposition: A | Discharge status: Home / Self Care | Payer: BLUE CROSS/BLUE SHIELD | Source: Ambulatory Visit | Attending: Radiation Oncology | Admitting: Radiation Oncology

## 2015-08-28 VITALS — BP 157/77 | HR 68 | Resp 16 | Wt 183.3 lb

## 2015-08-28 DIAGNOSIS — Z510 Encounter for antineoplastic radiation therapy: Secondary | ICD-10-CM | POA: Diagnosis not present

## 2015-08-28 DIAGNOSIS — C61 Malignant neoplasm of prostate: Secondary | ICD-10-CM

## 2015-08-28 NOTE — Patient Instructions (Signed)
Contact our office if you have any questions following today's appointment: [phone].  

## 2015-08-28 NOTE — Progress Notes (Signed)
  Radiation Oncology         [phone]   Name: Kenneth Velez MRN: [id number]   Date: 08/28/2015  DOB: [birth year]     Weekly Radiation Therapy Management    ICD-9-CM ICD-10-CM   1. Malignant neoplasm of prostate ([place]) 185 C61     Current Dose:  25.35Gy  Planned Dose:  78 Gy  Narrative The patient presents for routine under treatment assessment.  On review of systems the patient reports that he is doing pretty well overall but has been experiencing some crampy abdominal pain in the upper abdomen. He states his bowels are moving regularly. He denies any significant dysfunction of his GI tract. He denies any significant dysuria but has noted some burning and occasional nocturia. He denies hematuria. He is not experiencing nausea or vomiting. No other complaints or verbalized.   Set-up films were reviewed. The chart was checked.  Physical Findings  weight is 183 lb 4.8 oz (83.144 kg). His blood pressure is 157/77 and his pulse is 68. His respiration is 16 and oxygen saturation is 100%. . Weight essentially stable. No significant changes.  In general, this is a well-appearing African American male , he's alert and oriented 4 in appropriate throughout the examination. He has normal effort on cardiopulmonary assessment and is in no acute distress.   Impression [name] is a pleasant 65 y.o. gentleman with stage T1c adenocarcinoma of the prostate with a Gleason's score of 3+4 and a PSA of 6.24. He is tolerating radiation.  Plan Continue treatment as planned.we discussed these of over-the-counter Gas-X, heating pads are warm baths for his abdominal discomfort. He will keep [place] informed of any progressive symptoms.    The above documentation reflects my direct findings during this shared patient visit. Please see the separate note by Dr. Tammi Klippel on this date for the remainder of the patient's plan of care.  Carola Rhine, PAC   This document serves as a record of services personally  performed by Carola Rhine, PAC  and Tyler Pita, MD. It was created on their behalf by Jenell Milliner, a trained medical scribe. The creation of this record is based on the scribe's personal observations and the provider's statements to them. This document has been checked and approved by the attending provider.

## 2015-08-28 NOTE — Progress Notes (Addendum)
Three pound weight loss noted. Systolic bp slightly elevated. Reports occasional headache and stomach ache. Reports having to strain to completely empty his bladder. Denies dysuria or hematuria. Reports nocturia x 1. Denies diarrhea. Reports fatigue.  BP 157/77 mmHg  Pulse 68  Resp 16  Wt 183 lb 4.8 oz (83.144 kg)  SpO2 100% Wt Readings from Last 3 Encounters:  08/28/15 183 lb 4.8 oz (83.144 kg)  08/21/15 186 lb 1.6 oz (84.414 kg)  08/14/15 186 lb 8 oz (84.596 kg)

## 2015-08-31 ENCOUNTER — Ambulatory Visit
Admission: RE | Admit: 2015-08-31 | Discharge: 2015-08-31 | Disposition: A | Discharge status: Home / Self Care | Payer: BLUE CROSS/BLUE SHIELD | Source: Ambulatory Visit | Attending: Radiation Oncology | Admitting: Radiation Oncology

## 2015-08-31 DIAGNOSIS — Z510 Encounter for antineoplastic radiation therapy: Secondary | ICD-10-CM | POA: Diagnosis not present

## 2015-09-01 ENCOUNTER — Telehealth: Payer: Self-pay

## 2015-09-01 ENCOUNTER — Ambulatory Visit
Admission: RE | Admit: 2015-09-01 | Discharge: 2015-09-01 | Disposition: A | Discharge status: Home / Self Care | Payer: BLUE CROSS/BLUE SHIELD | Source: Ambulatory Visit | Attending: Radiation Oncology | Admitting: Radiation Oncology

## 2015-09-01 NOTE — Telephone Encounter (Signed)
Waiting on payment of $47.25 from [employer].

## 2015-09-02 ENCOUNTER — Ambulatory Visit
Admission: RE | Admit: 2015-09-02 | Discharge: 2015-09-02 | Disposition: A | Discharge status: Home / Self Care | Payer: BLUE CROSS/BLUE SHIELD | Source: Ambulatory Visit | Attending: Radiation Oncology | Admitting: Radiation Oncology

## 2015-09-02 DIAGNOSIS — Z510 Encounter for antineoplastic radiation therapy: Secondary | ICD-10-CM | POA: Insufficient documentation

## 2015-09-02 DIAGNOSIS — C61 Malignant neoplasm of prostate: Secondary | ICD-10-CM | POA: Insufficient documentation

## 2015-09-03 ENCOUNTER — Ambulatory Visit
Admission: RE | Admit: 2015-09-03 | Discharge: 2015-09-03 | Disposition: A | Discharge status: Home / Self Care | Payer: BLUE CROSS/BLUE SHIELD | Source: Ambulatory Visit | Attending: Radiation Oncology | Admitting: Radiation Oncology

## 2015-09-03 DIAGNOSIS — Z510 Encounter for antineoplastic radiation therapy: Secondary | ICD-10-CM | POA: Diagnosis not present

## 2015-09-04 ENCOUNTER — Ambulatory Visit
Admission: RE | Admit: 2015-09-04 | Discharge: 2015-09-04 | Disposition: A | Discharge status: Home / Self Care | Payer: BLUE CROSS/BLUE SHIELD | Source: Ambulatory Visit | Attending: Radiation Oncology | Admitting: Radiation Oncology

## 2015-09-04 VITALS — BP 143/70 | HR 71 | Temp 98.1°F | Ht 65.0 in | Wt 185.5 lb

## 2015-09-04 DIAGNOSIS — C61 Malignant neoplasm of prostate: Secondary | ICD-10-CM

## 2015-09-04 DIAGNOSIS — Z510 Encounter for antineoplastic radiation therapy: Secondary | ICD-10-CM | POA: Diagnosis not present

## 2015-09-04 NOTE — Progress Notes (Signed)
  Radiation Oncology         [phone]   Name: Kenneth Velez MRN: [id number]   Date: 09/04/2015  DOB: [birth year]     Weekly Radiation Therapy Management  No diagnosis found.  Current Dose:  35.1 Gy  Planned Dose:  78 Gy  Narrative The patient presents for routine under treatment assessment.  [name] has received 18 fractions to his pelvis. He reports stinging upon urination, strains to void, frequent flatulence, nocturia x 1, more frequency during the day.  He denies any hematuria. He has noticed that he has to pass gas in order to begin his urine stream. He reports fatigue and taking more naps.  Set-up films were reviewed. The chart was checked.  Physical Findings  height is 5\' 5"  (1.651 m) and weight is 185 lb 8 oz (84.142 kg). His temperature is 98.1 F (36.7 C). His blood pressure is 143/70 and his pulse is 71.   Pain scale 0/10  In general this is a well-appearing African male in no acute distress. He is alert and oriented 4 in appropriate throughout the examination.  Impression  Adenocarcinoma of the prostate. The patient is tolerating radiation.  Plan Continue treatment as planned.  His symptoms continue to be in line with would expect during the course of radiation and we will continue to follow this closely.    The above documentation reflects my direct findings during this shared patient visit. Please see the separate note by Dr. Tammi Klippel on this date for the remainder of the patient's plan of care.  Carola Rhine, PAC   This document serves as a record of services personally performed by Tyler Pita, MD. It was created on his behalf by Arlyce Harman, a trained medical scribe. The creation of this record is based on the scribe's personal observations and the provider's statements to them. This document has been checked and approved by the attending provider.

## 2015-09-04 NOTE — Progress Notes (Signed)
[name] has received 18 fractions to his pelvis.  He reports stinging upon urination, strains to void, frequent flatulence, nocturia x 1, more frequency during the day. No diarrhea nor proctitis. BP 143/70 mmHg  Pulse 71  Temp(Src) 98.1 F (36.7 C)  Ht 5\' 5"  (1.651 m)  Wt 185 lb 8 oz (84.142 kg)  BMI 30.87 kg/m2  Wt Readings from Last 3 Encounters:  09/04/15 185 lb 8 oz (84.142 kg)  08/28/15 183 lb 4.8 oz (83.144 kg)  08/21/15 186 lb 1.6 oz (84.414 kg)

## 2015-09-07 ENCOUNTER — Ambulatory Visit
Admission: RE | Admit: 2015-09-07 | Discharge: 2015-09-07 | Disposition: A | Discharge status: Home / Self Care | Payer: BLUE CROSS/BLUE SHIELD | Source: Ambulatory Visit | Attending: Radiation Oncology | Admitting: Radiation Oncology

## 2015-09-07 DIAGNOSIS — Z510 Encounter for antineoplastic radiation therapy: Secondary | ICD-10-CM | POA: Diagnosis not present

## 2015-09-08 ENCOUNTER — Ambulatory Visit
Admission: RE | Admit: 2015-09-08 | Discharge: 2015-09-08 | Disposition: A | Discharge status: Home / Self Care | Payer: BLUE CROSS/BLUE SHIELD | Source: Ambulatory Visit | Attending: Radiation Oncology | Admitting: Radiation Oncology

## 2015-09-08 DIAGNOSIS — Z510 Encounter for antineoplastic radiation therapy: Secondary | ICD-10-CM | POA: Diagnosis not present

## 2015-09-09 ENCOUNTER — Ambulatory Visit
Admission: RE | Admit: 2015-09-09 | Discharge: 2015-09-09 | Disposition: A | Discharge status: Home / Self Care | Payer: BLUE CROSS/BLUE SHIELD | Source: Ambulatory Visit | Attending: Radiation Oncology | Admitting: Radiation Oncology

## 2015-09-09 DIAGNOSIS — Z510 Encounter for antineoplastic radiation therapy: Secondary | ICD-10-CM | POA: Diagnosis not present

## 2015-09-10 ENCOUNTER — Ambulatory Visit
Admission: RE | Admit: 2015-09-10 | Discharge: 2015-09-10 | Disposition: A | Discharge status: Home / Self Care | Payer: BLUE CROSS/BLUE SHIELD | Source: Ambulatory Visit | Attending: Radiation Oncology | Admitting: Radiation Oncology

## 2015-09-10 ENCOUNTER — Encounter: Payer: Self-pay | Admitting: Radiation Oncology

## 2015-09-10 VITALS — BP 150/68 | HR 76 | Resp 16 | Wt 186.4 lb

## 2015-09-10 DIAGNOSIS — Z510 Encounter for antineoplastic radiation therapy: Secondary | ICD-10-CM | POA: Diagnosis not present

## 2015-09-10 DIAGNOSIS — C61 Malignant neoplasm of prostate: Secondary | ICD-10-CM

## 2015-09-10 NOTE — Progress Notes (Signed)
Weight and vitals stable. Denies pain. Reports dysuria so extreme he wants to cry. Reports nocturia x 4-5. Denies hematuria. Reports passing a small amount of  Slimy stool in pieces when he voids. Denies incontinence or leakage. Reports fatigue.   BP 150/68 mmHg  Pulse 76  Resp 16  Wt 186 lb 6.4 oz (84.55 kg)  SpO2 100% Wt Readings from Last 3 Encounters:  09/10/15 186 lb 6.4 oz (84.55 kg)  09/04/15 185 lb 8 oz (84.142 kg)  08/28/15 183 lb 4.8 oz (83.144 kg)

## 2015-09-10 NOTE — Progress Notes (Signed)
  Radiation Oncology         [phone]   Name: Kenneth Velez MRN: [id number]   Date: 09/10/2015  DOB: [birth year]     Weekly Radiation Therapy Management    ICD-9-CM ICD-10-CM   1. Malignant neoplasm of prostate ([place]) 185 C61     Current Dose:  42.9 Gy  Planned Dose:  78 Gy  Narrative The patient presents for routine under treatment assessment.  Weight and vitals stable. Denies pain. Reports dysuria so extreme he wants to cry. Reports nocturia x 4-5. Denies hematuria. Reports passing a small amount of Slimy stool in pieces when he voids. Denies incontinence or leakage. Reports fatigue  Set-up films were reviewed. The chart was checked.  Physical Findings  weight is 186 lb 6.4 oz (84.55 kg). His blood pressure is 150/68 and his pulse is 76. His respiration is 16 and oxygen saturation is 100%. . Weight essentially stable.  No significant changes.  Impression The patient is tolerating radiation.  Plan Continue treatment as planned, the patient will notify us of progressive symptoms of concern.        Sheral Apley Tammi Klippel, M.D.  This document serves as a record of services personally performed by Tyler Pita, MD. It was created on his behalf by Derek Mound, a trained medical scribe. The creation of this record is based on the scribe's personal observations and the provider's statements to them. This document has been checked and approved by the attending provider.

## 2015-09-11 ENCOUNTER — Ambulatory Visit
Admission: RE | Admit: 2015-09-11 | Discharge: 2015-09-11 | Disposition: A | Discharge status: Home / Self Care | Payer: BLUE CROSS/BLUE SHIELD | Source: Ambulatory Visit | Attending: Radiation Oncology | Admitting: Radiation Oncology

## 2015-09-11 DIAGNOSIS — Z510 Encounter for antineoplastic radiation therapy: Secondary | ICD-10-CM | POA: Diagnosis not present

## 2015-09-14 ENCOUNTER — Ambulatory Visit
Admission: RE | Admit: 2015-09-14 | Discharge: 2015-09-14 | Disposition: A | Discharge status: Home / Self Care | Payer: BLUE CROSS/BLUE SHIELD | Source: Ambulatory Visit | Attending: Radiation Oncology | Admitting: Radiation Oncology

## 2015-09-14 DIAGNOSIS — Z510 Encounter for antineoplastic radiation therapy: Secondary | ICD-10-CM | POA: Diagnosis not present

## 2015-09-15 ENCOUNTER — Ambulatory Visit
Admission: RE | Admit: 2015-09-15 | Discharge: 2015-09-15 | Disposition: A | Discharge status: Home / Self Care | Payer: BLUE CROSS/BLUE SHIELD | Source: Ambulatory Visit | Attending: Radiation Oncology | Admitting: Radiation Oncology

## 2015-09-15 DIAGNOSIS — Z510 Encounter for antineoplastic radiation therapy: Secondary | ICD-10-CM | POA: Diagnosis not present

## 2015-09-16 ENCOUNTER — Ambulatory Visit
Admission: RE | Admit: 2015-09-16 | Discharge: 2015-09-16 | Disposition: A | Discharge status: Home / Self Care | Payer: BLUE CROSS/BLUE SHIELD | Source: Ambulatory Visit | Attending: Radiation Oncology | Admitting: Radiation Oncology

## 2015-09-16 DIAGNOSIS — Z510 Encounter for antineoplastic radiation therapy: Secondary | ICD-10-CM | POA: Diagnosis not present

## 2015-09-17 ENCOUNTER — Ambulatory Visit
Admission: RE | Admit: 2015-09-17 | Discharge: 2015-09-17 | Disposition: A | Discharge status: Home / Self Care | Payer: BLUE CROSS/BLUE SHIELD | Source: Ambulatory Visit | Attending: Radiation Oncology | Admitting: Radiation Oncology

## 2015-09-17 DIAGNOSIS — Z510 Encounter for antineoplastic radiation therapy: Secondary | ICD-10-CM | POA: Diagnosis not present

## 2015-09-18 ENCOUNTER — Ambulatory Visit
Admission: RE | Admit: 2015-09-18 | Discharge: 2015-09-18 | Disposition: A | Discharge status: Home / Self Care | Payer: BLUE CROSS/BLUE SHIELD | Source: Ambulatory Visit | Attending: Radiation Oncology | Admitting: Radiation Oncology

## 2015-09-18 VITALS — BP 149/70 | HR 68 | Resp 16 | Wt 185.1 lb

## 2015-09-18 DIAGNOSIS — Z510 Encounter for antineoplastic radiation therapy: Secondary | ICD-10-CM | POA: Diagnosis not present

## 2015-09-18 DIAGNOSIS — C61 Malignant neoplasm of prostate: Secondary | ICD-10-CM

## 2015-09-18 NOTE — Progress Notes (Signed)
  Radiation Oncology         [phone]   Name: Kenneth Velez MRN: [id number]   Date: 09/18/2015  DOB: [birth year]     Weekly Radiation Therapy Management    ICD-9-CM ICD-10-CM   1. Malignant neoplasm of prostate ([facility]) 185 C61     Current Dose:  54.6 Gy  Planned Dose:  78 Gy  Narrative The patient presents for routine under treatment assessment.  Weight and vitals stable. Denies pain. Reports dysuria is mild with the aid of AZO. Reports nocturia x 4-5. Denies hematuria. Reports passing a small amount of stool when he voids. Denies incontinence or leakage. Reports he now has to strain to pass urine. Reports fatigue seems less.  Set-up films were reviewed. The chart was checked.  Physical Findings  weight is 185 lb 1.6 oz (83.961 kg). His blood pressure is 149/70 and his pulse is 68. His respiration is 16 and oxygen saturation is 100%. . Weight essentially stable.  No significant changes.  Impression The patient is tolerating radiation.  Plan Continue treatment as planned, the patient will notify us of progressive symptoms of concern.  Prescribed Flomax.      Sheral Apley Tammi Klippel, M.D.  This document serves as a record of services personally performed by Tyler Pita, MD. It was created on his behalf by Derek Mound, a trained medical scribe. The creation of this record is based on the scribe's personal observations and the provider's statements to them. This document has been checked and approved by the attending provider.

## 2015-09-18 NOTE — Progress Notes (Addendum)
Weight and vitals stable. Denies pain. Reports dysuria is mild with the aid of AZO. Reports nocturia x 4-5. Denies hematuria. Reports passing a small amount of stool when he voids. Denies incontinence or leakage. Reports he now has to strain to pass urine. Reports fatigue seems less.  BP 149/70 mmHg  Pulse 68  Resp 16  Wt 185 lb 1.6 oz (83.961 kg)  SpO2 100% Wt Readings from Last 3 Encounters:  09/18/15 185 lb 1.6 oz (83.961 kg)  09/10/15 186 lb 6.4 oz (84.55 kg)  09/04/15 185 lb 8 oz (84.142 kg)

## 2015-09-20 MED ORDER — TAMSULOSIN HCL 0.4 MG PO CAPS: 0.4000 mg | ORAL_CAPSULE | Freq: Every day | ORAL | Status: DC

## 2015-09-21 ENCOUNTER — Telehealth: Payer: Self-pay | Admitting: Radiation Oncology

## 2015-09-21 ENCOUNTER — Ambulatory Visit
Admission: RE | Admit: 2015-09-21 | Discharge: 2015-09-21 | Disposition: A | Discharge status: Home / Self Care | Payer: BLUE CROSS/BLUE SHIELD | Source: Ambulatory Visit | Attending: Radiation Oncology | Admitting: Radiation Oncology

## 2015-09-21 DIAGNOSIS — Z510 Encounter for antineoplastic radiation therapy: Secondary | ICD-10-CM | POA: Diagnosis not present

## 2015-09-21 NOTE — Telephone Encounter (Signed)
Received message from patient his Flomax wasn't at his pharmacy for pick up. Noted script was escribed Sunday. Phoned patient back. He reports he was able to pick up the script today from [facility]. He expressed appreciation for the call.

## 2015-09-22 ENCOUNTER — Ambulatory Visit
Admission: RE | Admit: 2015-09-22 | Discharge: 2015-09-22 | Disposition: A | Discharge status: Home / Self Care | Payer: BLUE CROSS/BLUE SHIELD | Source: Ambulatory Visit | Attending: Radiation Oncology | Admitting: Radiation Oncology

## 2015-09-22 DIAGNOSIS — Z510 Encounter for antineoplastic radiation therapy: Secondary | ICD-10-CM | POA: Diagnosis not present

## 2015-09-23 ENCOUNTER — Ambulatory Visit
Admission: RE | Admit: 2015-09-23 | Discharge: 2015-09-23 | Disposition: A | Discharge status: Home / Self Care | Payer: BLUE CROSS/BLUE SHIELD | Source: Ambulatory Visit | Attending: Radiation Oncology | Admitting: Radiation Oncology

## 2015-09-23 DIAGNOSIS — Z510 Encounter for antineoplastic radiation therapy: Secondary | ICD-10-CM | POA: Diagnosis not present

## 2015-09-24 ENCOUNTER — Ambulatory Visit
Admission: RE | Admit: 2015-09-24 | Discharge: 2015-09-24 | Disposition: A | Discharge status: Home / Self Care | Payer: BLUE CROSS/BLUE SHIELD | Source: Ambulatory Visit | Attending: Radiation Oncology | Admitting: Radiation Oncology

## 2015-09-24 DIAGNOSIS — Z510 Encounter for antineoplastic radiation therapy: Secondary | ICD-10-CM | POA: Diagnosis not present

## 2015-09-25 ENCOUNTER — Ambulatory Visit
Admission: RE | Admit: 2015-09-25 | Discharge: 2015-09-25 | Disposition: A | Discharge status: Home / Self Care | Payer: BLUE CROSS/BLUE SHIELD | Source: Ambulatory Visit | Attending: Radiation Oncology | Admitting: Radiation Oncology

## 2015-09-25 ENCOUNTER — Encounter: Payer: Self-pay | Admitting: Radiation Oncology

## 2015-09-25 VITALS — BP 133/66 | HR 72 | Temp 98.3°F | Resp 16 | Wt 183.9 lb

## 2015-09-25 DIAGNOSIS — C61 Malignant neoplasm of prostate: Secondary | ICD-10-CM

## 2015-09-25 DIAGNOSIS — Z510 Encounter for antineoplastic radiation therapy: Secondary | ICD-10-CM | POA: Diagnosis not present

## 2015-09-25 NOTE — Progress Notes (Signed)
  Radiation Oncology         [phone]   Name: Kenneth Velez MRN: [id number]   Date: 09/25/2015  DOB: [birth year]     Weekly Radiation Therapy Management    ICD-9-CM ICD-10-CM   1. Malignant neoplasm of prostate ([place]) 185 C61     Current Dose:  64.35 Gy  Planned Dose:  78 Gy  Narrative The patient presents for routine under treatment assessment.  Weight and vitals stable. Denies pain. Reports dysuria is better with Pyridium. Reports nocturia x 3. Denies hematuria. Reports passing a small amount of stool when he voids. Denies incontinence or leakage. Reports he no longer has to strain to pass urine. Reports increased fatigue.  Set-up films were reviewed. The chart was checked.  Physical Findings  weight is 183 lb 14.4 oz (83.416 kg). His oral temperature is 98.3 F (36.8 C). His blood pressure is 133/66 and his pulse is 72. His respiration is 16 and oxygen saturation is 100%. . Weight essentially stable.  No significant changes.  Impression The patient is tolerating radiation.  Plan Continue treatment as planned, the patient will notify us of progressive symptoms of concern.       Sheral Apley Tammi Klippel, M.D.  This document serves as a record of services personally performed by Kenneth Pita, MD. It was created on his behalf by Arlyce Harman, a trained medical scribe. The creation of this record is based on the scribe's personal observations and the provider's statements to them. This document has been checked and approved by the attending provider.

## 2015-09-25 NOTE — Progress Notes (Signed)
Weight and vitals stable. Denies pain. Reports dysuria is better with Pyridium. Reports nocturia x 3. Denies hematuria. Reports passing a small amount of stool when he voids. Denies incontinence or leakage. Reports he no longer has to strain to pass urine. Reports increased fatigue.   BP 133/66 mmHg  Pulse 72  Temp(Src) 98.3 F (36.8 C) (Oral)  Resp 16  Wt 183 lb 14.4 oz (83.416 kg)  SpO2 100% Wt Readings from Last 3 Encounters:  09/25/15 183 lb 14.4 oz (83.416 kg)  09/18/15 185 lb 1.6 oz (83.961 kg)  09/10/15 186 lb 6.4 oz (84.55 kg)

## 2015-09-28 ENCOUNTER — Ambulatory Visit
Admission: RE | Admit: 2015-09-28 | Discharge: 2015-09-28 | Disposition: A | Discharge status: Home / Self Care | Payer: BLUE CROSS/BLUE SHIELD | Source: Ambulatory Visit | Attending: Radiation Oncology | Admitting: Radiation Oncology

## 2015-09-28 DIAGNOSIS — Z510 Encounter for antineoplastic radiation therapy: Secondary | ICD-10-CM | POA: Diagnosis not present

## 2015-09-29 ENCOUNTER — Ambulatory Visit
Admission: RE | Admit: 2015-09-29 | Discharge: 2015-09-29 | Disposition: A | Discharge status: Home / Self Care | Payer: BLUE CROSS/BLUE SHIELD | Source: Ambulatory Visit | Attending: Radiation Oncology | Admitting: Radiation Oncology

## 2015-09-29 DIAGNOSIS — Z510 Encounter for antineoplastic radiation therapy: Secondary | ICD-10-CM | POA: Diagnosis not present

## 2015-09-30 ENCOUNTER — Ambulatory Visit
Admission: RE | Admit: 2015-09-30 | Discharge: 2015-09-30 | Disposition: A | Discharge status: Home / Self Care | Payer: BLUE CROSS/BLUE SHIELD | Source: Ambulatory Visit | Attending: Radiation Oncology | Admitting: Radiation Oncology

## 2015-09-30 DIAGNOSIS — Z510 Encounter for antineoplastic radiation therapy: Secondary | ICD-10-CM | POA: Diagnosis not present

## 2015-10-01 ENCOUNTER — Ambulatory Visit
Admission: RE | Admit: 2015-10-01 | Discharge: 2015-10-01 | Disposition: A | Discharge status: Home / Self Care | Payer: BLUE CROSS/BLUE SHIELD | Source: Ambulatory Visit | Attending: Radiation Oncology | Admitting: Radiation Oncology

## 2015-10-01 DIAGNOSIS — Z510 Encounter for antineoplastic radiation therapy: Secondary | ICD-10-CM | POA: Diagnosis not present

## 2015-10-02 ENCOUNTER — Ambulatory Visit
Admission: RE | Admit: 2015-10-02 | Discharge: 2015-10-02 | Disposition: A | Discharge status: Home / Self Care | Payer: BLUE CROSS/BLUE SHIELD | Source: Ambulatory Visit | Attending: Radiation Oncology | Admitting: Radiation Oncology

## 2015-10-02 VITALS — BP 135/64 | HR 67 | Resp 16 | Wt 182.1 lb

## 2015-10-02 DIAGNOSIS — C61 Malignant neoplasm of prostate: Secondary | ICD-10-CM | POA: Diagnosis present

## 2015-10-02 DIAGNOSIS — Z510 Encounter for antineoplastic radiation therapy: Secondary | ICD-10-CM | POA: Diagnosis not present

## 2015-10-02 NOTE — Progress Notes (Signed)
  Radiation Oncology         [id number]   Name: Kenneth Velez MRN: [id number]   Date: 10/02/2015  DOB: February 28, 1951     Weekly Radiation Therapy Management    ICD-9-CM ICD-10-CM   1. Malignant neoplasm of prostate ([place]) 185 C61     Current Dose:  74.1 Gy  Planned Dose:  78 Gy  Narrative The patient presents for routine under treatment assessment.  Weight and vitals stable. Denies pain. Reports dysuria is better with Pyridium. Reports nocturia x 2-3. Denies hematuria. Reports passing a small amount of stool when he voids. Denies incontinence or leakage. Reports he no longer has to strain to pass urine. Reports increased fatigue. One month follow up appointment card given. Understands to contact this RN with future needs.  Set-up films were reviewed. The chart was checked.  Physical Findings  weight is 182 lb 1.6 oz (82.6 kg). His blood pressure is 135/64 and his pulse is 67. His respiration is 16 and oxygen saturation is 100%. . Weight essentially stable.  No significant changes.  Impression The patient is tolerating radiation.  Plan Continue treatment as planned, the patient will notify us of progressive symptoms of concern.       Sheral Apley Tammi Klippel, M.D.  This document serves as a record of services personally performed by Tyler Pita, MD. It was created on his behalf by Lendon Collar, a trained medical scribe. The creation of this record is based on the scribe's personal observations and the provider's statements to them. This document has been checked and approved by the attending provider.

## 2015-10-02 NOTE — Telephone Encounter (Signed)
Payment received and records faxed over on 10/02/15

## 2015-10-02 NOTE — Progress Notes (Addendum)
Weight and vitals stable. Denies pain. Reports dysuria is better with Pyridium. Reports nocturia x 2-3. Denies hematuria. Reports passing a small amount of stool when he voids. Denies incontinence or leakage. Reports he no longer has to strain to pass urine. Reports increased fatigue. One month follow up appointment card given. Understands to contact this RN with future needs.   BP 135/64 mmHg  Pulse 67  Resp 16  Wt 182 lb 1.6 oz (82.6 kg)  SpO2 100% Wt Readings from Last 3 Encounters:  10/02/15 182 lb 1.6 oz (82.6 kg)  09/25/15 183 lb 14.4 oz (83.416 kg)  09/18/15 185 lb 1.6 oz (83.961 kg)

## 2015-10-05 ENCOUNTER — Ambulatory Visit
Admission: RE | Admit: 2015-10-05 | Discharge: 2015-10-05 | Disposition: A | Discharge status: Home / Self Care | Payer: BLUE CROSS/BLUE SHIELD | Source: Ambulatory Visit | Attending: Radiation Oncology | Admitting: Radiation Oncology

## 2015-10-05 DIAGNOSIS — Z510 Encounter for antineoplastic radiation therapy: Secondary | ICD-10-CM | POA: Diagnosis not present

## 2015-10-06 ENCOUNTER — Ambulatory Visit
Admission: RE | Admit: 2015-10-06 | Discharge: 2015-10-06 | Disposition: A | Discharge status: Home / Self Care | Payer: BLUE CROSS/BLUE SHIELD | Source: Ambulatory Visit | Attending: Radiation Oncology | Admitting: Radiation Oncology

## 2015-10-06 ENCOUNTER — Encounter: Payer: Self-pay | Admitting: Radiation Oncology

## 2015-10-06 DIAGNOSIS — Z510 Encounter for antineoplastic radiation therapy: Secondary | ICD-10-CM | POA: Diagnosis not present

## 2015-10-09 DIAGNOSIS — Z0271 Encounter for disability determination: Secondary | ICD-10-CM

## 2015-10-30 NOTE — Progress Notes (Signed)
  Radiation Oncology         [phone] ________________________________  Name: Kenneth Velez MRN: [id number]  Date: 10/06/2015  DOB: 1951-03-02  End of Treatment Note   ICD-9-CM ICD-10-CM    1. Malignant neoplasm of prostate ([place]) 185 C61     DIAGNOSIS: 65 y.o. gentleman with stage T1c adenocarcinoma of the prostate with a Gleason's score of 3+4 and a PSA of 6.24.     Indication for treatment:  Curative, Definitive Radiotherapy       Radiation treatment dates:   08/12/2015-10/06/2015  Site/dose:   The prostate was treated to 78 Gy in 40 fractions of 1.95 Gy  Beams/energy:   The patient was treated with IMRT using volumetric arc therapy delivering 6 MV X-rays to clockwise and counterclockwise circumferential arcs with a 90 degree collimator offset to avoid dose scalloping.  Image guidance was performed with daily cone beam CT prior to each fraction to align to gold markers in the prostate and assure proper bladder and rectal fill volumes.  Immobilization was achieved with BodyFix custom mold.  Narrative: The patient tolerated radiation treatment relatively well.   The patient experienced some modest fatigue and minor urinary irritation including nocturia and dysuria improved with Pyridium.    Plan: The patient has completed radiation treatment. He will return to radiation oncology clinic for routine followup in one month. I advised him to call or return sooner if he has any questions or concerns related to his recovery or treatment. ________________________________  Sheral Apley. Tammi Klippel, M.D.  This document serves as a record of services personally performed by Tyler Pita, MD. It was created on his behalf by Arlyce Harman, a trained medical scribe. The creation of this record is based on the scribe's personal observations and the provider's statements to them. This document has been checked and approved by the attending provider.

## 2015-11-09 ENCOUNTER — Ambulatory Visit
Admission: RE | Admit: 2015-11-09 | Discharge: 2015-11-09 | Disposition: A | Discharge status: Home / Self Care | Payer: BLUE CROSS/BLUE SHIELD | Source: Ambulatory Visit | Attending: Radiation Oncology | Admitting: Radiation Oncology

## 2015-11-09 VITALS — BP 140/45 | HR 67 | Temp 98.2°F | Ht 65.0 in | Wt 185.0 lb

## 2015-11-09 DIAGNOSIS — C61 Malignant neoplasm of prostate: Secondary | ICD-10-CM

## 2015-11-09 NOTE — Progress Notes (Addendum)
Radiation Oncology         [phone] ________________________________  Name: Kenneth Velez MRN: [id number]  Date: 11/09/2015  DOB: November 09, 1950  Follow-Up Visit Note  CC: GUEST, Veneda Melter, MD  Kathie Rhodes, MD  Diagnosis:   Stage T1c adenocarcinoma of the prostate with a Gleason's score of 3+4 and a PSA of 6.24, s/p XRT to the prostate.    ICD-9-CM ICD-10-CM   1. Malignant neoplasm of prostate ([facility]) 185 C61     Interval Since Last Radiation:  1  months 08/12/2015-10/06/2015: The prostate was treated to 78 Gy in 40 fractions of 1.95 Gy  Narrative:  The patient completed radiotherapy, and during the course of his treatment, he experienced symptoms of dysuria, and significant fatigue. He comes today for follow-up.     On review of systems, the patient reports that he is doing very well overall. He states that his fatigue is getting better, but is still noticeable during the day. He has noticed an incomplete sense of voiding at times however with repeated attempts, is able to completely empty his bladder. He states that he is not experiencing any dysuria at this time as he previously noted. He denies any hematuria. He has noticed an incomplete sense of being able to achieve and maintain an erection, and he states that although he is able to perform, his duration of sustaining erection is very short compared to what he would like. He is not experiencing any bowel dysfunction. He denies any chest pain or shortness of breath. A complete review of systems is obtained and is otherwise negative.                          ALLERGIES:  has No Known Allergies.  Meds: Current Outpatient Prescriptions  Medication Sig Dispense Refill  . phenazopyridine (PYRIDIUM) 95 MG tablet Take 95 mg by mouth 3 (three) times daily as needed for pain.    . tamsulosin (FLOMAX) 0.4 MG CAPS capsule Take 1 capsule (0.4 mg total) by mouth daily after supper. 30 capsule 5   No current facility-administered medications  for this encounter.    Physical Findings:  vitals were not taken for this visit.  Pain scale 0/10 In general this is a well appearing African male in no acute distress. He's alert and oriented x4 and appropriate throughout the examination. Cardiopulmonary assessment is negative for acute distress and he exhibits normal effort.   Radiographic Findings: No results found.  Impression/Plan: 1. Stage T1c adenocarcinoma of the prostate with a Gleason's score of 3+4 and a PSA of 6.24, s/p XRT to the prostate. The patient appears to be doing well since completion of radiotherapy. Clinically he is noticing improvement in dysuria, frequency, and although his nocturia continues, he feels that this is also somewhat improved. He will be following up with urology in 2 months time for PSA evaluation. We discussed that we would be happy to see him in the future if there are questions or concerns regarding his radiotherapy or should he additional radiation in the future. He states agreement and understanding. 2. Radiation induced fatigue. Again we discussed the importance of light exercise, the patient does have a physically demanding job, and has been unable in the past 2 exercise in addition to work. His fatigue is slowly improving and we educated the patient that this would continue over a matter of months before we would expect that this would resolve. He states agreement and understanding. 3.  Erectile Dysfunction. We discussed that this may be altered son by his recent radiotherapy treatment and inflammatory changes, and that if he is willing to wait to see if this improves over the next 1-2 months, this would be ideal, he states agreement and understanding. 4. Survivorship. The patient was counseled on the role for her survivorship for prostate cancer care. He is interested in meeting with our survivorship nurse practitioner, Mike Craze, NP. A referral was placed for this visit.     Carola Rhine,  PAC

## 2015-11-09 NOTE — Progress Notes (Signed)
Kenneth Velez reports improvement in urine flow since end of XRT-intermittent straining and slow stream, but reports complete emptying of bladder.  Nocturia x 2 on average.  Denies any proctitis nor loose/diarrheal stools. Less fatigue.  To see urologist within a 2-3 month period since the end of XRT

## 2015-12-14 ENCOUNTER — Telehealth: Payer: Self-pay | Admitting: Adult Health

## 2015-12-14 NOTE — Telephone Encounter (Signed)
I left a voicemail for Kenneth Velez letting him know that I needed to cancel his survivorship appt scheduled for this Wednesday, 12/16/15, due to a family emergency.  I encouraged him to call me back at his convenience so that we may reschedule his appt.  I look forward to hearing from him and participating in his care.   Mike Craze, NP [place] [phone]

## 2015-12-16 ENCOUNTER — Encounter: Payer: BLUE CROSS/BLUE SHIELD | Admitting: Adult Health

## 2016-04-30 ENCOUNTER — Other Ambulatory Visit: Payer: Self-pay | Admitting: Radiation Oncology

## 2016-11-01 ENCOUNTER — Ambulatory Visit (INDEPENDENT_AMBULATORY_CARE_PROVIDER_SITE_OTHER): Payer: BLUE CROSS/BLUE SHIELD | Admitting: Urgent Care

## 2016-11-01 VITALS — BP 120/68 | HR 60 | Temp 97.6°F | Resp 16 | Ht 65.0 in | Wt 176.6 lb

## 2016-11-01 DIAGNOSIS — Z23 Encounter for immunization: Secondary | ICD-10-CM

## 2016-11-01 DIAGNOSIS — Z0000 Encounter for general adult medical examination without abnormal findings: Secondary | ICD-10-CM | POA: Diagnosis not present

## 2016-11-01 DIAGNOSIS — E663 Overweight: Secondary | ICD-10-CM

## 2016-11-01 DIAGNOSIS — Z8546 Personal history of malignant neoplasm of prostate: Secondary | ICD-10-CM

## 2016-11-01 DIAGNOSIS — Z8639 Personal history of other endocrine, nutritional and metabolic disease: Secondary | ICD-10-CM | POA: Diagnosis not present

## 2016-11-01 NOTE — Progress Notes (Signed)
MRN: [id number]  Subjective:   [name] is a 66 y.o. male presenting for annual physical exam. Patient is married, has 4 children. Has good relationships at home, has a good support network. Works as an Electrical engineer. Denies smoking cigarettes. Drinks alcohol occasionally. Declines STI testing.   Medical care team includes: PCP: No PCP Per Patient Vision: No eye doctor. Last eye exam was >2 years ago. Dental: No dental care. Has dental insurance but does not use it consistently. Specialists: Urology, Dr. Tammi Klippel. He underwent radiation therapy for prostate cancer in 2016. Still sees urology every 6 months for PSA monitoring.  Kenneth Velez has a current medication list which includes the following prescription(s): tamsulosin. He has No Known Allergies. Kenneth Velez  has a past medical history of Arthritis and Prostate cancer ([place]). 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 Reports that his family history is unknown. He does not recall any diagnoses and they have since passed a long time ago.  Immunizations: Needs Prevnar 13 and tdap updated.  Review of Systems  Constitutional: Positive for malaise/fatigue (tires more easily at work). Negative for chills, diaphoresis, fever and weight loss.  HENT: Negative for congestion, ear discharge, ear pain, hearing loss, nosebleeds, sore throat and tinnitus.   Eyes: Negative for blurred vision, double vision, photophobia, pain, discharge and redness.  Respiratory: Negative for cough, shortness of breath and wheezing.   Cardiovascular: Negative for chest pain, palpitations and leg swelling.  Gastrointestinal: Negative for abdominal pain, blood in stool, constipation, diarrhea, nausea and vomiting.  Genitourinary: Negative for dysuria, flank pain, frequency, hematuria and urgency.  Musculoskeletal: Negative for back pain, joint  pain and myalgias.  Skin: Negative for itching and rash.  Neurological: Negative for dizziness, tingling, seizures, loss of consciousness, weakness and headaches.  Endo/Heme/Allergies: Negative for polydipsia.  Psychiatric/Behavioral: Negative for depression, hallucinations, memory loss, substance abuse and suicidal ideas. The patient is not nervous/anxious and does not have insomnia.    Objective:   Vitals: BP 120/68 (BP Location: Right Arm, Patient Position: Sitting, Cuff Size: Normal)   Pulse 60   Temp 97.6 F (36.4 C) (Oral)   Resp 16   Ht 5\' 5"  (1.651 m)   Wt 176 lb 9.6 oz (80.1 kg)   SpO2 98%   BMI 29.39 kg/m   Physical Exam  Constitutional: He is oriented to person, place, and time. He appears well-developed and well-nourished.  HENT:  TM's intact bilaterally, no effusions or erythema. Nasal turbinates pink and moist, nasal passages patent. No sinus tenderness. Oropharynx clear, mucous membranes moist, dentition in good repair.  Eyes: Conjunctivae and EOM are normal. Pupils are equal, round, and reactive to light. Right eye exhibits no discharge. Left eye exhibits no discharge. No scleral icterus.  Neck: Normal range of motion. Neck supple. No thyromegaly present.  Cardiovascular: Normal rate, regular rhythm and intact distal pulses.  Exam reveals no gallop and no friction rub.   No murmur heard. Pulmonary/Chest: No stridor. No respiratory distress. He has no wheezes. He has no rales.  Abdominal: Soft. Bowel sounds are normal. He exhibits no distension and no mass. There is no tenderness.  Musculoskeletal: Normal range of motion. He exhibits no edema or tenderness.  Lymphadenopathy:    He has no cervical adenopathy.  Neurological: He is alert and oriented to person, place, and time. He has normal reflexes. He displays normal reflexes. No cranial nerve deficit. Coordination normal.  Skin: Skin is warm and dry. Capillary refill takes less than 2 seconds. No rash noted. No  erythema. No pallor.  Psychiatric: He has a normal mood and affect.   Assessment and Plan :   1. Annual physical exam - Labs pending, patient is medically stable. Discussed healthy lifestyle, diet, exercise, preventative care, vaccinations, and addressed patient's concerns.   2. History of prostate cancer - PSA pending, continue f/u with urology  3. History of diabetes mellitus - a1c pending  4. Need for Tdap vaccination - Tdap vaccine greater than or equal to 7yo IM  5. Need for prophylactic vaccination against Streptococcus pneumoniae (pneumococcus) - Pneumococcal conjugate vaccine 13-valent IM   Jaynee Eagles, PA-C [facility] [phone] 11/01/2016  8:52 AM

## 2016-11-01 NOTE — Patient Instructions (Addendum)
Keeping you healthy  Get these tests  Blood pressure- Have your blood pressure checked once a year by your healthcare provider.  Normal blood pressure is 120/80  Weight- Have your body mass index (BMI) calculated to screen for obesity.  BMI is a measure of body fat based on height and weight. You can also calculate your own BMI at [email or web address]/.  Cholesterol- Have your cholesterol checked every year.  Diabetes- Have your blood sugar checked regularly if you have high blood pressure, high cholesterol, have a family history of diabetes or if you are overweight.  Screening for Colon Cancer- Colonoscopy starting at age 50.  Screening may begin sooner depending on your family history and other health conditions. Follow up colonoscopy as directed by your Gastroenterologist.  Screening for Prostate Cancer- Both blood work (PSA) and a rectal exam help screen for Prostate Cancer.  Screening begins at age 40 with African-American men and at age 50 with Caucasian men.  Screening may begin sooner depending on your family history.  Take these medicines  Aspirin- One aspirin daily can help prevent Heart disease and Stroke.  Flu shot- Every fall.  Tetanus- Every 10 years.  Zostavax- Once after the age of 60 to prevent Shingles.  Pneumonia shot- Once after the age of 65; if you are younger than 65, ask your healthcare provider if you need a Pneumonia shot.  Take these steps  Don't smoke- If you do smoke, talk to your doctor about quitting.  For tips on how to quit, go to www.smokefree.gov or call 1-800-QUIT-NOW.  Be physically active- Exercise 5 days a week for at least 30 minutes.  If you are not already physically active start slow and gradually work up to 30 minutes of moderate physical activity.  Examples of moderate activity include walking briskly, mowing the yard, dancing, swimming, bicycling, etc.  Eat a healthy diet- Eat a variety of healthy food such as fruits, vegetables,  low fat milk, low fat cheese, yogurt, lean meant, poultry, fish, beans, tofu, etc. For more information go to www.thenutritionsource.org  Drink alcohol in moderation- Limit alcohol intake to less than two drinks a day. Never drink and drive.  Dentist- Brush and floss twice daily; visit your dentist twice a year.  Depression- Your emotional health is as important as your physical health. If you're feeling down, or losing interest in things you would normally enjoy please talk to your healthcare provider.  Eye exam- Visit your eye doctor every year.  Safe sex- If you may be exposed to a sexually transmitted infection, use a condom.  Seat belts- Seat belts can save your life; always wear one.  Smoke/Carbon Monoxide detectors- These detectors need to be installed on the appropriate level of your home.  Replace batteries at least once a year.  Skin cancer- When out in the sun, cover up and use sunscreen 15 SPF or higher.  Violence- If anyone is threatening you, please tell your healthcare provider.  Living Will/ Health care power of attorney- Speak with your healthcare provider and family.   IF you received an x-ray today, you will receive an invoice from [facility]. Please contact [facility] at [phone] with questions or concerns regarding your invoice.   IF you received labwork today, you will receive an invoice from [facility]. Please contact [facility] at [phone] with questions or concerns regarding your invoice.   Our billing staff will not be able to assist you with questions regarding bills from these companies.  You will be contacted   with the lab results as soon as they are available. The fastest way to get your results is to activate your My Chart account. Instructions are located on the last page of this paperwork. If you have not heard from us regarding the results in 2 weeks, please contact this office.     

## 2016-11-02 ENCOUNTER — Encounter: Payer: Self-pay | Admitting: Urgent Care

## 2016-11-02 LAB — CBC WITH DIFFERENTIAL/PLATELET
Basophils Absolute: 0 10*3/uL (ref 0.0–0.2)
Basos: 1 %
EOS (ABSOLUTE): 0.2 10*3/uL (ref 0.0–0.4)
Eos: 3 %
HEMATOCRIT: 44.1 % (ref 37.5–51.0)
HEMOGLOBIN: 14.4 g/dL (ref 13.0–17.7)
Immature Grans (Abs): 0 10*3/uL (ref 0.0–0.1)
Immature Granulocytes: 0 %
LYMPHS ABS: 1.3 10*3/uL (ref 0.7–3.1)
Lymphs: 28 %
MCH: 29.7 pg (ref 26.6–33.0)
MCHC: 32.7 g/dL (ref 31.5–35.7)
MCV: 91 fL (ref 79–97)
MONOCYTES: 7 %
MONOS ABS: 0.3 10*3/uL (ref 0.1–0.9)
NEUTROS ABS: 2.8 10*3/uL (ref 1.4–7.0)
Neutrophils: 61 %
PLATELETS: 277 10*3/uL (ref 150–379)
RBC: 4.85 x10E6/uL (ref 4.14–5.80)
RDW: 13 % (ref 12.3–15.4)
WBC: 4.6 10*3/uL (ref 3.4–10.8)

## 2016-11-02 LAB — CMP14+EGFR
A/G RATIO: 2 (ref 1.2–2.2)
ALBUMIN: 4.5 g/dL (ref 3.6–4.8)
ALT: 18 IU/L (ref 0–44)
AST: 18 IU/L (ref 0–40)
Alkaline Phosphatase: 53 IU/L (ref 39–117)
BILIRUBIN TOTAL: 0.2 mg/dL (ref 0.0–1.2)
BUN / CREAT RATIO: 14 (ref 10–24)
BUN: 11 mg/dL (ref 8–27)
CALCIUM: 9.4 mg/dL (ref 8.6–10.2)
CHLORIDE: 101 mmol/L (ref 96–106)
CO2: 24 mmol/L (ref 18–29)
Creatinine, Ser: 0.78 mg/dL (ref 0.76–1.27)
GFR, EST AFRICAN AMERICAN: 110 mL/min/{1.73_m2} (ref >59)
GFR, EST NON AFRICAN AMERICAN: 95 mL/min/{1.73_m2} (ref >59)
GLUCOSE: 132 mg/dL — AB (ref 65–99)
Globulin, Total: 2.2 g/dL (ref 1.5–4.5)
Potassium: 4.6 mmol/L (ref 3.5–5.2)
Sodium: 142 mmol/L (ref 134–144)
TOTAL PROTEIN: 6.7 g/dL (ref 6.0–8.5)

## 2016-11-02 LAB — HEMOGLOBIN A1C
Est. average glucose Bld gHb Est-mCnc: 151 mg/dL
Hgb A1c MFr Bld: 6.9 % — ABNORMAL HIGH (ref 4.8–5.6)

## 2016-11-02 LAB — PSA: PROSTATE SPECIFIC AG, SERUM: 0.6 ng/mL (ref 0.0–4.0)

## 2016-11-02 LAB — LIPID PANEL
Chol/HDL Ratio: 4 ratio (ref 0.0–5.0)
Cholesterol, Total: 201 mg/dL — ABNORMAL HIGH (ref 100–199)
HDL: 50 mg/dL (ref >39)
LDL Calculated: 123 mg/dL — ABNORMAL HIGH (ref 0–99)
TRIGLYCERIDES: 138 mg/dL (ref 0–149)
VLDL Cholesterol Cal: 28 mg/dL (ref 5–40)

## 2017-01-05 ENCOUNTER — Other Ambulatory Visit: Payer: Self-pay | Admitting: Radiation Oncology

## 2017-09-12 DIAGNOSIS — C61 Malignant neoplasm of prostate: Secondary | ICD-10-CM | POA: Diagnosis not present

## 2017-09-26 DIAGNOSIS — Z8546 Personal history of malignant neoplasm of prostate: Secondary | ICD-10-CM | POA: Diagnosis not present

## 2017-09-26 DIAGNOSIS — N5201 Erectile dysfunction due to arterial insufficiency: Secondary | ICD-10-CM | POA: Diagnosis not present

## 2018-03-05 DIAGNOSIS — N401 Enlarged prostate with lower urinary tract symptoms: Secondary | ICD-10-CM | POA: Diagnosis not present

## 2018-03-15 DIAGNOSIS — C61 Malignant neoplasm of prostate: Secondary | ICD-10-CM | POA: Diagnosis not present

## 2018-03-15 DIAGNOSIS — N5201 Erectile dysfunction due to arterial insufficiency: Secondary | ICD-10-CM | POA: Diagnosis not present

## 2018-04-12 DIAGNOSIS — M545 Low back pain: Secondary | ICD-10-CM | POA: Diagnosis not present

## 2018-04-12 DIAGNOSIS — M25551 Pain in right hip: Secondary | ICD-10-CM | POA: Diagnosis not present

## 2018-04-13 DIAGNOSIS — Z9889 Other specified postprocedural states: Secondary | ICD-10-CM | POA: Diagnosis not present

## 2018-04-13 DIAGNOSIS — H2513 Age-related nuclear cataract, bilateral: Secondary | ICD-10-CM | POA: Diagnosis not present

## 2018-04-13 DIAGNOSIS — H11002 Unspecified pterygium of left eye: Secondary | ICD-10-CM | POA: Diagnosis not present

## 2018-05-23 ENCOUNTER — Telehealth: Payer: Self-pay | Admitting: *Deleted

## 2018-05-23 NOTE — Telephone Encounter (Signed)
Patient stated he was feeling chest tightness for over a week with no other symptoms patient scheduled an appointment for Friday but advised patient he should go to urgent care to be evaluated before his appointment.    Patient stated he was going to go over to [facility] urgent care right away.  He will keep his appointment here on Friday to be evaluated

## 2018-05-24 ENCOUNTER — Encounter (HOSPITAL_COMMUNITY): Payer: Self-pay | Admitting: Emergency Medicine

## 2018-05-24 ENCOUNTER — Ambulatory Visit (HOSPITAL_COMMUNITY)
Admission: EM | Admit: 2018-05-24 | Discharge: 2018-05-24 | Disposition: A | Discharge status: Home / Self Care | Payer: PPO | Attending: Family Medicine | Admitting: Family Medicine

## 2018-05-24 DIAGNOSIS — R079 Chest pain, unspecified: Secondary | ICD-10-CM

## 2018-05-24 DIAGNOSIS — R0609 Other forms of dyspnea: Secondary | ICD-10-CM | POA: Diagnosis not present

## 2018-05-24 DIAGNOSIS — I499 Cardiac arrhythmia, unspecified: Secondary | ICD-10-CM

## 2018-05-24 NOTE — ED Provider Notes (Signed)
[place]    CSN: [id number] Arrival date & time: 05/24/18  0857     History   Chief Complaint Chief Complaint  Patient presents with  . Chest Pain    HPI Kenneth Velez is a 67 y.o. male.   HPI  Patient states he generally enjoys good health.  He goes to the [facility] primary care/urgent care if he has medical problems.  He is seen about once a year.  He does have a urologist that he sees regularly.  He is on Flomax.  He is on no other medications.  He does have a history of prostate cancer, treated. He is here for chest pressure.  Central chest.  Worse with exertion.  Better with rest.  No radiation.  This is a new symptom for him.  He also feels more fatigue with exercise.  In addition he is been having irregular heartbeats.  He has spells where he can feel his heart is skipping a beat.  When his heart skips a beat, and when he feels chest pressure, he would be briefly dizzy.  He is worried about his heart.  He is never had heart problems before.  He states he has no cardiac risk factors, specifically no family history, cigarette smoking, hypertension, diabetes.  His chart does reveal he has had an elevated hemoglobin A1c for years indicating a prediabetic condition.  He seems unaware of this. he has no nausea or vomiting.  History of acid reflux.  No coughing or chest congestion.  No underlying lung disease.  Past Medical History:  Diagnosis Date  . Arthritis   . Prostate cancer [facility])     Patient Active Problem List   Diagnosis Date Noted  . Malignant neoplasm of prostate ([place]) 04/20/2015  . Osteoarthritis of left hip 09/21/2012    Past Surgical History:  Procedure Laterality Date  . JOINT REPLACEMENT    . PROSTATE BIOPSY    . 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 TOTAL HIP ARTHROPLASTY Left 09/21/2012   Procedure: TOTAL HIP ARTHROPLASTY;  Surgeon: Yvette Rack., MD;  Location: [facility];  Service: Orthopedics;  Laterality: Left;       Home  Medications    Prior to Admission medications   Medication Sig Start Date End Date Taking? Authorizing Provider  tamsulosin (FLOMAX) 0.4 MG CAPS capsule take 1 capsule by mouth once daily AFTER SUPPER 05/01/16   Tyler Pita, MD    Family History Family History  Problem Relation Age of Onset  . Colon cancer Neg Hx   . Pancreatic cancer Neg Hx   . Rectal cancer Neg Hx   . Stomach cancer Neg Hx   . Esophageal cancer Neg Hx   . Cancer Neg Hx     Social History Social History   Tobacco Use  . Smoking status: Never Smoker  . Smokeless tobacco: Never Used  Substance Use Topics  . Alcohol use: No    Comment: occasional  . Drug use: No     Allergies   Patient has no known allergies.   Review of Systems Review of Systems  Constitutional: Negative for chills and fever.  HENT: Negative for dental problem, ear pain and sore throat.   Eyes: Negative for pain and visual disturbance.  Respiratory: Positive for shortness of breath. Negative for cough.   Cardiovascular: Positive for chest pain and palpitations.  Gastrointestinal: Negative for abdominal pain and vomiting.  Genitourinary: Negative for dysuria and hematuria.  Musculoskeletal:  Negative for arthralgias and back pain.  Skin: Negative for color change and rash.  Neurological: Positive for dizziness. Negative for seizures and syncope.  Psychiatric/Behavioral: Negative for sleep disturbance. The patient is nervous/anxious.   All other systems reviewed and are negative.    Physical Exam Triage Vital Signs ED Triage Vitals  Enc Vitals Group     BP 05/24/18 0912 (!) 144/77     Pulse Rate 05/24/18 0912 67     Resp 05/24/18 0912 18     Temp 05/24/18 0912 98.2 F (36.8 C)     Temp Source 05/24/18 0912 Oral     SpO2 05/24/18 0912 100 %     Weight --      Height --      Head Circumference --      Peak Flow --      Pain Score 05/24/18 0911 7     Pain Loc --      Pain Edu? --      Excl. in [place]? --    No data  found.  Updated Vital Signs BP (!) 144/77   Pulse 67   Temp 98.2 F (36.8 C) (Oral)   Resp 18   SpO2 100%      Physical Exam  Constitutional: He appears well-developed and well-nourished. No distress.  HENT:  Head: Normocephalic and atraumatic.  Mouth/Throat: Oropharynx is clear and moist.  Eyes: Pupils are equal, round, and reactive to light. Conjunctivae and EOM are normal.  Neck: Normal range of motion. No JVD present.  Cardiovascular: Normal rate, regular rhythm and normal pulses.  Extrasystoles are present.    No systolic murmur is present. No chest wall tenderness  Pulmonary/Chest: Effort normal and breath sounds normal. No respiratory distress. He has no rales.  Abdominal: Soft. Bowel sounds are normal. He exhibits no distension. There is no tenderness.  Musculoskeletal: Normal range of motion. He exhibits no edema.       Right lower leg: He exhibits no edema.       Left lower leg: He exhibits no edema.  Neurological: He is alert.  Skin: Skin is warm and dry.  Psychiatric: He has a normal mood and affect. His behavior is normal.     UC Treatments / Results  Labs (all labs ordered are listed, but only abnormal results are displayed) Labs Reviewed - No data to display  EKG Normal rate.  Normal rhythm.  1 PAC.  No ST or T wave changes.  Normal intervals. None  Radiology No results found.  Procedures Procedures (including critical care time)  Medications Ordered in UC Medications - No data to display  Initial Impression / Assessment and Plan / UC Course  I have reviewed the triage vital signs and the nursing notes.  Pertinent labs & imaging results that were available during my care of the patient were reviewed by me and considered in my medical decision making (see chart for details).    Discussed that his symptoms could indicate heart disease.  I explained that even though his EKG is normal at rest, it may not be normal with exercise.  He needs an  exercise test to further work-up his chest pain and dyspnea on exertion.  This would be done through his primary care office and not the urgent care center.  He does not need to be admitted to the hospital today.  He is asymptomatic at rest and has a normal EKG.  He is encouraged to keep his appointment with his  PCP for tomorrow.  I did call the PCP prior his leaving to explain his symptoms and his problems and make sure that they are aware of his visit.  I gave him a copy of his EKG to take with him for the PCP. Final Clinical Impressions(s) / UC Diagnoses   Final diagnoses:  Exertional chest pain  DOE (dyspnea on exertion)  Irregular heart beat     Discharge Instructions     I have spoken with your doctor office See them tomorrow as scheduled Stay at low activity level Avoid caffeine Go to ER if you become worse, or have any increase in the chest discomfort  Greater than 50% of this visit was spent in counseling and coordinating care.  Total face to face time:  45 minutes.  Reviewed and counseled regarding heart disease and reasons to go to the ER  ED Prescriptions    None     Controlled Substance Prescriptions [place] Controlled Substance Registry consulted? Not Applicable   Raylene Everts, MD 05/24/18 1322

## 2018-05-24 NOTE — Discharge Instructions (Signed)
I have spoken with your doctor office See them tomorrow as scheduled Stay at low activity level Avoid caffeine Go to ER if you become worse, or have any increase in the chest discomfort

## 2018-05-24 NOTE — ED Triage Notes (Signed)
PT reports exertional fatigue for 2 weeks.  PT reports chest pressure that is slightly worse with movement. PT endorses slight SOB. Chest pressure has been going on for 2 weeks as well. PT will see a PCP tomorrow.

## 2018-05-25 ENCOUNTER — Encounter: Payer: Self-pay | Admitting: Emergency Medicine

## 2018-05-25 ENCOUNTER — Other Ambulatory Visit: Payer: Self-pay

## 2018-05-25 ENCOUNTER — Ambulatory Visit (INDEPENDENT_AMBULATORY_CARE_PROVIDER_SITE_OTHER): Payer: PPO | Admitting: Emergency Medicine

## 2018-05-25 ENCOUNTER — Ambulatory Visit (INDEPENDENT_AMBULATORY_CARE_PROVIDER_SITE_OTHER): Payer: PPO

## 2018-05-25 VITALS — BP 150/64 | HR 74 | Temp 98.4°F | Resp 16 | Ht 64.5 in | Wt 184.4 lb

## 2018-05-25 DIAGNOSIS — R079 Chest pain, unspecified: Secondary | ICD-10-CM | POA: Diagnosis not present

## 2018-05-25 DIAGNOSIS — R0609 Other forms of dyspnea: Secondary | ICD-10-CM

## 2018-05-25 DIAGNOSIS — R002 Palpitations: Secondary | ICD-10-CM | POA: Diagnosis not present

## 2018-05-25 DIAGNOSIS — I209 Angina pectoris, unspecified: Secondary | ICD-10-CM | POA: Diagnosis not present

## 2018-05-25 MED ORDER — ASPIRIN EC 325 MG PO TBEC: 325.0000 mg | DELAYED_RELEASE_TABLET | Freq: Every day | ORAL | 0 refills | Status: DC

## 2018-05-25 MED ORDER — METOPROLOL SUCCINATE ER 50 MG PO TB24: 50.0000 mg | ORAL_TABLET | Freq: Every day | ORAL | 1 refills | Status: DC

## 2018-05-25 NOTE — Patient Instructions (Addendum)
   If you have lab work done today you will be contacted with your lab results within the next 2 weeks.  If you have not heard from us then please contact us. The fastest way to get your results is to register for My Chart.   IF you received an x-ray today, you will receive an invoice from [facility]. Please contact [facility] at [phone] with questions or concerns regarding your invoice.   IF you received labwork today, you will receive an invoice from [facility]. Please contact [facility] at [phone] with questions or concerns regarding your invoice.   Our billing staff will not be able to assist you with questions regarding bills from these companies.  You will be contacted with the lab results as soon as they are available. The fastest way to get your results is to activate your My Chart account. Instructions are located on the last page of this paperwork. If you have not heard from us regarding the results in 2 weeks, please contact this office.      Angina Pectoris Angina pectoris is a very bad feeling in the chest, neck, or arm. Your doctor may call it angina. There are four types of angina. Angina is caused by a lack of blood in the middle and thickest layer of the heart wall (myocardium). Angina may feel like a crushing or squeezing pain in the chest. It may feel like tightness or heavy pressure in the chest. Some people say it feels like gas, heartburn, or indigestion. Some people have symptoms other than pain. These include:  Shortness of breath.  Cold sweats.  Feeling sick to your stomach (nausea).  Feeling light-headed.  Many women have chest discomfort and some of the other symptoms. However, women often have different symptoms, such as:  Feeling tired (fatigue).  Feeling nervous for no reason.  Feeling weak for no reason.  Dizziness or fainting.  Women may have angina without any symptoms. Follow these instructions at home:  Take  medicines only as told by your doctor.  Take care of other health issues as told by your doctor. These include: ? High blood pressure (hypertension). ? Diabetes.  Follow a heart-healthy diet. Your doctor can help you to choose healthy food options and make changes.  Talk to your doctor to learn more about healthy cooking methods and use them. These include: ? Roasting. ? Grilling. ? Broiling. ? Baking. ? Poaching. ? Steaming. ? Stir-frying.  Follow an exercise program approved by your doctor.  Keep a healthy weight. Lose weight as told by your doctor.  Rest when you are tired.  Learn to manage stress.  Do not use any tobacco, such as cigarettes, chewing tobacco, or electronic cigarettes. If you need help quitting, ask your doctor.  If you drink alcohol, and your doctor says it is okay, limit yourself to no more than 1 drink per day. One drink equals 12 ounces of beer, 5 ounces of wine, or 1 ounces of hard liquor.  Stop illegal drug use.  Keep all follow-up visits as told by your doctor. This is important. Do not take these medicines unless your doctor says that you can:  Nonsteroidal anti-inflammatory drugs (NSAIDs). These include: ? Ibuprofen. ? Naproxen. ? Celecoxib.  Vitamin supplements that have vitamin A, vitamin E, or both.  Hormone therapy that contains estrogen with or without progestin.  Get help right away if:  You have pain in your chest, neck, arm, jaw, stomach, or back that: ? Lasts   more than a few minutes. ? Comes back. ? Does not get better after you take medicine under your tongue (sublingual nitroglycerin).  You have any of these symptoms for no reason: ? Gas, heartburn, or indigestion. ? Sweating a lot. ? Shortness of breath or trouble breathing. ? Feeling sick to your stomach or throwing up. ? Feeling more tired than usual. ? Feeling nervous or worrying more than usual. ? Feeling weak. ? Diarrhea.  You are suddenly dizzy or  light-headed.  You faint or pass out. These symptoms may be an emergency. Do not wait to see if the symptoms will go away. Get medical help right away. Call your local emergency services (911 in the [place] Do not drive yourself to the hospital. This information is not intended to replace advice given to you by your health care provider. Make sure you discuss any questions you have with your health care provider. Document Released: 01/04/2008 Document Revised: 12/24/2015 Document Reviewed: 11/19/2013 Elsevier Interactive Patient Education  2017 [employer]  

## 2018-05-25 NOTE — Assessment & Plan Note (Signed)
Most likely cardiac in origin.  Needs cardiology evaluation and CAD work-up done.  Urgent referral placed.  Patient made aware of differential diagnosis.  Advised to rest at home this weekend and go to the emergency room if condition worsens.  Patient to be started on daily aspirin regimen as well as Lopressor 50 mg daily.

## 2018-05-25 NOTE — Progress Notes (Signed)
Kenneth Velez 68 y.o.   Chief Complaint  Patient presents with  . Fatigue    x 2 weeks patient was seen 05/24/2018 at [facility] ED for chest pain    HISTORY OF PRESENT ILLNESS: This is a 67 y.o. male first visit with me, complaining of fatigue, dyspnea on exertion with occasional chest pressure that started about 1 week ago.  Noticed symptoms 1 week ago while on the treadmill at the gym.  Started getting easily tired with difficulty breathing followed by heart racing and sweating.  Symptoms improve with rest.  Asymptomatic at rest.  Went to the emergency room yesterday.  EKG was normal.  No blood work or chest x-ray done.  Patient sent home.  Emergency doctor's note read as follows:  He is here for chest pressure.  Central chest.  Worse with exertion.  Better with rest.  No radiation.  This is a new symptom for him.  He also feels more fatigue with exercise.  In addition he is been having irregular heartbeats.  He has spells where he can feel his heart is skipping a beat.  When his heart skips a beat, and when he feels chest pressure, he would be briefly dizzy.  He is worried about his heart.  He is never had heart problems before.  He states he has no cardiac risk factors, specifically no family history, cigarette smoking, hypertension, diabetes.  His chart does reveal he has had an elevated hemoglobin A1c for years indicating a prediabetic condition.  He seems unaware of this. he has no nausea or vomiting.  History of acid reflux.  No coughing or chest congestion.  No underlying lung disease.  HPI   Prior to Admission medications   Medication Sig Start Date End Date Taking? Authorizing Provider  tamsulosin (FLOMAX) 0.4 MG CAPS capsule take 1 capsule by mouth once daily AFTER SUPPER Patient not taking: Reported on 05/25/2018 05/01/16   Tyler Pita, MD    No Known Allergies  Patient Active Problem List   Diagnosis Date Noted  . Malignant neoplasm of prostate ([place]) 04/20/2015  .  Osteoarthritis of left hip 09/21/2012    Past Medical History:  Diagnosis Date  . Arthritis   . Prostate cancer [facility])     Past Surgical History:  Procedure Laterality Date  . JOINT REPLACEMENT    . PROSTATE BIOPSY    . 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 TOTAL HIP ARTHROPLASTY Left 09/21/2012   Procedure: TOTAL HIP ARTHROPLASTY;  Surgeon: Yvette Rack., MD;  Location: [place];  Service: Orthopedics;  Laterality: Left;    Social History   Socioeconomic History  . Marital status: Married    Spouse name: Not on file  . Number of children: Not on file  . Years of education: Not on file  . Highest education level: Not on file  Occupational History  . Not on file  Social Needs  . Financial resource strain: Not on file  . Food insecurity:    Worry: Not on file    Inability: Not on file  . Transportation needs:    Medical: Not on file    Non-medical: Not on file  Tobacco Use  . Smoking status: Never Smoker  . Smokeless tobacco: Never Used  Substance and Sexual Activity  . Alcohol use: No    Comment: occasional  . Drug use: No  . Sexual activity: Yes  Lifestyle  . Physical activity:    Days per  week: Not on file    Minutes per session: Not on file  . Stress: Not on file  Relationships  . Social connections:    Talks on phone: Not on file    Gets together: Not on file    Attends religious service: Not on file    Active member of club or organization: Not on file    Attends meetings of clubs or organizations: Not on file    Relationship status: Not on file  . Intimate partner violence:    Fear of current or ex partner: Not on file    Emotionally abused: Not on file    Physically abused: Not on file    Forced sexual activity: Not on file  Other Topics Concern  . Not on file  Social History Narrative  . Not on file    Family History  Problem Relation Age of Onset  . Colon cancer Neg Hx   . Pancreatic cancer Neg Hx   . Rectal cancer Neg Hx     . Stomach cancer Neg Hx   . Esophageal cancer Neg Hx   . Cancer Neg Hx      Review of Systems  Constitutional: Negative.  Negative for chills and fever.  HENT: Negative.  Negative for sore throat.   Eyes: Negative.  Negative for blurred vision and double vision.  Respiratory: Positive for shortness of breath. Negative for cough, hemoptysis and wheezing.   Cardiovascular: Positive for chest pain and palpitations. Negative for orthopnea, claudication, leg swelling and PND.  Gastrointestinal: Negative.  Negative for abdominal pain, diarrhea, nausea and vomiting.  Genitourinary: Negative.  Negative for dysuria and urgency.  Musculoskeletal: Negative.  Negative for back pain, myalgias and neck pain.  Skin: Negative.  Negative for rash.  Neurological: Negative.  Negative for dizziness, sensory change, seizures, loss of consciousness and headaches.  Endo/Heme/Allergies: Negative.   All other systems reviewed and are negative.   Vitals:   05/25/18 1205  BP: (!) 150/64  Pulse: 74  Resp: 16  Temp: 98.4 F (36.9 C)  SpO2: 97%     Physical Exam  Constitutional: He is oriented to person, place, and time. He appears well-developed and well-nourished.  HENT:  Head: Normocephalic and atraumatic.  Mouth/Throat: Oropharynx is clear and moist.  Eyes: Pupils are equal, round, and reactive to light. Conjunctivae and EOM are normal.  Neck: Normal range of motion. Neck supple. No JVD present.  Cardiovascular: Normal rate, regular rhythm and normal heart sounds.  Pulmonary/Chest: Effort normal and breath sounds normal.  Abdominal: Soft. There is no tenderness.  Musculoskeletal: Normal range of motion. He exhibits no edema or tenderness.  Lymphadenopathy:    He has no cervical adenopathy.  Neurological: He is alert and oriented to person, place, and time. No sensory deficit. He exhibits normal muscle tone. Coordination normal.  Skin: Skin is warm and dry. Capillary refill takes less than 2  seconds.  Psychiatric: He has a normal mood and affect. His behavior is normal.  Vitals reviewed.   EKG: Normal sinus rhythm with ventricular rate 75.  No acute ischemic changes.  EKG unchanged from yesterday's.  A total of 40 minutes was spent in the room with the patient, greater than 50% of which was in counseling/coordination of care regarding differential diagnosis, management, medications, prognosis, and need for cardiology follow-up.  Advised to go to the emergency room over the weekend if no better or worse.  ASSESSMENT & PLAN: DOE (dyspnea on exertion) Most likely cardiac in  origin.  Needs cardiology evaluation and CAD work-up done.  Urgent referral placed.  Patient made aware of differential diagnosis.  Advised to rest at home this weekend and go to the emergency room if condition worsens.  Patient to be started on daily aspirin regimen as well as Lopressor 50 mg daily.  [place] was seen today for fatigue.  Diagnoses and all orders for this visit:  DOE (dyspnea on exertion) -     CBC with Differential/Platelet -     Comprehensive metabolic panel -     DG Chest 2 View; Future -     EKG 12-Lead -     Ambulatory referral to Cardiology  Angina pectoris ([place]) -     Discontinue: aspirin EC 325 MG tablet; Take 1 tablet (325 mg total) by mouth daily. -     Discontinue: metoprolol succinate (TOPROL-XL) 50 MG 24 hr tablet; Take 1 tablet (50 mg total) by mouth daily. Take with or immediately following a meal. -     aspirin EC 325 MG tablet; Take 1 tablet (325 mg total) by mouth daily. -     metoprolol succinate (TOPROL-XL) 50 MG 24 hr tablet; Take 1 tablet (50 mg total) by mouth daily. Take with or immediately following a meal.  Palpitations -     Discontinue: metoprolol succinate (TOPROL-XL) 50 MG 24 hr tablet; Take 1 tablet (50 mg total) by mouth daily. Take with or immediately following a meal. -     metoprolol succinate (TOPROL-XL) 50 MG 24 hr tablet; Take 1 tablet (50 mg total) by  mouth daily. Take with or immediately following a meal.    Patient Instructions       If you have lab work done today you will be contacted with your lab results within the next 2 weeks.  If you have not heard from [place] then please contact us. The fastest way to get your results is to register for My Chart.   IF you received an x-ray today, you will receive an invoice from [facility]. Please contact [facility] at [phone] with questions or concerns regarding your invoice.   IF you received labwork today, you will receive an invoice from [facility]. Please contact [facility] at [phone] with questions or concerns regarding your invoice.   Our billing staff will not be able to assist you with questions regarding bills from these companies.  You will be contacted with the lab results as soon as they are available. The fastest way to get your results is to activate your My Chart account. Instructions are located on the last page of this paperwork. If you have not heard from [place] regarding the results in 2 weeks, please contact this office.     Angina Pectoris Angina pectoris is a very bad feeling in the chest, neck, or arm. Your doctor may call it angina. There are four types of angina. Angina is caused by a lack of blood in the middle and thickest layer of the heart wall (myocardium). Angina may feel like a crushing or squeezing pain in the chest. It may feel like tightness or heavy pressure in the chest. Some people say it feels like gas, heartburn, or indigestion. Some people have symptoms other than pain. These include:  Shortness of breath.  Cold sweats.  Feeling sick to your stomach (nausea).  Feeling light-headed.  Many women have chest discomfort and some of the other symptoms. However, women often have different symptoms, such as:  Feeling tired (fatigue).  Feeling  nervous for no reason.  Feeling weak for no reason.  Dizziness or fainting.  Women  may have angina without any symptoms. Follow these instructions at home:  Take medicines only as told by your doctor.  Take care of other health issues as told by your doctor. These include: ? High blood pressure (hypertension). ? Diabetes.  Follow a heart-healthy diet. Your doctor can help you to choose healthy food options and make changes.  Talk to your doctor to learn more about healthy cooking methods and use them. These include: ? Roasting. ? Grilling. ? Broiling. ? Baking. ? Poaching. ? Steaming. ? Stir-frying.  Follow an exercise program approved by your doctor.  Keep a healthy weight. Lose weight as told by your doctor.  Rest when you are tired.  Learn to manage stress.  Do not use any tobacco, such as cigarettes, chewing tobacco, or electronic cigarettes. If you need help quitting, ask your doctor.  If you drink alcohol, and your doctor says it is okay, limit yourself to no more than 1 drink per day. One drink equals 12 ounces of beer, 5 ounces of wine, or 1 ounces of hard liquor.  Stop illegal drug use.  Keep all follow-up visits as told by your doctor. This is important. Do not take these medicines unless your doctor says that you can:  Nonsteroidal anti-inflammatory drugs (NSAIDs). These include: ? Ibuprofen. ? Naproxen. ? Celecoxib.  Vitamin supplements that have vitamin A, vitamin E, or both.  Hormone therapy that contains estrogen with or without progestin.  Get help right away if:  You have pain in your chest, neck, arm, jaw, stomach, or back that: ? Lasts more than a few minutes. ? Comes back. ? Does not get better after you take medicine under your tongue (sublingual nitroglycerin).  You have any of these symptoms for no reason: ? Gas, heartburn, or indigestion. ? Sweating a lot. ? Shortness of breath or trouble breathing. ? Feeling sick to your stomach or throwing up. ? Feeling more tired than usual. ? Feeling nervous or worrying more  than usual. ? Feeling weak. ? Diarrhea.  You are suddenly dizzy or light-headed.  You faint or pass out. These symptoms may be an emergency. Do not wait to see if the symptoms will go away. Get medical help right away. Call your local emergency services (911 in the [place] Do not drive yourself to the hospital. This information is not intended to replace advice given to you by your health care provider. Make sure you discuss any questions you have with your health care provider. Document Released: 01/04/2008 Document Revised: 12/24/2015 Document Reviewed: 11/19/2013 Elsevier Interactive Patient Education  2017 Elsevier Inc.      Agustina Caroli, MD [facility]

## 2018-05-26 LAB — COMPREHENSIVE METABOLIC PANEL
A/G RATIO: 2 (ref 1.2–2.2)
ALT: 21 IU/L (ref 0–44)
AST: 19 IU/L (ref 0–40)
Albumin: 4.6 g/dL (ref 3.6–4.8)
Alkaline Phosphatase: 52 IU/L (ref 39–117)
BUN/Creatinine Ratio: 16 (ref 10–24)
BUN: 16 mg/dL (ref 8–27)
Bilirubin Total: 0.3 mg/dL (ref 0.0–1.2)
CALCIUM: 9.9 mg/dL (ref 8.6–10.2)
CO2: 23 mmol/L (ref 20–29)
CREATININE: 0.99 mg/dL (ref 0.76–1.27)
Chloride: 104 mmol/L (ref 96–106)
GFR, EST AFRICAN AMERICAN: 91 mL/min/{1.73_m2} (ref >59)
GFR, EST NON AFRICAN AMERICAN: 79 mL/min/{1.73_m2} (ref >59)
GLOBULIN, TOTAL: 2.3 g/dL (ref 1.5–4.5)
Glucose: 251 mg/dL — ABNORMAL HIGH (ref 65–99)
POTASSIUM: 4.7 mmol/L (ref 3.5–5.2)
SODIUM: 142 mmol/L (ref 134–144)
TOTAL PROTEIN: 6.9 g/dL (ref 6.0–8.5)

## 2018-05-26 LAB — CBC WITH DIFFERENTIAL/PLATELET
BASOS: 1 %
Basophils Absolute: 0 10*3/uL (ref 0.0–0.2)
EOS (ABSOLUTE): 0.1 10*3/uL (ref 0.0–0.4)
EOS: 2 %
HEMATOCRIT: 42.5 % (ref 37.5–51.0)
HEMOGLOBIN: 13.8 g/dL (ref 13.0–17.7)
IMMATURE GRANS (ABS): 0 10*3/uL (ref 0.0–0.1)
IMMATURE GRANULOCYTES: 0 %
LYMPHS: 29 %
Lymphocytes Absolute: 1.5 10*3/uL (ref 0.7–3.1)
MCH: 29.2 pg (ref 26.6–33.0)
MCHC: 32.5 g/dL (ref 31.5–35.7)
MCV: 90 fL (ref 79–97)
MONOCYTES: 9 %
MONOS ABS: 0.5 10*3/uL (ref 0.1–0.9)
NEUTROS PCT: 59 %
Neutrophils Absolute: 3.2 10*3/uL (ref 1.4–7.0)
Platelets: 341 10*3/uL (ref 150–450)
RBC: 4.72 x10E6/uL (ref 4.14–5.80)
RDW: 12.5 % (ref 12.3–15.4)
WBC: 5.3 10*3/uL (ref 3.4–10.8)

## 2018-05-28 ENCOUNTER — Encounter: Payer: Self-pay | Admitting: *Deleted

## 2018-06-04 ENCOUNTER — Ambulatory Visit: Payer: PPO | Admitting: Cardiology

## 2018-06-04 ENCOUNTER — Encounter: Payer: Self-pay | Admitting: Cardiology

## 2018-06-04 VITALS — BP 128/74 | HR 64 | Ht 64.5 in | Wt 184.6 lb

## 2018-06-04 DIAGNOSIS — R002 Palpitations: Secondary | ICD-10-CM | POA: Diagnosis not present

## 2018-06-04 DIAGNOSIS — I208 Other forms of angina pectoris: Secondary | ICD-10-CM

## 2018-06-04 DIAGNOSIS — R0609 Other forms of dyspnea: Secondary | ICD-10-CM

## 2018-06-04 NOTE — Patient Instructions (Signed)
Medication Instructions:  Your physician recommends that you continue on your current medications as directed. Please refer to the Current Medication list given to you today.  If you need a refill on your cardiac medications before your next appointment, please call your pharmacy.   Lab work:  If you have labs (blood work) drawn today and your tests are completely normal, you will receive your results only by: [name] Kitchen MyChart Message (if you have MyChart) OR . A paper copy in the mail If you have any lab test that is abnormal or we need to change your treatment, we will call you to review the results.  Testing/Procedures: Your physician has requested that you have an echocardiogram. Echocardiography is a painless test that uses sound waves to create images of your heart. It provides your doctor with information about the size and shape of your heart and how well your heart's chambers and valves are working. This procedure takes approximately one hour. There are no restrictions for this procedure.  Your physician has requested that you have en exercise stress myoview. For further information please visit HugeFiesta.tn. Please follow instruction sheet, as given.  Your physician has recommended that you wear a Zio monitor, 2 week monitor. Zio monitors are medical devices that record the heart's electrical activity. Doctors most often use these monitors to diagnose arrhythmias. Arrhythmias are problems with the speed or rhythm of the heartbeat.  Follow-Up: As needed

## 2018-06-04 NOTE — Progress Notes (Signed)
Cardiology Office Note    Date:  06/04/2018   ID:  Jayceon Deny, Chevez 1951-01-25, MRN [id number]  PCP:  Horald Pollen, MD  Cardiologist:  Fransico Him, MD   Chief Complaint  Patient presents with  . Chest Pain  . Shortness of Breath  . Palpitations    History of Present Illness:  Kenneth Velez is a 67 y.o. male who is being seen today for the evaluation of Chest pain and exertional fatigue as well as palpiatations at the request of Ines Bloomer  This is a 67 year old male with history of prostate cancer and osteoarthritis of left hip status post left hip replacement.  He was in his usual state of health until about 2 to 3 months ago when he started having exertional chest pain.  He describes it as a tightness and pressure across his chest and radiating down to his left arm.  It only occurs with exertion and resolves with rest.  He occasionally will get short of breath diaphoretic and nauseated with the discomfort.  He is also noted some exertional fatigue recently.  He used to go to the gym 3 times a week and exercise but now he gets so fatigued with any kind of exertion.  He is also noted that his heart has been racing and at times feels very irregular.  He has no family history of heart disease and has never smoked.  Past Medical History:  Diagnosis Date  . Arthritis   . Malignant neoplasm of prostate ([place]) 04/20/2015  . Osteoarthritis of left hip 09/21/2012  . Prostate cancer [facility])     Past Surgical History:  Procedure Laterality Date  . JOINT REPLACEMENT    . PROSTATE BIOPSY    . 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 TOTAL HIP ARTHROPLASTY Left 09/21/2012   Procedure: TOTAL HIP ARTHROPLASTY;  Surgeon: Yvette Rack., MD;  Location: [facility];  Service: Orthopedics;  Laterality: Left;    Current Medications: Current Meds  Medication Sig  . aspirin EC 325 MG tablet Take 1 tablet (325 mg total) by mouth daily.  . metoprolol succinate  (TOPROL-XL) 50 MG 24 hr tablet Take 1 tablet (50 mg total) by mouth daily. Take with or immediately following a meal.    Allergies:   Patient has no known allergies.   Social History   Socioeconomic History  . Marital status: Married    Spouse name: Not on file  . Number of children: Not on file  . Years of education: Not on file  . Highest education level: Not on file  Occupational History  . Not on file  Social Needs  . Financial resource strain: Not on file  . Food insecurity:    Worry: Not on file    Inability: Not on file  . Transportation needs:    Medical: Not on file    Non-medical: Not on file  Tobacco Use  . Smoking status: Never Smoker  . Smokeless tobacco: Never Used  Substance and Sexual Activity  . Alcohol use: No    Comment: occasional  . Drug use: No  . Sexual activity: Yes  Lifestyle  . Physical activity:    Days per week: Not on file    Minutes per session: Not on file  . Stress: Not on file  Relationships  . Social connections:    Talks on phone: Not on file    Gets together: Not on file  Attends religious service: Not on file    Active member of club or organization: Not on file    Attends meetings of clubs or organizations: Not on file    Relationship status: Not on file  Other Topics Concern  . Not on file  Social History Narrative  . Not on file     Family History:  The patient's family history includes Colon cancer in his father; Diabetes in his mother.   ROS:   Please see the history of present illness.    ROS All other systems reviewed and are negative.  No flowsheet data found.     PHYSICAL EXAM:   VS:  BP 128/74   Pulse 64   Ht 5' 4.5" (1.638 m)   Wt 184 lb 9.6 oz (83.7 kg)   SpO2 98%   BMI 31.20 kg/m    GEN: Well nourished, well developed, in no acute distress  HEENT: normal  Neck: no JVD, carotid bruits, or masses Cardiac: RRR; no murmurs, rubs, or gallops,no edema.  Intact distal pulses bilaterally.    Respiratory:  clear to auscultation bilaterally, normal work of breathing GI: soft, nontender, nondistended, + BS MS: no deformity or atrophy  Skin: warm and dry, no rash Neuro:  Alert and Oriented x 3, Strength and sensation are intact Psych: euthymic mood, full affect  Wt Readings from Last 3 Encounters:  06/04/18 184 lb 9.6 oz (83.7 kg)  05/25/18 184 lb 6.4 oz (83.6 kg)  11/01/16 176 lb 9.6 oz (80.1 kg)      Studies/Labs Reviewed:   EKG:  EKG is not ordered today.  Recent Labs: 05/25/2018: ALT 21; BUN 16; Creatinine, Ser 0.99; Hemoglobin 13.8; Platelets 341; Potassium 4.7; Sodium 142   Lipid Panel    Component Value Date/Time   CHOL 201 (H) 11/01/2016 0910   TRIG 138 11/01/2016 0910   HDL 50 11/01/2016 0910   CHOLHDL 4.0 11/01/2016 0910   CHOLHDL 4.6 09/29/2013 1044   VLDL 29 09/29/2013 1044   LDLCALC 123 (H) 11/01/2016 0910    Additional studies/ records that were reviewed today include:  Hospital ER notes    ASSESSMENT:    1. Exertional angina (HCC)   2. Heart palpitations   3. DOE (dyspnea on exertion)      PLAN:  In order of problems listed above:  1. Exertional angina - he has had 2 to 75-month history of exertional chest pain with radiation to left arm associated with occasional shortness of breath and nausea.  It only occurs with exertion and no rest pain.  He has no family history of CAD and has never smoked.  He really has no cardiac risk factors except for age over 57 and male sex.  EKG in ER was normal with no ST changes.  His last lipids were done in 2018 showing a total cholesterol 201 HDL 50 LDL 123.  Triglycerides were normal EKG is nonischemic.  I recommended for a stress Myoview to rule out ischemia.   2.  Palpitations -these occur sporadically but usually.  I am going to get a ZIO patch to rule out arrhythmia.  3.  Dyspnea on exertion -check 2D echocardiogram to rule out structural heart disease.  NSTEMI #1 we will get stress Myoview to rule  out ischemia.    Medication Adjustments/Labs and Tests Ordered: Current medicines are reviewed at length with the patient today.  Concerns regarding medicines are outlined above.  Medication changes, Labs and Tests ordered today are listed in  the Patient Instructions below.  Patient Instructions  Medication Instructions:  Your physician recommends that you continue on your current medications as directed. Please refer to the Current Medication list given to you today.  If you need a refill on your cardiac medications before your next appointment, please call your pharmacy.   Lab work:  If you have labs (blood work) drawn today and your tests are completely normal, you will receive your results only by: [name] Kitchen MyChart Message (if you have MyChart) OR . A paper copy in the mail If you have any lab test that is abnormal or we need to change your treatment, we will call you to review the results.  Testing/Procedures: Your physician has requested that you have an echocardiogram. Echocardiography is a painless test that uses sound waves to create images of your heart. It provides your doctor with information about the size and shape of your heart and how well your heart's chambers and valves are working. This procedure takes approximately one hour. There are no restrictions for this procedure.  Your physician has requested that you have en exercise stress myoview. For further information please visit HugeFiesta.[email or web address]. Please follow instruction sheet, as given.  Your physician has recommended that you wear a Zio monitor, 2 week monitor. Zio monitors are medical devices that record the heart's electrical activity. Doctors most often use these monitors to diagnose arrhythmias. Arrhythmias are problems with the speed or rhythm of the heartbeat.  Follow-Up: As needed       Signed, Fransico Him, MD  06/04/2018 11:10 AM    [address], [place], [place]   [address] Phone: [phone]; Fax: [phone]

## 2018-06-07 ENCOUNTER — Telehealth (HOSPITAL_COMMUNITY): Payer: Self-pay | Admitting: *Deleted

## 2018-06-07 NOTE — Telephone Encounter (Signed)
Left message on voicemail in reference to upcoming appointment scheduled for 06/12/18. Phone number given for a call back so details instructions can be given. Kenneth Velez

## 2018-06-12 ENCOUNTER — Telehealth (HOSPITAL_COMMUNITY): Payer: Self-pay

## 2018-06-12 ENCOUNTER — Encounter (HOSPITAL_COMMUNITY): Payer: PPO

## 2018-06-12 NOTE — Telephone Encounter (Signed)
Spoke with the pt about his stress test, he stated that he understood and would be here. Kenneth Velez EMTP

## 2018-06-14 ENCOUNTER — Ambulatory Visit (HOSPITAL_BASED_OUTPATIENT_CLINIC_OR_DEPARTMENT_OTHER): Payer: PPO

## 2018-06-14 ENCOUNTER — Ambulatory Visit (HOSPITAL_COMMUNITY): Payer: PPO | Attending: Internal Medicine

## 2018-06-14 ENCOUNTER — Ambulatory Visit (INDEPENDENT_AMBULATORY_CARE_PROVIDER_SITE_OTHER): Payer: PPO

## 2018-06-14 ENCOUNTER — Other Ambulatory Visit: Payer: Self-pay

## 2018-06-14 DIAGNOSIS — R002 Palpitations: Secondary | ICD-10-CM | POA: Diagnosis not present

## 2018-06-14 DIAGNOSIS — I208 Other forms of angina pectoris: Secondary | ICD-10-CM

## 2018-06-14 LAB — MYOCARDIAL PERFUSION IMAGING
CHL CUP NUCLEAR SDS: 0
CHL CUP NUCLEAR SRS: 0
CHL CUP RESTING HR STRESS: 63 {beats}/min
CSEPED: 5 min
CSEPHR: 91 %
CSEPPHR: 141 {beats}/min
Estimated workload: 7 METS
Exercise duration (sec): 59 s
LV sys vol: 29 mL
LVDIAVOL: 79 mL (ref 62–150)
MPHR: 159 {beats}/min
NUC STRESS TID: 0.92
RPE: 18
SSS: 0

## 2018-06-14 MED ORDER — TECHNETIUM TC 99M TETROFOSMIN IV KIT
32.5000 | PACK | Freq: Once | INTRAVENOUS | Status: AC | PRN
  Administered 2018-06-14: 32.5 via INTRAVENOUS
  Filled 2018-06-14: qty 33

## 2018-06-14 MED ORDER — TECHNETIUM TC 99M TETROFOSMIN IV KIT
10.9000 | PACK | Freq: Once | INTRAVENOUS | Status: AC | PRN
  Administered 2018-06-14: 10.9 via INTRAVENOUS
  Filled 2018-06-14: qty 11

## 2018-06-26 ENCOUNTER — Ambulatory Visit: Payer: PPO | Admitting: Cardiology

## 2018-07-02 DIAGNOSIS — R002 Palpitations: Secondary | ICD-10-CM | POA: Diagnosis not present

## 2018-07-03 ENCOUNTER — Telehealth: Payer: Self-pay

## 2018-07-03 MED ORDER — METOPROLOL SUCCINATE ER 25 MG PO TB24: ORAL_TABLET | ORAL | 11 refills | Status: DC

## 2018-07-03 NOTE — Telephone Encounter (Signed)
Spoke with the patient, he expressed understanding about his results. The patient accepted the Toprol dose increase. He is scheduled with Ermalinda Barrios PA-C on 12/18. Advised the patient if symptoms worsen to call the back.

## 2018-07-03 NOTE — Telephone Encounter (Signed)
-----   Message from Sueanne Margarita, MD sent at 07/03/2018  1:11 PM EST ----- Please let patient know that heart monitor showed short runs of extra heart beats from the top of the heart and possibly some from bottom of her heart.  Echo showed normal LVF and no ischemia on stress test.  These are likely benign. PVC load < 1%.  Increase Toprol XL to 50mg  qam and 25mg  qpm.  Followup with extender in 2 weeks

## 2018-07-18 ENCOUNTER — Ambulatory Visit (INDEPENDENT_AMBULATORY_CARE_PROVIDER_SITE_OTHER): Payer: PPO | Admitting: Physician Assistant

## 2018-07-18 ENCOUNTER — Encounter: Payer: Self-pay | Admitting: Physician Assistant

## 2018-07-18 VITALS — BP 134/80 | HR 60 | Ht 64.5 in | Wt 186.0 lb

## 2018-07-18 DIAGNOSIS — R0609 Other forms of dyspnea: Secondary | ICD-10-CM

## 2018-07-18 DIAGNOSIS — I471 Supraventricular tachycardia, unspecified: Secondary | ICD-10-CM | POA: Insufficient documentation

## 2018-07-18 DIAGNOSIS — I472 Ventricular tachycardia: Secondary | ICD-10-CM | POA: Diagnosis not present

## 2018-07-18 DIAGNOSIS — R000 Tachycardia, unspecified: Secondary | ICD-10-CM | POA: Insufficient documentation

## 2018-07-18 DIAGNOSIS — R7309 Other abnormal glucose: Secondary | ICD-10-CM | POA: Diagnosis not present

## 2018-07-18 DIAGNOSIS — R002 Palpitations: Secondary | ICD-10-CM

## 2018-07-18 DIAGNOSIS — I208 Other forms of angina pectoris: Secondary | ICD-10-CM | POA: Diagnosis not present

## 2018-07-18 MED ORDER — ASPIRIN EC 81 MG PO TBEC: 81.0000 mg | DELAYED_RELEASE_TABLET | Freq: Every day | ORAL | 3 refills | Status: DC

## 2018-07-18 MED ORDER — METOPROLOL SUCCINATE ER 50 MG PO TB24: 50.0000 mg | ORAL_TABLET | Freq: Two times a day (BID) | ORAL | 3 refills | Status: DC

## 2018-07-18 NOTE — Progress Notes (Signed)
Cardiology Office Note    Date:  07/18/2018   ID:  Kenneth Velez, Kenneth Velez, MRN [id number]  PCP:  Horald Pollen, MD  Cardiologist: Fransico Him, MD EPS: None  Chief Complaint  Patient presents with  . Follow-up    History of Present Illness:  Kenneth Velez is a 66 y.o. male who Dr. Radford Pax saw 06/04/2018 for exertional chest pain radiating to his left arm as well as dyspnea on exertion and palpitations.  No family history of CAD and he has never smoked.  EKG without acute change.  2D echo normal LVEF 60 to 65% with mild LVH and grade 1 DD, nuclear stress test normal, 24-hour monitor showed sinus bradycardia, normal sinus rhythm and sinus tachycardia average heart rate 61 bpm range 41 to 231 bpm.  SVT up to 7 beats at a time, frequent PACs and wide-complex tachycardia up to 11 beats with bigeminal PVCs.  Dr. Radford Pax reviewed this monitor and placed the patient on Toprol XL 50 mg in the morning 20 5 in the afternoon.  PVC load less than 1%.  Patient comes in for follow-up.  He says he is marginally gotten a little better.  Still having fast heart rates chest pain and shortness of breath with activities.    Past Medical History:  Diagnosis Date  . Arthritis   . Malignant neoplasm of prostate ([place]) 04/20/2015  . Osteoarthritis of left hip 09/21/2012  . Prostate cancer [facility])     Past Surgical History:  Procedure Laterality Date  . JOINT REPLACEMENT    . PROSTATE BIOPSY    . 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 TOTAL HIP ARTHROPLASTY Left 09/21/2012   Procedure: TOTAL HIP ARTHROPLASTY;  Surgeon: Yvette Rack., MD;  Location: [place];  Service: Orthopedics;  Laterality: Left;    Current Medications: Current Meds  Medication Sig  . [DISCONTINUED] aspirin EC 325 MG tablet Take 1 tablet (325 mg total) by mouth daily.  . [DISCONTINUED] metoprolol succinate (TOPROL-XL) 25 MG 24 hr tablet Take 50 mg, 2 tablets in the morning, by mouth and take 25  mg, 1 tablet, in the evening, by mouth.     Allergies:   Patient has no known allergies.   Social History   Socioeconomic History  . Marital status: Married    Spouse name: Not on file  . Number of children: Not on file  . Years of education: Not on file  . Highest education level: Not on file  Occupational History  . Not on file  Social Needs  . Financial resource strain: Not on file  . Food insecurity:    Worry: Not on file    Inability: Not on file  . Transportation needs:    Medical: Not on file    Non-medical: Not on file  Tobacco Use  . Smoking status: Never Smoker  . Smokeless tobacco: Never Used  Substance and Sexual Activity  . Alcohol use: No    Comment: occasional  . Drug use: No  . Sexual activity: Yes  Lifestyle  . Physical activity:    Days per week: Not on file    Minutes per session: Not on file  . Stress: Not on file  Relationships  . Social connections:    Talks on phone: Not on file    Gets together: Not on file    Attends religious service: Not on file    Active member of club or organization: Not  on file    Attends meetings of clubs or organizations: Not on file    Relationship status: Not on file  Other Topics Concern  . Not on file  Social History Narrative  . Not on file     Family History:  The patientfamily history includes Colon cancer in his father; Diabetes in his mother.   ROS:   Please see the history of present illness.    Review of Systems  Constitution: Negative.  HENT: Negative.   Cardiovascular: Positive for chest pain and palpitations.  Respiratory: Negative.   Endocrine: Negative.   Hematologic/Lymphatic: Negative.   Musculoskeletal: Negative.   Gastrointestinal: Negative.   Genitourinary: Negative.   Neurological: Negative.    All other systems reviewed and are negative.   PHYSICAL EXAM:   VS:  BP 134/80   Pulse 60   Ht 5' 4.5" (1.638 m)   Wt 186 lb (84.4 kg)   BMI 31.43 kg/m   Physical Exam  GEN: Well  nourished, well developed, in no acute distress  Neck: no JVD, carotid bruits, or masses Cardiac:RRR; no murmurs, rubs, or gallops  Respiratory:  clear to auscultation bilaterally, normal work of breathing GI: soft, nontender, nondistended, + BS Ext: without cyanosis, clubbing, or edema, Good distal pulses bilaterally Neuro:  Alert and Oriented x 3 Psych: euthymic mood, full affect  Wt Readings from Last 3 Encounters:  07/18/18 186 lb (84.4 kg)  06/14/18 184 lb (83.5 kg)  06/04/18 184 lb 9.6 oz (83.7 kg)      Studies/Labs Reviewed:   EKG:  EKG is not ordered today.   Recent Labs: 05/25/2018: ALT 21; BUN 16; Creatinine, Ser 0.99; Hemoglobin 13.8; Platelets 341; Potassium 4.7; Sodium 142   Lipid Panel    Component Value Date/Time   CHOL 201 (H) 11/01/2016 0910   TRIG 138 11/01/2016 0910   HDL 50 11/01/2016 0910   CHOLHDL 4.0 11/01/2016 0910   CHOLHDL 4.6 09/29/2013 1044   VLDL 29 09/29/2013 1044   LDLCALC 123 (H) 11/01/2016 0910    Additional studies/ records that were reviewed today include:  Nuclear stress test 06/14/2018 Study Highlights    Nuclear stress EF: 63%.  There was no ST segment deviation noted during stress.  This is a low risk study.  The study is normal.     2D echo 11/14/2019Study Conclusions   - Left ventricle: The cavity size was normal. Wall thickness was   increased in a pattern of mild LVH. Systolic function was normal.   The estimated ejection fraction was in the range of 60% to 65%.   Wall motion was normal; there were no regional wall motion   abnormalities. Doppler parameters are consistent with abnormal   left ventricular relaxation (grade 1 diastolic dysfunction). The   E/e&' ratio is between 8-15, suggesting indeterminate LV filling   pressure. - Mitral valve: Mildly thickened leaflets . There was trivial   regurgitation. - Left atrium: The atrium was normal in size. - Tricuspid valve: There was mild regurgitation. - Pulmonary  arteries: PA peak pressure: 32 mm Hg (S). - Inferior vena cava: The vessel was normal in size. The   respirophasic diameter changes were in the normal range (>= 50%),   consistent with normal central venous pressure.   Impressions:   - LVEF 60-65%, mild LVH, normal wall motion, grasde 1 DD,   indeterminate LV filling pressure, trivial MR, normal LA size,   mild TR, RVSP 32 mmHg, normal IVC.    Holter monitor  06/14/2018 Study Highlights    Sinus bradycardia, normal sinus rhythm and sinus tachycardia. Average heart rate was 61 bpm. The heart rate ranged from 41 to 231 bpm.  SVT up to 7 beats at a time  Frequent PACs  Wide-complex tachycardia up to 11 beats.  Bigeminal PVCs    ASSESSMENT:    1. Exertional angina (HCC)   2. DOE (dyspnea on exertion)   3. Palpitations   4. SVT (supraventricular tachycardia) ([place])   5. Wide-complex tachycardia (HCC)   6. Elevated glucose      PLAN:  In order of problems listed above:  Exertional chest pain and dyspnea on exertion with normal nuclear stress test 06/14/2018 most likely secondary to arrhythmias   Palpitations with documented SVT up to 7 beats in wide-complex tachycardia up to 11 beats Dr. Radford Pax reviewed and felt they were benign with normal LV function and normal stress test and placed on Toprol.  Electrolytes were also normal.  Will increase Toprol to 50 mg twice daily.  Resting heart rate 60 today so do not want to go up any further.  If he continues to have symptoms may have to refer to EPS.  Follow-up with Dr. Radford Pax in January.  Elevated glucose 251 on our labs.  He needs work-up with PCP for diabetes.  He says he has been diagnosed with type 2 diabetes but does not take any medication.  He puts honey on everything.  Also eats a lot of fruits.      Medication Adjustments/Labs and Tests Ordered: Current medicines are reviewed at length with the patient today.  Concerns regarding medicines are outlined above.   Medication changes, Labs and Tests ordered today are listed in the Patient Instructions below. There are no Patient Instructions on file for this visit.   Signed, Ermalinda Barrios, PA-C  07/18/2018 2:06 PM    [facility] [address], [place], [place]  [address] Phone: [phone]; Fax: [phone]

## 2018-07-18 NOTE — Patient Instructions (Addendum)
Medication Instructions:  Your physician has recommended you make the following change in your medication:  1.) increase metoprolol succinate (Toprol XL) 50 mg twice a day 2.) stop aspirin 325 mg 3.) start aspirin 81 mg once a day  If you need a refill on your cardiac medications before your next appointment, please call your pharmacy.   Lab work: none If you have labs (blood work) drawn today and your tests are completely normal, you will receive your results only by: [name] Kitchen MyChart Message (if you have MyChart) OR . A paper copy in the mail If you have any lab test that is abnormal or we need to change your treatment, we will call you to review the results.  Testing/Procedures: none  Follow-Up: Your physician recommends that you schedule a follow-up appointment in: about 1 month with Dr. Radford Pax.  Dr. Theodosia Blender nurse will call you to schedule this appointment.  Any Other Special Instructions Will Be Listed Below (If Applicable).  Addendum: pt was instructed to schedule follow up with his PCP re: elevated glucose.    Diabetes Mellitus and Nutrition, Adult When you have diabetes (diabetes mellitus), it is very important to have healthy eating habits because your blood sugar (glucose) levels are greatly affected by what you eat and drink. Eating healthy foods in the appropriate amounts, at about the same times every day, can help you:  Control your blood glucose.  Lower your risk of heart disease.  Improve your blood pressure.  Reach or maintain a healthy weight. Every person with diabetes is different, and each person has different needs for a meal plan. Your health care provider may recommend that you work with a diet and nutrition specialist (dietitian) to make a meal plan that is best for you. Your meal plan may vary depending on factors such as:  The calories you need.  The medicines you take.  Your weight.  Your blood glucose, blood pressure, and cholesterol levels.  Your  activity level.  Other health conditions you have, such as heart or kidney disease. How do carbohydrates affect me? Carbohydrates, also called carbs, affect your blood glucose level more than any other type of food. Eating carbs naturally raises the amount of glucose in your blood. Carb counting is a method for keeping track of how many carbs you eat. Counting carbs is important to keep your blood glucose at a healthy level, especially if you use insulin or take certain oral diabetes medicines. It is important to know how many carbs you can safely have in each meal. This is different for every person. Your dietitian can help you calculate how many carbs you should have at each meal and for each snack. Foods that contain carbs include:  Bread, cereal, rice, pasta, and crackers.  Potatoes and corn.  Peas, beans, and lentils.  Milk and yogurt.  Fruit and juice.  Desserts, such as cakes, cookies, ice cream, and candy. How does alcohol affect me? Alcohol can cause a sudden decrease in blood glucose (hypoglycemia), especially if you use insulin or take certain oral diabetes medicines. Hypoglycemia can be a life-threatening condition. Symptoms of hypoglycemia (sleepiness, dizziness, and confusion) are similar to symptoms of having too much alcohol. If your health care provider says that alcohol is safe for you, follow these guidelines:  Limit alcohol intake to no more than 1 drink per day for nonpregnant women and 2 drinks per day for men. One drink equals 12 oz of beer, 5 oz of wine, or 1 oz of  hard liquor.  Do not drink on an empty stomach.  Keep yourself hydrated with water, diet soda, or unsweetened iced tea.  Keep in mind that regular soda, juice, and other mixers may contain a lot of sugar and must be counted as carbs. What are tips for following this plan?  Reading food labels  Start by checking the serving size on the "Nutrition Facts" label of packaged foods and drinks. The  amount of calories, carbs, fats, and other nutrients listed on the label is based on one serving of the item. Many items contain more than one serving per package.  Check the total grams (g) of carbs in one serving. You can calculate the number of servings of carbs in one serving by dividing the total carbs by 15. For example, if a food has 30 g of total carbs, it would be equal to 2 servings of carbs.  Check the number of grams (g) of saturated and trans fats in one serving. Choose foods that have low or no amount of these fats.  Check the number of milligrams (mg) of salt (sodium) in one serving. Most people should limit total sodium intake to less than 2,300 mg per day.  Always check the nutrition information of foods labeled as "low-fat" or "nonfat". These foods may be higher in added sugar or refined carbs and should be avoided.  Talk to your dietitian to identify your daily goals for nutrients listed on the label. Shopping  Avoid buying canned, premade, or processed foods. These foods tend to be high in fat, sodium, and added sugar.  Shop around the outside edge of the grocery store. This includes fresh fruits and vegetables, bulk grains, fresh meats, and fresh dairy. Cooking  Use low-heat cooking methods, such as baking, instead of high-heat cooking methods like deep frying.  Cook using healthy oils, such as olive, canola, or sunflower oil.  Avoid cooking with butter, cream, or high-fat meats. Meal planning  Eat meals and snacks regularly, preferably at the same times every day. Avoid going long periods of time without eating.  Eat foods high in fiber, such as fresh fruits, vegetables, beans, and whole grains. Talk to your dietitian about how many servings of carbs you can eat at each meal.  Eat 4-6 ounces (oz) of lean protein each day, such as lean meat, chicken, fish, eggs, or tofu. One oz of lean protein is equal to: ? 1 oz of meat, chicken, or fish. ? 1 egg. ?  cup of  tofu.  Eat some foods each day that contain healthy fats, such as avocado, nuts, seeds, and fish. Lifestyle  Check your blood glucose regularly.  Exercise regularly as told by your health care provider. This may include: ? 150 minutes of moderate-intensity or vigorous-intensity exercise each week. This could be brisk walking, biking, or water aerobics. ? Stretching and doing strength exercises, such as yoga or weightlifting, at least 2 times a week.  Take medicines as told by your health care provider.  Do not use any products that contain nicotine or tobacco, such as cigarettes and e-cigarettes. If you need help quitting, ask your health care provider.  Work with a Social worker or diabetes educator to identify strategies to manage stress and any emotional and social challenges. Questions to ask a health care provider  Do I need to meet with a diabetes educator?  Do I need to meet with a dietitian?  What number can I call if I have questions?  When are the  best times to check my blood glucose? Where to find more information:  American Diabetes Association: diabetes.org  Academy of Nutrition and Dietetics: www.eatright.[employer] of Diabetes and Digestive and Kidney Diseases (NIH): [employer] Summary  A healthy meal plan will help you control your blood glucose and maintain a healthy lifestyle.  Working with a diet and nutrition specialist (dietitian) can help you make a meal plan that is best for you.  Keep in mind that carbohydrates (carbs) and alcohol have immediate effects on your blood glucose levels. It is important to count carbs and to use alcohol carefully. This information is not intended to replace advice given to you by your health care provider. Make sure you discuss any questions you have with your health care provider. Document Released: 04/14/2005 Document Revised: 02/15/2017 Document Reviewed: 08/22/2016 Elsevier Interactive Patient Education   2019 [employer].

## 2018-08-14 ENCOUNTER — Telehealth: Payer: Self-pay

## 2018-08-14 NOTE — Telephone Encounter (Signed)
Spoke with the patient, he said he felt better since increasing his metoprolol and he has not experienced SVT. However, he stated he was fatigued and said he was going to stop working out. Sending to Dr. Radford Pax.

## 2018-08-16 NOTE — Telephone Encounter (Signed)
Please verify dose of Toprol patient is taking

## 2018-08-17 NOTE — Telephone Encounter (Signed)
Please find out if he would like to try bystolic instead of Toprol which has less side effects than toprol

## 2018-08-17 NOTE — Telephone Encounter (Signed)
He stated he would like to try Bystolic.

## 2018-08-17 NOTE — Telephone Encounter (Signed)
He is taking 50 mg, BID.

## 2018-08-18 NOTE — Telephone Encounter (Signed)
Stop Toprol and start Bystolic 5mg  daily.  Have him followup with extender in 2 weeks to see how he is doing

## 2018-08-20 MED ORDER — NEBIVOLOL HCL 5 MG PO TABS: 5.0000 mg | ORAL_TABLET | Freq: Every day | ORAL | 11 refills | Status: DC

## 2018-08-20 NOTE — Telephone Encounter (Signed)
Spoke with the patient, he accepted the medication change and stopped Toprol. He is seeing [place] on 2/7 for the 2 week follow up.

## 2018-08-21 ENCOUNTER — Encounter: Payer: Self-pay | Admitting: Cardiology

## 2018-09-07 ENCOUNTER — Ambulatory Visit (INDEPENDENT_AMBULATORY_CARE_PROVIDER_SITE_OTHER): Payer: PPO | Admitting: Cardiology

## 2018-09-07 ENCOUNTER — Encounter: Payer: Self-pay | Admitting: Cardiology

## 2018-09-07 ENCOUNTER — Telehealth: Payer: Self-pay | Admitting: Cardiology

## 2018-09-07 VITALS — BP 128/76 | HR 59 | Ht 64.5 in | Wt 186.0 lb

## 2018-09-07 DIAGNOSIS — I471 Supraventricular tachycardia: Secondary | ICD-10-CM | POA: Diagnosis not present

## 2018-09-07 DIAGNOSIS — I472 Ventricular tachycardia, unspecified: Secondary | ICD-10-CM

## 2018-09-07 DIAGNOSIS — I208 Other forms of angina pectoris: Secondary | ICD-10-CM

## 2018-09-07 DIAGNOSIS — R000 Tachycardia, unspecified: Secondary | ICD-10-CM

## 2018-09-07 DIAGNOSIS — R079 Chest pain, unspecified: Secondary | ICD-10-CM | POA: Insufficient documentation

## 2018-09-07 LAB — BASIC METABOLIC PANEL
BUN/Creatinine Ratio: 14 (ref 10–24)
BUN: 13 mg/dL (ref 8–27)
CALCIUM: 10.2 mg/dL (ref 8.6–10.2)
CO2: 24 mmol/L (ref 20–29)
CREATININE: 0.9 mg/dL (ref 0.76–1.27)
Chloride: 99 mmol/L (ref 96–106)
GFR calc Af Amer: 102 mL/min/{1.73_m2} (ref >59)
GFR, EST NON AFRICAN AMERICAN: 88 mL/min/{1.73_m2} (ref >59)
GLUCOSE: 207 mg/dL — AB (ref 65–99)
Potassium: 4.6 mmol/L (ref 3.5–5.2)
Sodium: 138 mmol/L (ref 134–144)

## 2018-09-07 MED ORDER — PANTOPRAZOLE SODIUM 40 MG PO TBEC: 40.0000 mg | DELAYED_RELEASE_TABLET | Freq: Every day | ORAL | 3 refills | Status: DC

## 2018-09-07 MED ORDER — NEBIVOLOL HCL 5 MG PO TABS: 5.0000 mg | ORAL_TABLET | Freq: Every day | ORAL | 3 refills | Status: DC

## 2018-09-07 NOTE — Progress Notes (Signed)
Cardiology Office Note:    Date:  09/07/2018   ID:  Khaiden, Segreto 1950/10/21, MRN [id number]  PCP:  Horald Pollen, MD  Cardiologist:  Fransico Him, MD    Referring MD: Horald Pollen, *   Chief Complaint  Patient presents with  . Follow-up    PVCs, WCT    History of Present Illness:    Kenneth Velez is a 68 y.o. male with a hx of who saw me on 06/04/2018 for exertional chest pain radiating to his left arm as well as dyspnea on exertion and palpitations.  No family history of CAD and he has never smoked.  EKG was without acute change.  2D echo normal LVEF 60 to 65% with mild LVH and grade 1 DD, nuclear stress test normal, 24-hour monitor showed sinus bradycardia, normal sinus rhythm and sinus tachycardia average heart rate 61 bpm range 41 to 231 bpm and SVT up to 7 beats at a time, frequent PACs and wide-complex tachycardia up to 11 beats with bigeminal PVCs with  PVC load less than 1%. He was placed on Toprol XL 50 mg in the morning 25mg  in the afternoon.   He is here today for followup and is doing well.  He denies any chest pain or pressure, SOB, DOE, PND, orthopnea, LE edema, dizziness, palpitations or syncope. He is compliant with his meds and is tolerating meds with no SE.    Past Medical History:  Diagnosis Date  . Arthritis   . Malignant neoplasm of prostate ([place]) 04/20/2015  . Osteoarthritis of left hip 09/21/2012  . Prostate cancer [facility])     Past Surgical History:  Procedure Laterality Date  . JOINT REPLACEMENT    . PROSTATE BIOPSY    . 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 TOTAL HIP ARTHROPLASTY Left 09/21/2012   Procedure: TOTAL HIP ARTHROPLASTY;  Surgeon: Yvette Rack., MD;  Location: [place];  Service: Orthopedics;  Laterality: Left;    Current Medications: No outpatient medications have been marked as taking for the 09/07/18 encounter (Office Visit) with Sueanne Margarita, MD.     Allergies:   Patient has no known  allergies.   Social History   Socioeconomic History  . Marital status: Married    Spouse name: Not on file  . Number of children: Not on file  . Years of education: Not on file  . Highest education level: Not on file  Occupational History  . Not on file  Social Needs  . Financial resource strain: Not on file  . Food insecurity:    Worry: Not on file    Inability: Not on file  . Transportation needs:    Medical: Not on file    Non-medical: Not on file  Tobacco Use  . Smoking status: Never Smoker  . Smokeless tobacco: Never Used  Substance and Sexual Activity  . Alcohol use: No    Comment: occasional  . Drug use: No  . Sexual activity: Yes  Lifestyle  . Physical activity:    Days per week: Not on file    Minutes per session: Not on file  . Stress: Not on file  Relationships  . Social connections:    Talks on phone: Not on file    Gets together: Not on file    Attends religious service: Not on file    Active member of club or organization: Not on file    Attends meetings of clubs  or organizations: Not on file    Relationship status: Not on file  Other Topics Concern  . Not on file  Social History Narrative  . Not on file     Family History: The patient's family history includes Colon cancer in his father; Diabetes in his mother. There is no history of Pancreatic cancer, Rectal cancer, Stomach cancer, Esophageal cancer, or Cancer.  ROS:   Please see the history of present illness.    ROS  All other systems reviewed and negative.   EKGs/Labs/Other Studies Reviewed:    The following studies were reviewed today: none  EKG:  EKG is not ordered today.    Recent Labs: 05/25/2018: ALT 21; BUN 16; Creatinine, Ser 0.99; Hemoglobin 13.8; Platelets 341; Potassium 4.7; Sodium 142   Recent Lipid Panel    Component Value Date/Time   CHOL 201 (H) 11/01/2016 0910   TRIG 138 11/01/2016 0910   HDL 50 11/01/2016 0910   CHOLHDL 4.0 11/01/2016 0910   CHOLHDL 4.6  09/29/2013 1044   VLDL 29 09/29/2013 1044   LDLCALC 123 (H) 11/01/2016 0910    Physical Exam:    VS:  BP 128/76   Pulse (!) 59   Ht 5' 4.5" (1.638 m)   Wt 186 lb (84.4 kg)   SpO2 97%   BMI 31.43 kg/m     Wt Readings from Last 3 Encounters:  09/07/18 186 lb (84.4 kg)  07/18/18 186 lb (84.4 kg)  06/14/18 184 lb (83.5 kg)     GEN:  Well nourished, well developed in no acute distress HEENT: Normal NECK: No JVD; No carotid bruits LYMPHATICS: No lymphadenopathy CARDIAC: RRR, no murmurs, rubs, gallops RESPIRATORY:  Clear to auscultation without rales, wheezing or rhonchi  ABDOMEN: Soft, non-tender, non-distended MUSCULOSKELETAL:  No edema; No deformity  SKIN: Warm and dry NEUROLOGIC:  Alert and oriented x 3 PSYCHIATRIC:  Normal affect   ASSESSMENT:    1. SVT (supraventricular tachycardia) ([place])   2. Exertional angina (HCC)   3. Wide-complex tachycardia ([place])    PLAN:    In order of problems listed above:  1.  SVT -he continues to have episodic palpitations.  Some days he has none and other days he has more.  Resting bradycardia so were unable to increase his beta-blocker.  He will continue on Bystolic 5 mg daily.  2.  HTN - BP is well controlled on exam today.  Will continue on Bystolic 5 mg daily.  3.  Wide complex tachycardia - noted on event monitor up to 11 beats with bigeminal PVCs.  2D echocardiogram showed mildly thickened heart muscle with normal LV function.  Stress test showed no ischemia.  He will continue on Bystolic 5 mg daily.  I have recommended getting a cardiac MRI to rule out RV dysplasia or ventricular arrhythmias.  Another thing to consider would be amyloidosis but he has not had any heart failure.  He does have mild LVH on echo but only 11 mm in thickness.  We will be able to diagnose amyloidosis as well by cardiac MRI when we are assessing his RV.  4.  Chest pain -he describes 2 different types of chest pain.  The first type of chest pain occurs when he  is lying down and he moves from one side to the other and he has sharp pain across his chest.  That is consistent with musculoskeletal origin.  The other pain is a constant chronic discomfort in the midsternal area of his chest that starts  in his epigastric area and moves up.  He says he has a lot of problems with stomach issues.  He has not on a PPI.  I am going to start him on Protonix 40 mg daily and see him back in 4 weeks.  If his symptoms have not improved on PPI therapy then would consider getting coronary CTA to further find coronary anatomy.  His nuclear stress test showed no ischemia.   Medication Adjustments/Labs and Tests Ordered: Current medicines are reviewed at length with the patient today.  Concerns regarding medicines are outlined above.  No orders of the defined types were placed in this encounter.  No orders of the defined types were placed in this encounter.   Signed, Fransico Him, MD  09/07/2018 11:12 AM    [place]

## 2018-09-07 NOTE — Telephone Encounter (Signed)
Spoke Dr. French Ana office asked if the patient was safe for a Cardiac MRI due to his hip replacement. Our office received a call that it was safe to continue per [place].

## 2018-09-07 NOTE — Addendum Note (Signed)
Addended by: Sarina Ill on: 09/07/2018 11:31 AM   Modules accepted: Orders

## 2018-09-07 NOTE — Patient Instructions (Signed)
Medication Instructions:  Start: Protonix 40 mg, daily, by mouth  If you need a refill on your cardiac medications before your next appointment, please call your pharmacy.   Lab work: Today: BMET  If you have labs (blood work) drawn today and your tests are completely normal, you will receive your results only by: [name] Kitchen MyChart Message (if you have MyChart) OR . A paper copy in the mail If you have any lab test that is abnormal or we need to change your treatment, we will call you to review the results.  Testing/Procedures: Your physician has requested that you have a cardiac MRI. Cardiac MRI uses a computer to create images of your heart as its beating, producing both still and moving pictures of your heart and major blood vessels. For further information please visit [email or web address] Please follow the instruction sheet given to you today for more information.  Follow-Up:Your physician recommends that you schedule a follow-up appointment in: 4 weeks with Dr. Radford Pax or Care Team.

## 2018-09-07 NOTE — Telephone Encounter (Signed)
New Message   Sunday Spillers left Messge for [place] stating it's fine for patient to have MRI.

## 2018-09-10 DIAGNOSIS — C61 Malignant neoplasm of prostate: Secondary | ICD-10-CM | POA: Diagnosis not present

## 2018-09-13 ENCOUNTER — Encounter: Payer: Self-pay | Admitting: Cardiology

## 2018-09-18 DIAGNOSIS — Z8546 Personal history of malignant neoplasm of prostate: Secondary | ICD-10-CM | POA: Diagnosis not present

## 2018-09-18 DIAGNOSIS — N5201 Erectile dysfunction due to arterial insufficiency: Secondary | ICD-10-CM | POA: Diagnosis not present

## 2018-09-28 ENCOUNTER — Telehealth (HOSPITAL_COMMUNITY): Payer: Self-pay | Admitting: Emergency Medicine

## 2018-09-28 NOTE — Telephone Encounter (Signed)
Left message on voicemail with name and callback number Gertrude Bucks RN Navigator Cardiac Imaging [facility] [phone] Office [phone] Cell  

## 2018-10-01 ENCOUNTER — Ambulatory Visit (HOSPITAL_COMMUNITY)
Admission: RE | Admit: 2018-10-01 | Discharge: 2018-10-01 | Disposition: A | Discharge status: Home / Self Care | Payer: PPO | Source: Ambulatory Visit | Attending: Cardiology | Admitting: Cardiology

## 2018-10-01 DIAGNOSIS — I472 Ventricular tachycardia, unspecified: Secondary | ICD-10-CM

## 2018-10-01 MED ORDER — GADOBUTROL 1 MMOL/ML IV SOLN
10.0000 mL | Freq: Once | INTRAVENOUS | Status: AC | PRN
  Administered 2018-10-01: 10 mL via INTRAVENOUS

## 2018-10-02 ENCOUNTER — Telehealth: Payer: Self-pay

## 2018-10-02 NOTE — Telephone Encounter (Signed)
LMTCB... seeing Estella Husk PA  10/03/18

## 2018-10-02 NOTE — Telephone Encounter (Signed)
-----   Message from Sueanne Margarita, MD sent at 10/01/2018  5:35 PM EST ----- Cardiac MRI showed normal LVF with mildly enlarged atria, trivial leakiness of MV

## 2018-10-02 NOTE — Progress Notes (Signed)
Thank you, Traci

## 2018-10-03 ENCOUNTER — Ambulatory Visit (INDEPENDENT_AMBULATORY_CARE_PROVIDER_SITE_OTHER): Payer: PPO | Admitting: Physician Assistant

## 2018-10-03 ENCOUNTER — Encounter: Payer: Self-pay | Admitting: Physician Assistant

## 2018-10-03 VITALS — BP 138/76 | HR 63 | Ht 64.5 in | Wt 190.2 lb

## 2018-10-03 DIAGNOSIS — I472 Ventricular tachycardia: Secondary | ICD-10-CM | POA: Diagnosis not present

## 2018-10-03 DIAGNOSIS — R0609 Other forms of dyspnea: Secondary | ICD-10-CM | POA: Diagnosis not present

## 2018-10-03 DIAGNOSIS — I471 Supraventricular tachycardia, unspecified: Secondary | ICD-10-CM

## 2018-10-03 DIAGNOSIS — R0602 Shortness of breath: Secondary | ICD-10-CM

## 2018-10-03 DIAGNOSIS — R079 Chest pain, unspecified: Secondary | ICD-10-CM

## 2018-10-03 DIAGNOSIS — R000 Tachycardia, unspecified: Secondary | ICD-10-CM

## 2018-10-03 NOTE — Progress Notes (Signed)
Cardiology Office Note    Date:  10/03/2018   ID:  Kenneth Velez, Kenneth Velez [birth year], MRN [id number]  PCP:  Kenneth Pollen, MD  Cardiologist: Kenneth Him, MD EPS: None  Chief Complaint  Patient presents with  . Follow-up    History of Present Illness:  Kenneth Velez is a 68 y.o. male with history of SVT 7 beats at a time in wide-complex tachycardia 11 beats with bigeminal PVCs and PVC load less than 1% on monitor treated with beta-blocker.  2D echo normal LVEF 60 to 65% with mild LVH and grade 1 DD, normal nuclear stress test.  Patient saw Dr. Radford Velez 09/07/2018 and recommended MRI to rule out RV dysplasia or ventricular arrhythmias.  He was also having 2 types of chest pain one felt to be musculoskeletal the other GI.  She placed Velez on a PPI and if not improved consider CTA.  Patient comes in for f/u. Says he feels a whistling sound when he sleeps and snoring. Dyspnea on exertion. Never smoked. 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  He is worried there is something wrong with his lungs and is asking about asthma and upper respiratory problems.   Past Medical History:  Diagnosis Date  . Arthritis   . Malignant neoplasm of prostate ([place]) 04/20/2015  . Osteoarthritis of left hip 09/21/2012  . Prostate cancer [facility])     Past Surgical History:  Procedure Laterality Date  . JOINT REPLACEMENT    . PROSTATE BIOPSY    . 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 TOTAL HIP ARTHROPLASTY Left 09/21/2012   Procedure: TOTAL HIP ARTHROPLASTY;  Surgeon: Yvette Rack., MD;  Location: [facility];  Service: Orthopedics;  Laterality: Left;    Current Medications: Current Meds  Medication Sig  . nebivolol (BYSTOLIC) 5 MG tablet Take 1 tablet (5 mg total) by mouth daily.  . pantoprazole (PROTONIX) 40 MG tablet Take 1 tablet (40 mg total) by mouth daily.     Allergies:   Patient has no known allergies.   Social History    Socioeconomic History  . Marital status: Married    Spouse name: Not on file  . Number of children: Not on file  . Years of education: Not on file  . Highest education level: Not on file  Occupational History  . Not on file  Social Needs  . Financial resource strain: Not on file  . Food insecurity:    Worry: Not on file    Inability: Not on file  . Transportation needs:    Medical: Not on file    Non-medical: Not on file  Tobacco Use  . Smoking status: Never Smoker  . Smokeless tobacco: Never Used  Substance and Sexual Activity  . Alcohol use: No    Comment: occasional  . Drug use: No  . Sexual activity: Yes  Lifestyle  . Physical activity:    Days per week: Not on file    Minutes per session: Not on file  . Stress: Not on file  Relationships  . Social connections:    Talks on phone: Not on file    Gets together: Not on file    Attends religious service: Not on file    Active member of club or organization: Not on file    Attends meetings of clubs or organizations: Not on file    Relationship status: Not on file  Other Topics Concern  . Not  on file  Social History Narrative  . Not on file     Family History:  The patient's family history includes Colon cancer in his father; Diabetes in his mother.   ROS:   Please see the history of present illness.    Review of Systems  Constitution: Negative.  HENT: Negative.   Cardiovascular: Positive for dyspnea on exertion.  Respiratory: Positive for sleep disturbances due to breathing, snoring and wheezing.   Endocrine: Negative.   Hematologic/Lymphatic: Negative.   Musculoskeletal: Positive for back pain.  Gastrointestinal: Negative.   Genitourinary: Negative.   Neurological: Negative.    All other systems reviewed and are negative.   PHYSICAL EXAM:   VS:  BP 138/76   Pulse 63   Ht 5' 4.5" (1.638 m)   Wt 190 lb 3.2 oz (86.3 kg)   SpO2 96%   BMI 32.14 kg/m   Physical Exam  GEN: Well nourished, well  developed, in no acute distress  Neck: no JVD, carotid bruits, or masses Cardiac:RRR; no murmurs, rubs, or gallops  Respiratory:  clear to auscultation bilaterally, normal work of breathing GI: soft, nontender, nondistended, + BS Ext: without cyanosis, clubbing, or edema, Good distal pulses bilaterally Neuro:  Alert and Oriented x 3 Psych: euthymic mood, full affect  Wt Readings from Last 3 Encounters:  10/03/18 190 lb 3.2 oz (86.3 kg)  09/07/18 186 lb (84.4 kg)  07/18/18 186 lb (84.4 kg)      Studies/Labs Reviewed:   EKG:  EKG is ordered today.  EKG shows normal sinus rhythm, normal EKG  Lipid Panel    Component Value Date/Time   CHOL 201 (H) 11/01/2016 0910   TRIG 138 11/01/2016 0910   HDL 50 11/01/2016 0910   CHOLHDL 4.0 11/01/2016 0910   CHOLHDL 4.6 09/29/2013 1044   VLDL 29 09/29/2013 1044   LDLCALC 123 (H) 11/01/2016 0910    Additional studies/ records that were reviewed today include:  Cardiac MRI 3/2/2020IMPRESSION: 1.  Normal LV size, systolic function, and wall thickness.  EF 68%.   2.  Normal RV size and systolic function, EF 13%.   3. No myocardial LGE, so no definitive evidence for prior MI, infiltrative disease, or myocarditis.   Kenneth Velez     Electronically Signed   By: Loralie Champagne M.D.   On: 10/01/2018 14:33    2D echo 11/2019Study Conclusions   - Left ventricle: The cavity size was normal. Wall thickness was   increased in a pattern of mild LVH. Systolic function was normal.   The estimated ejection fraction was in the range of 60% to 65%.   Wall motion was normal; there were no regional wall motion   abnormalities. Doppler parameters are consistent with abnormal   left ventricular relaxation (grade 1 diastolic dysfunction). The   E/e&' ratio is between 8-15, suggesting indeterminate LV filling   pressure. - Mitral valve: Mildly thickened leaflets . There was trivial   regurgitation. - Left atrium: The atrium was normal in size. -  Tricuspid valve: There was mild regurgitation. - Pulmonary arteries: PA peak pressure: 32 mm Hg (S). - Inferior vena cava: The vessel was normal in size. The   respirophasic diameter changes were in the normal range (>= 50%),   consistent with normal central venous pressure.   Impressions:   - LVEF 60-65%, mild LVH, normal wall motion, grasde 1 DD,   indeterminate LV filling pressure, trivial MR, normal LA size,   mild TR, RVSP 32 mmHg, normal IVC.  Event monitor 11/2019Study Highlights    Sinus bradycardia, normal sinus rhythm and sinus tachycardia. Average heart rate was 61 bpm. The heart rate ranged from 41 to 231 bpm.  SVT up to 7 beats at a time  Frequent PACs  Wide-complex tachycardia up to 11 beats.  Bigeminal PVCs       ASSESSMENT:    1. SVT (supraventricular tachycardia) (HCC)   2. Wide-complex tachycardia (HCC)   3. Chest pain, unspecified type   4. DOE (dyspnea on exertion)   5. SOB (shortness of breath)      PLAN:  In order of problems listed above:  SVT and wide-complex tachycardia controlled on beta-blocker 2D echo with normal LVEF 60 to 65% mild LVH and grade 1 DD.  MRI normal LV function with mildly enlarged atrium trivial.  Minimal palpitations.  History of chest pain with normal nuclear stress test not complaining of chest pain today on Protonix  Dyspnea on exertion as well as snoring and patient says he wheezes at times but is not wheezing today.  Will order sleep apnea test as well as PFTs and refer to pulmonary if indicated.  Chest x-ray from 05/2018 mild bibasilar atelectasis/infiltrates.    Medication Adjustments/Labs and Tests Ordered: Current medicines are reviewed at length with the patient today.  Concerns regarding medicines are outlined above.  Medication changes, Labs and Tests ordered today are listed in the Patient Instructions below. Patient Instructions  Medication Instructions:  Your physician recommends that you continue on  your current medications as directed. Please refer to the Current Medication list given to you today.  If you need a refill on your cardiac medications before your next appointment, please call your pharmacy.   Lab work: None  If you have labs (blood work) drawn today and your tests are completely normal, you will receive your results only by: [name] Kitchen MyChart Message (if you have MyChart) OR . A paper copy in the mail If you have any lab test that is abnormal or we need to change your treatment, we will call you to review the results.  Testing/Procedures: Your physician has recommended that you have a pulmonary function test. Pulmonary Function Tests are a group of tests that measure how well air moves in and out of your lungs.   Your physician has recommended that you have a sleep study. This test records several body functions during sleep, including: brain activity, eye movement, oxygen and carbon dioxide blood levels, heart rate and rhythm, breathing rate and rhythm, the flow of air through your mouth and nose, snoring, body muscle movements, and chest and belly movement. Someone from our sleep department will contact you     Follow-Up: Follow up with Dr. Radford Velez at her next available   Any Other Special Instructions Will Be Listed Below (If Applicable).       Signed, Ermalinda Barrios, PA-C  10/03/2018 2:17 PM    [facility] [address], [place], [place]  [address] Phone: [phone]; Fax: [phone]

## 2018-10-03 NOTE — Patient Instructions (Signed)
Medication Instructions:  Your physician recommends that you continue on your current medications as directed. Please refer to the Current Medication list given to you today.  If you need a refill on your cardiac medications before your next appointment, please call your pharmacy.   Lab work: None  If you have labs (blood work) drawn today and your tests are completely normal, you will receive your results only by: [name] Kitchen MyChart Message (if you have MyChart) OR . A paper copy in the mail If you have any lab test that is abnormal or we need to change your treatment, we will call you to review the results.  Testing/Procedures: Your physician has recommended that you have a pulmonary function test. Pulmonary Function Tests are a group of tests that measure how well air moves in and out of your lungs.   Your physician has recommended that you have a sleep study. This test records several body functions during sleep, including: brain activity, eye movement, oxygen and carbon dioxide blood levels, heart rate and rhythm, breathing rate and rhythm, the flow of air through your mouth and nose, snoring, body muscle movements, and chest and belly movement. Someone from our sleep department will contact you     Follow-Up: Follow up with Dr. Radford Pax at her next available   Any Other Special Instructions Will Be Listed Below (If Applicable).

## 2018-10-08 ENCOUNTER — Ambulatory Visit (INDEPENDENT_AMBULATORY_CARE_PROVIDER_SITE_OTHER): Payer: PPO | Admitting: Internal Medicine

## 2018-10-08 DIAGNOSIS — R0602 Shortness of breath: Secondary | ICD-10-CM | POA: Diagnosis not present

## 2018-10-08 LAB — PULMONARY FUNCTION TEST
DL/VA % pred: 136 %
DL/VA: 5.7 ml/min/mmHg/L
DLCO UNC % PRED: 112 %
DLCO unc: 25.82 ml/min/mmHg
FEF 25-75 PRE: 2.34 L/s
FEF 25-75 Post: 2.66 L/sec
FEF2575-%CHANGE-POST: 13 %
FEF2575-%PRED-POST: 120 %
FEF2575-%Pred-Pre: 106 %
FEV1-%CHANGE-POST: 3 %
FEV1-%Pred-Post: 97 %
FEV1-%Pred-Pre: 93 %
FEV1-POST: 2.38 L
FEV1-Pre: 2.29 L
FEV1FVC-%Change-Post: 2 %
FEV1FVC-%Pred-Pre: 106 %
FEV6-%CHANGE-POST: 1 %
FEV6-%Pred-Post: 92 %
FEV6-%Pred-Pre: 90 %
FEV6-PRE: 2.8 L
FEV6-Post: 2.83 L
FEV6FVC-%Change-Post: 0 %
FEV6FVC-%PRED-PRE: 105 %
FEV6FVC-%Pred-Post: 104 %
FVC-%CHANGE-POST: 1 %
FVC-%Pred-Post: 87 %
FVC-%Pred-Pre: 86 %
FVC-Post: 2.84 L
FVC-Pre: 2.8 L
POST FEV1/FVC RATIO: 84 %
PRE FEV6/FVC RATIO: 100 %
Post FEV6/FVC ratio: 100 %
Pre FEV1/FVC ratio: 82 %
RV % PRED: 87 %
RV: 1.86 L
TLC % pred: 78 %
TLC: 4.81 L

## 2018-10-08 NOTE — Progress Notes (Signed)
PFT done today. 

## 2018-10-09 ENCOUNTER — Other Ambulatory Visit: Payer: Self-pay

## 2018-10-09 DIAGNOSIS — R942 Abnormal results of pulmonary function studies: Secondary | ICD-10-CM

## 2018-10-15 ENCOUNTER — Telehealth: Payer: Self-pay | Admitting: Cardiology

## 2018-10-15 ENCOUNTER — Telehealth: Payer: Self-pay | Admitting: *Deleted

## 2018-10-15 NOTE — Telephone Encounter (Signed)
New Message   Patient just had a Pulmonary function test done and wants to know if there are any other test he should complete.

## 2018-10-15 NOTE — Telephone Encounter (Signed)
Made patient aware that a referral was placed to [facility] pulm and that they would be reaching out to him for an appointment and determine if further testing is needed. Made patient aware that they are working on the pre-cert for his sleep study.Patient verbalized understanding and thanked me for the call.

## 2018-10-15 NOTE — Telephone Encounter (Signed)
-----   Message from Cleon Gustin, RN sent at 10/15/2018 10:33 AM EDT ----- Regarding: Sleep Study Patient needs sleep study per Ermalinda Barrios, PA. Please pre-cert. Thanks

## 2018-10-16 ENCOUNTER — Telehealth: Payer: Self-pay | Admitting: *Deleted

## 2018-10-16 NOTE — Telephone Encounter (Signed)
PA for in lab sleep study  Submitted via web portal to [facility].

## 2018-10-16 NOTE — Telephone Encounter (Signed)
-----   Message from Cleon Gustin, RN sent at 10/15/2018 10:33 AM EDT ----- Regarding: Sleep Study Patient needs sleep study per Ermalinda Barrios, PA. Please pre-cert. Thanks

## 2018-10-17 ENCOUNTER — Encounter: Payer: Self-pay | Admitting: Pulmonary Disease

## 2018-10-17 ENCOUNTER — Other Ambulatory Visit: Payer: Self-pay

## 2018-10-17 ENCOUNTER — Ambulatory Visit (INDEPENDENT_AMBULATORY_CARE_PROVIDER_SITE_OTHER): Payer: PPO | Admitting: Pulmonary Disease

## 2018-10-17 VITALS — BP 140/72 | HR 66 | Ht 64.5 in | Wt 190.6 lb

## 2018-10-17 DIAGNOSIS — R062 Wheezing: Secondary | ICD-10-CM | POA: Diagnosis not present

## 2018-10-17 DIAGNOSIS — Z6832 Body mass index (BMI) 32.0-32.9, adult: Secondary | ICD-10-CM

## 2018-10-17 DIAGNOSIS — R0609 Other forms of dyspnea: Secondary | ICD-10-CM

## 2018-10-17 MED ORDER — BUDESONIDE-FORMOTEROL FUMARATE 160-4.5 MCG/ACT IN AERO: 2.0000 | INHALATION_SPRAY | Freq: Two times a day (BID) | RESPIRATORY_TRACT | 0 refills | Status: DC

## 2018-10-17 MED ORDER — ALBUTEROL SULFATE HFA 108 (90 BASE) MCG/ACT IN AERS: 2.0000 | INHALATION_SPRAY | Freq: Four times a day (QID) | RESPIRATORY_TRACT | 6 refills | Status: DC | PRN

## 2018-10-17 MED ORDER — AEROCHAMBER MV MISC: 0 refills | Status: DC

## 2018-10-17 NOTE — Patient Instructions (Addendum)
Thank you for visiting Dr. Valeta Harms at [facility]. Today we recommend the following:  We will start you on albuterol inhaler as needed for shortness of breath and wheezing. We will also start you on a Symbicort inhaler to be used 2 puffs, twice daily  Return in about 4 weeks (around 11/14/2018).  Follow-up appointment can be scheduled with one of our nurse practitioners.  To review inhaler use and symptomatology.

## 2018-10-17 NOTE — Progress Notes (Signed)
Synopsis: Referred in March 2020 for SOB by Imogene Burn, PA-C  Subjective:   [name]: male DOB: 12/02/1950, MRN: [id number]  Chief Complaint  Patient presents with  . Pulmonary Consult    abnormal PFT    C/o SOB with exertion and activities.  This has been going on for several years however worsened over the past 6 months.  He has been evaluated by cardiology to include myocardial perfusion imaging as well as a cardiac MRI.  He complains of chest pains and when he describes this he reaches both hands to the chest and brings them out from a centralized location to the outside as well as pain radiating to the back.  This has been going on for a while.  He does state that it is predominantly worse when his shortness of breath has been aggravated.  He has noticed an increase in his weight over the past several months.  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  This was a very dusty environment that he was exposed to at this time.  He recently had pulmonary function tests that revealed a mild restriction with a TLC of 78%, he does have normal spirometry and a DLCO of 112%.  He does admit to seasonal allergies with routine sinus symptoms and sneezing frequently in the springtime.   Past Medical History:  Diagnosis Date  . Arthritis   . Malignant neoplasm of prostate ([place]) 04/20/2015  . Osteoarthritis of left hip 09/21/2012  . Prostate cancer [facility])      Family History  Problem Relation Age of Onset  . Diabetes Mother   . Colon cancer Father   . Pancreatic cancer Neg Hx   . Rectal cancer Neg Hx   . Stomach cancer Neg Hx   . Esophageal cancer Neg Hx   . Cancer Neg Hx      Past Surgical History:  Procedure Laterality Date   . JOINT REPLACEMENT    . PROSTATE BIOPSY    . 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 TOTAL HIP ARTHROPLASTY Left 09/21/2012   Procedure: TOTAL HIP ARTHROPLASTY;  Surgeon: Yvette Rack., MD;  Location: [facility];  Service: Orthopedics;  Laterality: Left;    Social History   Socioeconomic History  . Marital status: Married    Spouse name: Not on file  . Number of children: Not on file  . Years of education: Not on file  . Highest education level: Not on file  Occupational History  . Not on file  Social Needs  . Financial resource strain: Not on file  . Food insecurity:    Worry: Not on file    Inability: Not on file  . Transportation needs:    Medical: Not on file    Non-medical: Not on file  Tobacco Use  . Smoking status: Never Smoker  . Smokeless tobacco: Never Used  Substance and Sexual Activity  . Alcohol use: No    Comment: occasional  . Drug use: No  . Sexual activity: Yes  Lifestyle  . Physical activity:    Days per week: Not on file    Minutes per session: Not on file  . Stress: Not on file  Relationships  . Social connections:    Talks on phone: Not on file    Gets together: Not on file    Attends religious service: Not on file    Active member of club or organization: Not on file    Attends meetings of clubs or organizations: Not on file    Relationship status: Not on file  . Intimate partner violence:    Fear of current or ex partner: Not on file    Emotionally abused: Not on file    Physically abused: Not on file    Forced sexual activity: Not on file  Other Topics Concern  . Not on file  Social History Narrative  . Not on file     No Known Allergies   Outpatient Medications Prior to Visit  Medication Sig Dispense Refill  . nebivolol (BYSTOLIC) 5 MG tablet Take 1 tablet (5 mg total) by mouth daily. 90 tablet 3  . pantoprazole (PROTONIX) 40 MG tablet Take 1 tablet (40 mg total) by mouth daily. 90 tablet 3   No  facility-administered medications prior to visit.     Review of Systems  Constitutional: Negative for chills, fever, malaise/fatigue and weight loss.  HENT: Negative for hearing loss, sore throat and tinnitus.   Eyes: Negative for blurred vision and double vision.  Respiratory: Positive for shortness of breath and wheezing. Negative for cough, hemoptysis, sputum production and stridor.   Cardiovascular: Negative for chest pain, palpitations, orthopnea, leg swelling and PND.  Gastrointestinal: Negative for abdominal pain, constipation, diarrhea, heartburn, nausea and vomiting.  Genitourinary: Negative for dysuria, hematuria and urgency.  Musculoskeletal: Negative for joint pain and myalgias.  Skin: Negative for itching and rash.  Neurological: Negative for dizziness, tingling, weakness and headaches.  Endo/Heme/Allergies: Negative for environmental allergies. Does not bruise/bleed easily.  Psychiatric/Behavioral: Negative for depression. The patient is not nervous/anxious and does not have insomnia.   All other systems reviewed and are negative.    Objective:  Physical Exam Vitals signs reviewed.  Constitutional:      General: He is not in acute distress.    Appearance: He is well-developed.  HENT:     Head: Normocephalic and atraumatic.     Comments: Senile arcus Eyes:     General: No scleral icterus.    Conjunctiva/sclera: Conjunctivae normal.     Pupils: Pupils are equal, round, and reactive to light.  Neck:     Musculoskeletal: Neck supple.     Vascular: No JVD.     Trachea: No tracheal deviation.  Cardiovascular:     Rate and Rhythm: Normal rate and regular rhythm.     Heart sounds: Normal heart sounds. No murmur.  Pulmonary:     Effort: Pulmonary effort is normal. No tachypnea, accessory muscle usage or respiratory distress.     Breath sounds: Normal breath sounds. No stridor. No wheezing, rhonchi or rales.  Abdominal:     General: Bowel sounds are normal. There is no  distension.     Palpations: Abdomen is soft.     Tenderness: There is no abdominal tenderness.  Musculoskeletal:        General: No tenderness.  Lymphadenopathy:     Cervical: No cervical adenopathy.  Skin:    General: Skin is warm and dry.     Capillary Refill: Capillary refill takes less than 2 seconds.     Findings: No rash.  Neurological:     Mental Status: He is alert and oriented to person, place, and time.  Psychiatric:        Behavior: Behavior normal.      Vitals:   10/17/18 1600  BP: 140/72  Pulse: 66  SpO2: 97%  Weight: 190 lb 9.6 oz (86.5 kg)  Height: 5' 4.5" (1.638 m)   97% on RA BMI Readings from Last 3 Encounters:  10/17/18 32.21 kg/m  10/03/18 32.14 kg/m  09/07/18 31.43 kg/m   Wt Readings from Last 3 Encounters:  10/17/18 190 lb 9.6 oz (86.5 kg)  10/03/18 190 lb 3.2 oz (86.3 kg)  09/07/18 186 lb (84.4 kg)     CBC    Component Value Date/Time   WBC 5.3 05/25/2018 1508   WBC 6.9 03/26/2014 1115   WBC 10.3 09/24/2012 0700   RBC 4.72 05/25/2018 1508   RBC 5.07 03/26/2014 1115   RBC 3.47 (L) 09/24/2012 0700   HGB 13.8 05/25/2018 1508   HCT 42.5 05/25/2018 1508   PLT 341 05/25/2018 1508   MCV 90 05/25/2018 1508   MCH 29.2 05/25/2018 1508   MCH 29.4 03/26/2014 1115   MCH 29.7 09/24/2012 0700   MCHC 32.5 05/25/2018 1508   MCHC 32.4 03/26/2014 1115   MCHC 34.2 09/24/2012 0700   RDW 12.5 05/25/2018 1508   LYMPHSABS 1.5 05/25/2018 1508   MONOABS 0.5 09/14/2012 1226   EOSABS 0.1 05/25/2018 1508   BASOSABS 0.0 05/25/2018 1508     Chest Imaging: Chest x-ray 05/25/2018: Bibasilar atelectasis infiltrates. The patient's images have been independently reviewed by me.   Pulmonary Functions Testing Results: PFT Results Latest Ref Rng & Units 10/08/2018  FVC-Pre L 2.80  FVC-Predicted Pre % 86  FVC-Post L 2.84  FVC-Predicted Post % 87  Pre FEV1/FVC % % 82  Post FEV1/FCV % % 84  FEV1-Pre L 2.29  FEV1-Predicted Pre % 93  FEV1-Post L 2.38   DLCO UNC% % 112  DLCO COR %Predicted % 136  TLC L 4.81  TLC % Predicted % 78  RV % Predicted % 87     FeNO: None   Pathology: None   Echocardiogram:   Nuclear stress EF: 63%.  There was no ST segment deviation noted during stress.  This is a low risk study.  The study is normal.  Heart Catheterization: None     Assessment & Plan:   DOE (dyspnea on exertion)  Wheezing  BMI 32.0-32.9,adult  Discussion:  This is a 68 year old gentleman with ongoing history over the past 6 months of worsening dyspnea on exertion with occasional wheezing.  He does have some admitted allergies with occasional sneezing and rhinitis and sinus symptoms.  We will start treatment with Symbicort to see if he has any improvement.  Will be given a albuterol inhaler as well for shortness of breath and wheezing.  I am unsure of his diagnosis at this time.  He has had a significant cardiac work-up.  He does have a mildly elevated DLCO of 112%.  This can be seen sometimes in patients with asthma.  If we treat him empirically and he improves of symptoms I do not think you need further evaluation and we could treat him as if he has mild intermittent asthma.  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  This type of occupational exposure has been linked to the development of hypersensitivity pneumonitis.  Patient return to clinic in 4 weeks.  To be scheduled with 1 of  our nurse practitioners.  Greater than 50% of this patient's 45-minute office visit was been face-to-face discussing above recommendations and treatment plan as well as reviewing his pulmonary function test that were completed prior to the office.   Current Outpatient Medications:  .  nebivolol (BYSTOLIC) 5 MG tablet, Take 1 tablet (5 mg total) by mouth daily., Disp: 90 tablet, Rfl: 3 .  pantoprazole (PROTONIX) 40 MG  tablet, Take 1 tablet (40 mg total) by mouth daily., Disp: 90 tablet, Rfl: 3   Garner Nash, DO  Pulmonary Critical Care 10/17/2018 4:23 PM

## 2018-10-19 ENCOUNTER — Telehealth: Payer: Self-pay | Admitting: *Deleted

## 2018-10-19 NOTE — Telephone Encounter (Signed)
Received a call from [place] with [facility]  Informing me the in lab sleep study has been denied. Patient can have HST no PA is required. Gae Bon  Notified to inform ordering provider.

## 2018-10-31 ENCOUNTER — Telehealth: Payer: Self-pay | Admitting: *Deleted

## 2018-10-31 DIAGNOSIS — R002 Palpitations: Secondary | ICD-10-CM

## 2018-10-31 DIAGNOSIS — R079 Chest pain, unspecified: Secondary | ICD-10-CM

## 2018-10-31 DIAGNOSIS — R0602 Shortness of breath: Secondary | ICD-10-CM

## 2018-10-31 NOTE — Telephone Encounter (Signed)
RE: Sleep Study  Imogene Burn, PA-C  Freada Bergeron, CMA        Yes you can order HST

## 2018-10-31 NOTE — Telephone Encounter (Signed)
-----   Message from Lauralee Evener, [place] sent at 10/19/2018  3:52 PM EDT ----- Regarding: RE: Sleep Study HTA denied in lab sleep study. States patient can have HST. No PA needed. Please notify ordering provider. ----- Message ----- From: Cleon Gustin, RN Sent: 10/15/2018  10:33 AM EDT To: Freada Bergeron, CMA, [facility] Sleep Studies Subject: Sleep Study                                    Patient needs sleep study per Ermalinda Barrios, PA. Please pre-cert. Thanks

## 2018-11-08 NOTE — Telephone Encounter (Signed)
Home sleep test 

## 2018-11-22 ENCOUNTER — Ambulatory Visit: Payer: PPO | Admitting: Pulmonary Disease

## 2018-11-23 NOTE — Telephone Encounter (Signed)
Informed patient of upcoming home sleep study. Patient is aware of Home Sleep Study through [facility]. Patient is scheduled for 01/07/19 at 11:30 to pick up home sleep kit and meet with Respiratory therapist at [facility]. Patient is aware that if this appointment date and time does not work for them they should contact Artis Delay directly at [phone]. Patient is aware that a sleep packet will be sent from [facility] in week. Left detailed message on voicemail and informed patient to call back to confirm or reschedule.

## 2018-12-10 ENCOUNTER — Encounter: Payer: Self-pay | Admitting: Gastroenterology

## 2018-12-19 ENCOUNTER — Telehealth: Payer: Self-pay | Admitting: Cardiology

## 2018-12-19 NOTE — Telephone Encounter (Signed)
Virtual Visit Pre-Appointment Phone Call  "(Name), I am calling you today to discuss your upcoming appointment. We are currently trying to limit exposure to the virus that causes COVID-19 by seeing patients at home rather than in the office."  1. "What is the BEST phone number to call the day of the visit?" - include this in appointment notes  2. Do you have or have access to (through a family member/friend) a smartphone with video capability that we can use for your visit?" a. If yes - list this number in appt notes as cell (if different from BEST phone #) and list the appointment type as a VIDEO visit in appointment notes b. If no - list the appointment type as a PHONE visit in appointment notes  3. Confirm consent - "In the setting of the current Covid19 crisis, you are scheduled for a (phone or video) visit with your provider on (date) at (time).  Just as we do with many in-office visits, in order for you to participate in this visit, we must obtain consent.  If you'd like, I can send this to your mychart (if signed up) or email for you to review.  Otherwise, I can obtain your verbal consent now.  All virtual visits are billed to your insurance company just like a normal visit would be.  By agreeing to a virtual visit, we'd like you to understand that the technology does not allow for your provider to perform an examination, and thus may limit your provider's ability to fully assess your condition. If your provider identifies any concerns that need to be evaluated in person, we will make arrangements to do so.  Finally, though the technology is pretty good, we cannot assure that it will always work on either your or our end, and in the setting of a video visit, we may have to convert it to a phone-only visit.  In either situation, we cannot ensure that we have a secure connection.  Are you willing to proceed?" STAFF: Did the patient verbally acknowledge consent to telehealth visit? Document  YES/NO here: yes/Video/[email or web address].[email or web address]/smartphone  [phone]/verbal consent 12/19/18/  4. Advise patient to be prepared - "Two hours prior to your appointment, go ahead and check your blood pressure, pulse, oxygen saturation, and your weight (if you have the equipment to check those) and write them all down. When your visit starts, your provider will ask you for this information. If you have an Apple Watch or Kardia device, please plan to have heart rate information ready on the day of your appointment. Please have a pen and paper handy nearby the day of the visit as well."  5. Give patient instructions for MyChart download to smartphone OR Doximity/[email or web address].me as below if video visit (depending on what platform provider is using)  6. Inform patient they will receive a phone call 15 minutes prior to their appointment time (may be from unknown caller ID) so they should be prepared to answer    TELEPHONE CALL NOTE  Kenneth Velez has been deemed a candidate for a follow-up tele-health visit to limit community exposure during the Covid-19 pandemic. I spoke with the patient via phone to ensure availability of phone/video source, confirm preferred email & phone number, and discuss instructions and expectations.  I reminded Graeme Bekele Adcox to be prepared with any vital sign and/or heart rhythm information that could potentially be obtained via home monitoring, at the time of his visit. I reminded Ysmael Bekele Villavicencio to expect  a phone call prior to his visit.  Armando Gang 12/19/2018 11:03 AM   INSTRUCTIONS FOR DOWNLOADING THE MYCHART APP TO SMARTPHONE  - The patient must first make sure to have activated MyChart and know their login information - If Apple, go to [employer] and type in MyChart in the search bar and download the app. If Android, ask patient to go to [employer] and type in [place] in the search bar and download the app. The app is free but as with any other app downloads, their  phone may require them to verify saved payment information or Apple/Android password.  - The patient will need to then log into the app with their MyChart username and password, and select [facility] as their healthcare provider to link the account. When it is time for your visit, go to the MyChart app, find appointments, and click Begin Video Visit. Be sure to Select Allow for your device to access the Microphone and Camera for your visit. You will then be connected, and your provider will be with you shortly.  **If they have any issues connecting, or need assistance please contact MyChart service desk [phone])**  **If using a computer, in order to ensure the best quality for their visit they will need to use either of the following Internet Browsers: [employer], or Google Chrome**  IF USING DOXIMITY or DOXY.ME - The patient will receive a link just prior to their visit by text.     FULL LENGTH CONSENT FOR TELE-HEALTH VISIT   I hereby voluntarily request, consent and authorize [facility] and its employed or contracted physicians, physician assistants, nurse practitioners or other licensed health care professionals (the Practitioner), to provide me with telemedicine health care services (the Services") as deemed necessary by the treating Practitioner. I acknowledge and consent to receive the Services by the Practitioner via telemedicine. I understand that the telemedicine visit will involve communicating with the Practitioner through live audiovisual communication technology and the disclosure of certain medical information by electronic transmission. I acknowledge that I have been given the opportunity to request an in-person assessment or other available alternative prior to the telemedicine visit and am voluntarily participating in the telemedicine visit.  I understand that I have the right to withhold or withdraw my consent to the use of telemedicine in the course of  my care at any time, without affecting my right to future care or treatment, and that the Practitioner or I may terminate the telemedicine visit at any time. I understand that I have the right to inspect all information obtained and/or recorded in the course of the telemedicine visit and may receive copies of available information for a reasonable fee.  I understand that some of the potential risks of receiving the Services via telemedicine include:   Delay or interruption in medical evaluation due to technological equipment failure or disruption;  Information transmitted may not be sufficient (e.g. poor resolution of images) to allow for appropriate medical decision making by the Practitioner; and/or   In rare instances, security protocols could fail, causing a breach of personal health information.  Furthermore, I acknowledge that it is my responsibility to provide information about my medical history, conditions and care that is complete and accurate to the best of my ability. I acknowledge that Practitioner's advice, recommendations, and/or decision may be based on factors not within their control, such as incomplete or inaccurate data provided by me or distortions of diagnostic images or specimens that may  result from electronic transmissions. I understand that the practice of medicine is not an exact science and that Practitioner makes no warranties or guarantees regarding treatment outcomes. I acknowledge that I will receive a copy of this consent concurrently upon execution via email to the email address I last provided but may also request a printed copy by calling the office of [facility].    I understand that my insurance will be billed for this visit.   I have read or had this consent read to me.  I understand the contents of this consent, which adequately explains the benefits and risks of the Services being provided via telemedicine.   I have been provided ample opportunity to ask  questions regarding this consent and the Services and have had my questions answered to my satisfaction.  I give my informed consent for the services to be provided through the use of telemedicine in my medical care  By participating in this telemedicine visit I agree to the above.

## 2018-12-25 ENCOUNTER — Ambulatory Visit: Payer: PPO | Admitting: Cardiology

## 2019-01-07 ENCOUNTER — Ambulatory Visit (HOSPITAL_BASED_OUTPATIENT_CLINIC_OR_DEPARTMENT_OTHER): Payer: PPO | Attending: Physician Assistant | Admitting: Cardiology

## 2019-01-07 DIAGNOSIS — R002 Palpitations: Secondary | ICD-10-CM | POA: Diagnosis not present

## 2019-01-07 DIAGNOSIS — R0602 Shortness of breath: Secondary | ICD-10-CM | POA: Diagnosis not present

## 2019-01-07 DIAGNOSIS — I472 Ventricular tachycardia: Secondary | ICD-10-CM | POA: Diagnosis not present

## 2019-01-07 DIAGNOSIS — I471 Supraventricular tachycardia, unspecified: Secondary | ICD-10-CM

## 2019-01-07 DIAGNOSIS — R079 Chest pain, unspecified: Secondary | ICD-10-CM | POA: Diagnosis not present

## 2019-01-07 DIAGNOSIS — R000 Tachycardia, unspecified: Secondary | ICD-10-CM

## 2019-01-07 DIAGNOSIS — R0683 Snoring: Secondary | ICD-10-CM | POA: Diagnosis not present

## 2019-01-11 ENCOUNTER — Other Ambulatory Visit (HOSPITAL_BASED_OUTPATIENT_CLINIC_OR_DEPARTMENT_OTHER): Payer: Self-pay

## 2019-01-11 DIAGNOSIS — R079 Chest pain, unspecified: Secondary | ICD-10-CM

## 2019-01-11 DIAGNOSIS — R002 Palpitations: Secondary | ICD-10-CM

## 2019-01-11 DIAGNOSIS — R0602 Shortness of breath: Secondary | ICD-10-CM

## 2019-01-11 NOTE — Progress Notes (Signed)
Normal sleep study. After he had his PFT's in March he was supposed to be seen by pulmonary. I don't see any appts. Can you check to make sure referral was done? thanks

## 2019-01-11 NOTE — Procedures (Signed)
     Patient Name: Kenneth Velez, [place] Study Date: 01/07/2019 Gender: Male D.O.B: [birth year] Age (years): 68 Referring Provider: Ermalinda Barrios Height (inches): 61 Interpreting Physician: Fransico Him MD, ABSM Weight (lbs): 190 RPSGT: Jacolyn Reedy BMI: 33 MRN: [id number] Neck Size: 17.0  CLINICAL INFORMATION Sleep Study Type: HST  Indication for sleep study: N/A  Epworth Sleepiness Score: NA  SLEEP STUDY TECHNIQUE A multi-channel overnight portable sleep study was performed. The channels recorded were: nasal airflow, thoracic respiratory movement, and oxygen saturation with a pulse oximetry. Snoring was also monitored.  MEDICATIONS Patient self administered medications include: N/A.  SLEEP ARCHITECTURE Patient was studied for 592.7 minutes. The sleep efficiency was 96.8 % and the patient was supine for 5.2%. The arousal index was 0.0 per hour.  RESPIRATORY PARAMETERS The overall AHI was 2.4 per hour, with a central apnea index of 0.0 per hour.  The oxygen nadir was 93% during sleep.  CARDIAC DATA Mean heart rate during sleep was 51.8 bpm.  IMPRESSIONS No significant obstructive sleep apnea occurred during this study (AHI = 2.4/h). No significant central sleep apnea occurred during this study (CAI = 0.0/h). The patient had minimal or no oxygen desaturation during the study (Min O2 = 93%) Patient snored 45.9% during the sleep  DIAGNOSIS Normal study  RECOMMENDATIONS Avoid alcohol, sedatives and other CNS depressants that may worsen sleep apnea and disrupt normal sleep architecture. Sleep hygiene should be reviewed to assess factors that may improve sleep quality. Weight management and regular exercise should be initiated or continued. Consider in lab PSG if patient symptomatic  [Electronically signed] 01/11/2019 09:25 AM  Fransico Him MD, ABSM Diplomate, American Board of Sleep Medicine

## 2019-01-14 NOTE — Progress Notes (Signed)
Patient saw Dr. Val[facility] Harms on 10/17/18

## 2019-01-17 ENCOUNTER — Telehealth: Payer: Self-pay | Admitting: *Deleted

## 2019-01-17 DIAGNOSIS — I10 Essential (primary) hypertension: Secondary | ICD-10-CM

## 2019-01-17 DIAGNOSIS — R079 Chest pain, unspecified: Secondary | ICD-10-CM

## 2019-01-17 NOTE — Telephone Encounter (Addendum)
Informed patient of sleep study results and patient understanding was verbalized. Patient understands his sleep study showed: No significant obstructive sleep apnea occurred during this study (AHI = 2.4/h).  DIAGNOSIS Normal study Pt is aware of normal results but states he is still having pain in his chest and in his back everday. On a scale 0-10 the pain is 7 or 8. Sometimes radiates to his left arm. Today he has no pain. The pain comes and goes. Patient was encouraged to go to the ER if but he states the pain is not too bad and he really wants to see Dr Radford Pax and if possible before 7/9 appointment.  Please advise

## 2019-01-17 NOTE — Telephone Encounter (Signed)
Please order a coronary CTA with morphology and FFR.  If no obstructive disease then this is noncardiac pain and needs to see PCP

## 2019-01-21 NOTE — Telephone Encounter (Signed)
Left a message to call back.

## 2019-01-24 MED ORDER — METOPROLOL TARTRATE 100 MG PO TABS: ORAL_TABLET | ORAL | 0 refills | Status: DC

## 2019-01-24 NOTE — Addendum Note (Signed)
Addended by: Sarina Ill on: 01/24/2019 10:06 AM   Modules accepted: Orders

## 2019-01-24 NOTE — Telephone Encounter (Signed)
Spoke with the patient, he expressed understanding about the cardiac CT. Instructions to be mailed to the patient.

## 2019-02-07 ENCOUNTER — Other Ambulatory Visit: Payer: PPO | Admitting: *Deleted

## 2019-02-07 ENCOUNTER — Telehealth: Payer: PPO | Admitting: Cardiology

## 2019-02-07 ENCOUNTER — Other Ambulatory Visit: Payer: Self-pay

## 2019-02-07 DIAGNOSIS — I10 Essential (primary) hypertension: Secondary | ICD-10-CM | POA: Diagnosis not present

## 2019-02-07 LAB — BASIC METABOLIC PANEL
BUN/Creatinine Ratio: 13 (ref 10–24)
BUN: 12 mg/dL (ref 8–27)
CO2: 23 mmol/L (ref 20–29)
Calcium: 9.6 mg/dL (ref 8.6–10.2)
Chloride: 97 mmol/L (ref 96–106)
Creatinine, Ser: 0.91 mg/dL (ref 0.76–1.27)
GFR calc Af Amer: 100 mL/min/{1.73_m2} (ref >59)
GFR calc non Af Amer: 87 mL/min/{1.73_m2} (ref >59)
Glucose: 192 mg/dL — ABNORMAL HIGH (ref 65–99)
Potassium: 4.5 mmol/L (ref 3.5–5.2)
Sodium: 135 mmol/L (ref 134–144)

## 2019-02-08 ENCOUNTER — Ambulatory Visit (HOSPITAL_COMMUNITY): Payer: PPO

## 2019-02-08 ENCOUNTER — Ambulatory Visit (HOSPITAL_COMMUNITY)
Admission: RE | Admit: 2019-02-08 | Discharge: 2019-02-08 | Disposition: A | Discharge status: Home / Self Care | Payer: PPO | Source: Ambulatory Visit | Attending: Cardiology | Admitting: Cardiology

## 2019-02-08 ENCOUNTER — Telehealth: Payer: Self-pay | Admitting: Cardiology

## 2019-02-08 DIAGNOSIS — I700 Atherosclerosis of aorta: Secondary | ICD-10-CM | POA: Insufficient documentation

## 2019-02-08 DIAGNOSIS — R079 Chest pain, unspecified: Secondary | ICD-10-CM

## 2019-02-08 MED ORDER — NITROGLYCERIN 0.4 MG SL SUBL
0.8000 mg | SUBLINGUAL_TABLET | Freq: Once | SUBLINGUAL | Status: AC
  Administered 2019-02-08: 14:00:00 0.8 mg via SUBLINGUAL
  Filled 2019-02-08: qty 25

## 2019-02-08 MED ORDER — IOHEXOL 350 MG/ML SOLN
80.0000 mL | Freq: Once | INTRAVENOUS | Status: AC | PRN
  Administered 2019-02-08: 14:00:00 80 mL via INTRAVENOUS

## 2019-02-08 MED ORDER — NITROGLYCERIN 0.4 MG SL SUBL
SUBLINGUAL_TABLET | SUBLINGUAL | Status: AC
  Filled 2019-02-08: qty 2

## 2019-02-08 NOTE — Telephone Encounter (Signed)
Patient called the office returning a call he received to discuss lab results.  The patient is scheduled for two different CT Scans at 1:30 and 2:00 today and will be unable to take the call until after the tests are completed.

## 2019-02-08 NOTE — Telephone Encounter (Signed)
Pt has been notified of lab results. Pt asked about his glucose level. I stated it was 192. He what should he do. I advised him to f/u w/PCP for further recommendations pertaining to his glucose level. Pt thanked me for the call. The patient has been notified of the result and verbalized understanding.  All questions (if any) were answered. Julaine Hua, [facility] 02/08/2019 4:56 PM

## 2019-02-12 ENCOUNTER — Other Ambulatory Visit: Payer: Self-pay

## 2019-02-12 ENCOUNTER — Ambulatory Visit (HOSPITAL_COMMUNITY)
Admission: RE | Admit: 2019-02-12 | Discharge: 2019-02-12 | Disposition: A | Discharge status: Home / Self Care | Payer: PPO | Source: Ambulatory Visit | Attending: Cardiology | Admitting: Cardiology

## 2019-02-12 DIAGNOSIS — I700 Atherosclerosis of aorta: Secondary | ICD-10-CM | POA: Insufficient documentation

## 2019-02-12 DIAGNOSIS — R079 Chest pain, unspecified: Secondary | ICD-10-CM | POA: Insufficient documentation

## 2019-02-13 DIAGNOSIS — I700 Atherosclerosis of aorta: Secondary | ICD-10-CM | POA: Diagnosis not present

## 2019-02-19 ENCOUNTER — Other Ambulatory Visit: Payer: Self-pay | Admitting: *Deleted

## 2019-02-19 DIAGNOSIS — I208 Other forms of angina pectoris: Secondary | ICD-10-CM

## 2019-02-19 DIAGNOSIS — I25118 Atherosclerotic heart disease of native coronary artery with other forms of angina pectoris: Secondary | ICD-10-CM

## 2019-02-21 ENCOUNTER — Other Ambulatory Visit: Payer: PPO | Admitting: *Deleted

## 2019-02-21 ENCOUNTER — Other Ambulatory Visit: Payer: Self-pay

## 2019-02-21 DIAGNOSIS — I208 Other forms of angina pectoris: Secondary | ICD-10-CM

## 2019-02-21 LAB — LIPID PANEL
Chol/HDL Ratio: 5.2 ratio — ABNORMAL HIGH (ref 0.0–5.0)
Cholesterol, Total: 224 mg/dL — ABNORMAL HIGH (ref 100–199)
HDL: 43 mg/dL (ref >39)
LDL Calculated: 157 mg/dL — ABNORMAL HIGH (ref 0–99)
Triglycerides: 121 mg/dL (ref 0–149)
VLDL Cholesterol Cal: 24 mg/dL (ref 5–40)

## 2019-02-21 LAB — ALT: ALT: 22 IU/L (ref 0–44)

## 2019-02-22 ENCOUNTER — Telehealth: Payer: Self-pay

## 2019-02-22 DIAGNOSIS — E785 Hyperlipidemia, unspecified: Secondary | ICD-10-CM

## 2019-02-22 MED ORDER — ATORVASTATIN CALCIUM 40 MG PO TABS: 40.0000 mg | ORAL_TABLET | Freq: Every day | ORAL | 3 refills | Status: DC

## 2019-02-22 NOTE — Telephone Encounter (Signed)
Patient is returning call.  °

## 2019-02-22 NOTE — Telephone Encounter (Signed)
I spoke to the patient with Dr Theodosia Blender recommendations.  He verbalized understanding and will call us next week if CP persists.

## 2019-02-22 NOTE — Telephone Encounter (Signed)
Notes recorded by Frederik Schmidt, RN on 02/22/2019 at 10:40 AM EDT  lpmtcb 7/24  ------

## 2019-02-22 NOTE — Telephone Encounter (Signed)
-----   Message from Sueanne Margarita, MD sent at 02/22/2019 10:33 AM EDT ----- LDL too high - start Lipitor 40mg  daily and repeat FLP and ALT in 6 weeks

## 2019-02-25 ENCOUNTER — Telehealth (INDEPENDENT_AMBULATORY_CARE_PROVIDER_SITE_OTHER): Payer: PPO | Admitting: Cardiology

## 2019-02-25 ENCOUNTER — Encounter: Payer: Self-pay | Admitting: Cardiology

## 2019-02-25 ENCOUNTER — Other Ambulatory Visit: Payer: Self-pay

## 2019-02-25 VITALS — Ht 64.0 in | Wt 184.0 lb

## 2019-02-25 DIAGNOSIS — I2583 Coronary atherosclerosis due to lipid rich plaque: Secondary | ICD-10-CM | POA: Diagnosis not present

## 2019-02-25 DIAGNOSIS — I10 Essential (primary) hypertension: Secondary | ICD-10-CM

## 2019-02-25 DIAGNOSIS — I2510 Atherosclerotic heart disease of native coronary artery without angina pectoris: Secondary | ICD-10-CM | POA: Diagnosis not present

## 2019-02-25 DIAGNOSIS — R079 Chest pain, unspecified: Secondary | ICD-10-CM | POA: Diagnosis not present

## 2019-02-25 DIAGNOSIS — I471 Supraventricular tachycardia: Secondary | ICD-10-CM

## 2019-02-25 DIAGNOSIS — E7800 Pure hypercholesterolemia, unspecified: Secondary | ICD-10-CM

## 2019-02-25 DIAGNOSIS — R062 Wheezing: Secondary | ICD-10-CM

## 2019-02-25 MED ORDER — ASPIRIN EC 81 MG PO TBEC: 81.0000 mg | DELAYED_RELEASE_TABLET | Freq: Every day | ORAL | 3 refills | Status: AC

## 2019-02-25 NOTE — Progress Notes (Signed)
Virtual Visit via Telephone Note   This visit type was conducted due to national recommendations for restrictions regarding the COVID-19 Pandemic (e.g. social distancing) in an effort to limit this patient's exposure and mitigate transmission in our community.  Due to his co-morbid illnesses, this patient is at least at moderate risk for complications without adequate follow up.  This format is felt to be most appropriate for this patient at this time.  All issues noted in this document were discussed and addressed.  A limited physical exam was performed with this format.  Please refer to the patient's chart for his consent to telehealth for [facility].   Evaluation Performed:  Follow-up visit  This visit type was conducted due to national recommendations for restrictions regarding the COVID-19 Pandemic (e.g. social distancing).  This format is felt to be most appropriate for this patient at this time.  All issues noted in this document were discussed and addressed.  No physical exam was performed (except for noted visual exam findings with Video Visits).  Please refer to the patient's chart (MyChart message for video visits and phone note for telephone visits) for the patient's consent to telehealth for [facility]  Date:  02/25/2019   ID:  Kenneth, Velez 12-27-1950, MRN [id number]  Patient Location:  Home  Provider location:   [place]  PCP:  Horald Pollen, MD  Cardiologist:  Fransico Him, MD  Electrophysiologist:  None   Chief Complaint:  PVCs, WCT  History of Present Illness:    Kenneth Velez is a 68 y.o. male who presents via audio/video conferencing for a telehealth visit today.   Kenneth Velez is a 68 y.o. male who saw me on 06/04/2018 for exertional chest pain radiating to his left arm as well as dyspnea on exertion and palpitations. No family history of CAD and he has never smoked. EKG was without acute change. 2D echo normal LVEF 60 to  65% with mild LVH and grade 1 DD, nuclear stress test normal, 24-hour monitor showed sinus bradycardia, normal sinus rhythm and sinus tachycardia average heart rate 61 bpm range 41 to 231 bpm and SVT up to 7 beats at a time, frequent PACs and wide-complex tachycardia up to 11 beats with bigeminal PVCs with PVC load less than 1%.He was placed on Toprol XL 50 mg in the morning 25mg  in the afternoon. Coronary CTA showed mild CAD in the prox to mid RCA 25-49% and minimal calcified plaque in the ostial and mid LAD and prox LCX.  Calcium score was 23.    He is here today for followup and is doing well.  He continues to have sharp stabbing pain in his chest and back that occurs mainly when laying down at night and says that it is getting worse. He says it is better to sleep on his back than on his sides as the pain get worse on his sides.  He has no associated sx of diaphoresis, N or SOB with the pain.  The pain is worse with movement and with breathing. He denies any  pressure, SOB, DOE, PND, orthopnea, LE edema, dizziness, palpitations or syncope. He is compliant with his meds and is tolerating meds with no SE.    The patient does not have symptoms concerning for COVID-19 infection (fever, chills, cough, or new shortness of breath).    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   nebivolol (BYSTOLIC) 5 MG tablet Take 1 tablet (5 mg total) by mouth daily.     Allergies:   Patient has no known allergies.   Social History   Tobacco Use   Smoking status: Never Smoker   Smokeless tobacco: Never Used  Substance Use Topics   Alcohol use: No    Comment: occasional   Drug use: No     Family Hx: The patient's family history includes Colon cancer in his father; Diabetes in his mother. There is no history of Pancreatic cancer, Rectal cancer, Stomach cancer, Esophageal cancer, or Cancer.  ROS:   Please see the history of present illness.     All other systems reviewed and are negative.   Labs/Other Tests and Data Reviewed:    Recent Labs: 05/25/2018: Hemoglobin 13.8; Platelets 341 02/07/2019: BUN 12; Creatinine, Ser 0.91; Potassium 4.5; Sodium 135 02/21/2019: ALT 22   Recent Lipid Panel Lab Results  Component Value Date/Time   CHOL 224 (H) 02/21/2019 08:21 AM   TRIG 121 02/21/2019 08:21 AM   HDL 43 02/21/2019 08:21 AM   CHOLHDL 5.2 (H) 02/21/2019 08:21 AM   CHOLHDL 4.6 09/29/2013 10:44 AM   LDLCALC 157 (H) 02/21/2019 08:21 AM    Wt Readings from Last 3 Encounters:  02/25/19 184 lb (83.5 kg)  01/07/19 184 lb (83.5 kg)  10/17/18 190 lb 9.6 oz (86.5 kg)     Objective:    Vital Signs:  Ht 5\' 4"  (1.626 m)    Wt 184 lb (83.5 kg)    BMI 31.58 kg/m    Tried Video but due to technical problems was switched to telephone   ASSESSMENT & PLAN:    1.  ASCAD -coronary CTA showed mild CAD in the prox to mid RCA 25-49% and minimal calcified plaque in the ostial and mid LAD and prox LCX.  Calcium score was 23.  FFR showed no flow limiting lesions -needs aggressive risk factor modification -continue  BB and statin -repeat HbA1C -he stopped the ASA but he does not know why.  I encouraged him to restart the baby ASA 81mg  daily  2.  Hypertension -continue on Bystolic 5mg  daily -I have asked him to check his BP daily for a week and call  with results.   3.  SVT -he has not had any palpitations -continue BB  4.  Chest pain -this is very atypical and associated with wheezing -it is worse laying down on either side but improves when he lays supine -coronary CTA with mild nonobstructive plaque with no flow limiting lesion by FFR -I will refer him to pulmonary since he says that he thinks it is in his trachea and he wheezes when he lays down  5.  Hyperlipidemia -LDL goal is < 70 -LDL was 157 last week -started on Atorvastatin 40mg  daily and will repeat  FLP and ALT in 6 weeks  COVID-19 Education: The signs and symptoms of COVID-19 were discussed with the patient and how to seek care for testing (follow up with PCP or arrange E-visit).  The importance of social distancing was discussed today.  Patient Risk:   After full review of this patient's clinical status, I feel that they are at least moderate risk at this time.  Time:   Today, I have spent 20 minutes directly with the patient on telemedicine discussing medical problems including CAD, CP, HTN, lipids.  We also reviewed the symptoms of COVID 19 and the ways to protect against contracting the virus with telehealth technology.  I spent an additional 5 minutes reviewing patient's chart including coronary CTA.  Medication Adjustments/Labs and Tests Ordered: Current medicines are reviewed at length with the patient today.  Concerns regarding medicines are outlined above.  Tests Ordered: No orders of the defined types were placed in this encounter.  Medication Changes: No orders of the defined types were placed in this encounter.   Disposition:  Follow up 1 year  Signed, Fransico Him, MD  02/25/2019 3:03 PM    [place]

## 2019-02-25 NOTE — Patient Instructions (Signed)
Medication Instructions:  1) START ASPIRIN 81 mg daily If you need a refill on your cardiac medications before your next appointment, please call your pharmacy.   Lab work: Your provider recommends that you return for FASTING blood work. If you have labs (blood work) drawn today and your tests are completely normal, you will receive your results only by: [name] Kitchen MyChart Message (if you have MyChart) OR . A paper copy in the mail If you have any lab test that is abnormal or we need to change your treatment, we will call you to review the results.  Follow-Up: At [facility], you and your health needs are our priority.  As part of our continuing mission to provide you with exceptional heart care, we have created designated Provider Care Teams.  These Care Teams include your primary Cardiologist (physician) and Advanced Practice Providers (APPs -  Physician Assistants and Nurse Practitioners) who all work together to provide you with the care you need, when you need it. You will need a follow up appointment in 12 months.  Please call our office 2 months in advance to schedule this appointment.  You may see Fransico Him, MD or one of the following Advanced Practice Providers on your designated Care Team:   [place], PA-C Melina Copa, PA-C . Ermalinda Barrios, PA-C  Any Other Special Instructions Will Be Listed Below (If Applicable). You have been referred to PULMONARY. They will call you to schedule an appointment.

## 2019-02-26 ENCOUNTER — Telehealth: Payer: Self-pay | Admitting: Cardiology

## 2019-02-26 NOTE — Telephone Encounter (Signed)
Reviewed patient's AVS from yesterday. Scheduled him for fasting lab appointment September 21.  He was grateful for call and agrees with treatment plan.

## 2019-02-26 NOTE — Telephone Encounter (Signed)
Patient states he is returning a phone call.

## 2019-03-12 ENCOUNTER — Encounter: Payer: Self-pay | Admitting: Pulmonary Disease

## 2019-03-12 ENCOUNTER — Other Ambulatory Visit: Payer: Self-pay

## 2019-03-12 ENCOUNTER — Ambulatory Visit (INDEPENDENT_AMBULATORY_CARE_PROVIDER_SITE_OTHER): Payer: PPO | Admitting: Pulmonary Disease

## 2019-03-12 VITALS — BP 122/60 | HR 59 | Temp 98.5°F | Ht 65.0 in | Wt 185.8 lb

## 2019-03-12 DIAGNOSIS — R062 Wheezing: Secondary | ICD-10-CM

## 2019-03-12 DIAGNOSIS — R0609 Other forms of dyspnea: Secondary | ICD-10-CM | POA: Diagnosis not present

## 2019-03-12 DIAGNOSIS — Z6832 Body mass index (BMI) 32.0-32.9, adult: Secondary | ICD-10-CM

## 2019-03-12 DIAGNOSIS — J849 Interstitial pulmonary disease, unspecified: Secondary | ICD-10-CM | POA: Diagnosis not present

## 2019-03-12 NOTE — Patient Instructions (Addendum)
Thank you for visiting Dr. Valeta Harms at [facility]. Today we recommend the following: Orders Placed This Encounter  Procedures  . CT CHEST HIGH RESOLUTION    Return in about 2 weeks (around 03/26/2019) for televisit with Dr. Valeta Harms . Tele -Visit     Please do your part to reduce the spread of COVID-19.

## 2019-03-12 NOTE — Progress Notes (Signed)
Synopsis: Referred in March 2020 for SOB by Horald Pollen, *  Subjective:   [name] Holter GENDER: male DOB: [birth year], MRN: [id number]  Chief Complaint  Patient presents with   Follow-up    He reports having increased chest pain all over his chest.     C/o SOB with exertion and activities.  This has been going on for several years however worsened over the past 6 months.  He has been evaluated by cardiology to include myocardial perfusion imaging as well as a cardiac MRI.  He complains of chest pains and when he describes this he reaches both hands to the chest and brings them out from a centralized location to the outside as well as pain radiating to the back.  This has been going on for a while.  He does state that it is predominantly worse when his shortness of breath has been aggravated.  He has noticed an increase in his weight over the past several months.  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  This was a very dusty environment that he was exposed to at this time.  He recently had pulmonary function tests that revealed a mild restriction with a TLC of 78%, he does have normal spirometry and a DLCO of 112%.  He does admit to seasonal allergies with routine sinus symptoms and sneezing frequently in the springtime.  OV 03/12/2019: Patient seen in clinic today for follow-up regarding dyspnea on exertion and wheezing.  There was clinical concern for potential asthma diagnosis or hypersensitivity.  Today, patient states he used his inhaler that was given to him last time.  For several weeks he recently just finished the samples that was given.  He does not think it made any difference in his symptoms.  He still describes acute onset of  shortness of breath associated twinges in his back radiating around to the center of his chest points to his epigastric region where he has some pains feels like he has tightness when he takes big deep breaths.  He denies anxiety or tachycardia or symptoms of panic.  He is tired of the feeling and wants it to go away but he does not understand why he keeps having it recur.   Past Medical History:  Diagnosis Date   Arthritis    Malignant neoplasm of prostate ([place]) 04/20/2015   Osteoarthritis of left hip 09/21/2012   Prostate cancer [facility])      Family History  Problem Relation Age of Onset   Diabetes Mother    Colon cancer Father    Pancreatic cancer Neg Hx    Rectal cancer Neg Hx    Stomach cancer Neg Hx    Esophageal cancer Neg Hx    Cancer Neg Hx      Past Surgical History:  Procedure Laterality Date   JOINT REPLACEMENT     PROSTATE BIOPSY     TOTAL HIP ARTHROPLASTY Left 09/21/2012   Dr French Ana   TOTAL HIP ARTHROPLASTY Left 09/21/2012   Procedure: TOTAL HIP ARTHROPLASTY;  Surgeon: Yvette Rack., MD;  Location: [facility];  Service: Orthopedics;  Laterality: Left;    Social History   Socioeconomic History   Marital status: Married    Spouse name:  Not on file   Number of children: Not on file   Years of education: Not on file   Highest education level: Not on file  Occupational History   Not on file  Social Needs   Financial resource strain: Not on file   Food insecurity    Worry: Not on file    Inability: Not on file   Transportation needs    Medical: Not on file    Non-medical: Not on file  Tobacco Use   Smoking status: Never Smoker   Smokeless tobacco: Never Used  Substance and Sexual Activity   Alcohol use: No    Comment: occasional   Drug use: No   Sexual activity: Yes  Lifestyle   Physical activity    Days per week: Not on file    Minutes per session: Not on file   Stress: Not on file  Relationships   Social connections     Talks on phone: Not on file    Gets together: Not on file    Attends religious service: Not on file    Active member of club or organization: Not on file    Attends meetings of clubs or organizations: Not on file    Relationship status: Not on file   Intimate partner violence    Fear of current or ex partner: Not on file    Emotionally abused: Not on file    Physically abused: Not on file    Forced sexual activity: Not on file  Other Topics Concern   Not on file  Social History Narrative   Not on file     No Known Allergies   Outpatient Medications Prior to Visit  Medication Sig Dispense Refill   aspirin EC 81 MG tablet Take 1 tablet (81 mg total) by mouth daily. 90 tablet 3   atorvastatin (LIPITOR) 40 MG tablet Take 1 tablet (40 mg total) by mouth daily. 90 tablet 3   nebivolol (BYSTOLIC) 5 MG tablet Take 1 tablet (5 mg total) by mouth daily. 90 tablet 3   No facility-administered medications prior to visit.     Review of Systems  Constitutional: Negative for chills, fever, malaise/fatigue and weight loss.  HENT: Negative for hearing loss, sore throat and tinnitus.   Eyes: Negative for blurred vision and double vision.  Respiratory: Negative for cough, hemoptysis, sputum production, shortness of breath, wheezing and stridor.   Cardiovascular: Positive for chest pain. Negative for palpitations, orthopnea, leg swelling and PND.  Gastrointestinal: Negative for abdominal pain, constipation, diarrhea, heartburn, nausea and vomiting.  Genitourinary: Negative for dysuria, hematuria and urgency.  Musculoskeletal: Negative for joint pain and myalgias.  Skin: Negative for itching and rash.  Neurological: Negative for dizziness, tingling, weakness and headaches.  Endo/Heme/Allergies: Negative for environmental allergies. Does not bruise/bleed easily.  Psychiatric/Behavioral: Negative for depression. The patient is nervous/anxious. The patient does not have insomnia.   All other  systems reviewed and are negative.    Objective:  Physical Exam Vitals signs reviewed.  Constitutional:      General: He is not in acute distress.    Appearance: He is well-developed.  HENT:     Head: Normocephalic and atraumatic.  Eyes:     General: No scleral icterus.    Conjunctiva/sclera: Conjunctivae normal.     Pupils: Pupils are equal, round, and reactive to light.  Neck:     Musculoskeletal: Neck supple.     Vascular: No JVD.     Trachea: No tracheal deviation.  Cardiovascular:     Rate and Rhythm: Normal rate and regular rhythm.     Heart sounds: Normal heart sounds. No murmur.  Pulmonary:     Effort: Pulmonary effort is normal. No tachypnea, accessory muscle usage or respiratory distress.     Breath sounds: Normal breath sounds. No stridor. No wheezing, rhonchi or rales.  Abdominal:     General: Bowel sounds are normal. There is no distension.     Palpations: Abdomen is soft.     Tenderness: There is no abdominal tenderness.  Musculoskeletal:        General: No tenderness.  Lymphadenopathy:     Cervical: No cervical adenopathy.  Skin:    General: Skin is warm and dry.     Capillary Refill: Capillary refill takes less than 2 seconds.     Findings: No rash.  Neurological:     Mental Status: He is alert and oriented to person, place, and time.  Psychiatric:        Behavior: Behavior normal.      Vitals:   03/12/19 1153  BP: 122/60  Pulse: (!) 59  Temp: 98.5 F (36.9 C)  TempSrc: Oral  SpO2: 97%  Weight: 185 lb 12.8 oz (84.3 kg)  Height: 5\' 5"  (1.651 m)   97% on RA BMI Readings from Last 3 Encounters:  03/12/19 30.92 kg/m  02/25/19 31.58 kg/m  01/07/19 31.58 kg/m   Wt Readings from Last 3 Encounters:  03/12/19 185 lb 12.8 oz (84.3 kg)  02/25/19 184 lb (83.5 kg)  01/07/19 184 lb (83.5 kg)     CBC    Component Value Date/Time   WBC 5.3 05/25/2018 1508   WBC 6.9 03/26/2014 1115   WBC 10.3 09/24/2012 0700   RBC 4.72 05/25/2018 1508    RBC 5.07 03/26/2014 1115   RBC 3.47 (L) 09/24/2012 0700   HGB 13.8 05/25/2018 1508   HCT 42.5 05/25/2018 1508   PLT 341 05/25/2018 1508   MCV 90 05/25/2018 1508   MCH 29.2 05/25/2018 1508   MCH 29.4 03/26/2014 1115   MCH 29.7 09/24/2012 0700   MCHC 32.5 05/25/2018 1508   MCHC 32.4 03/26/2014 1115   MCHC 34.2 09/24/2012 0700   RDW 12.5 05/25/2018 1508   LYMPHSABS 1.5 05/25/2018 1508   MONOABS 0.5 09/14/2012 1226   EOSABS 0.1 05/25/2018 1508   BASOSABS 0.0 05/25/2018 1508     Chest Imaging: Chest x-ray 05/25/2018: Bibasilar atelectasis infiltrates. The patient's images have been independently reviewed by me  02/12/2019: CT coronary fractional flow reserve Parenchymal lung images reviewed.  There is some evidence of subpleural sparing and mild parenchymal change there is some basilar infiltrate which is likely atelectasis due to dependence. The patient's images have been independently reviewed by me.    Pulmonary Functions Testing Results: PFT Results Latest Ref Rng & Units 10/08/2018  FVC-Pre L 2.80  FVC-Predicted Pre % 86  FVC-Post L 2.84  FVC-Predicted Post % 87  Pre FEV1/FVC % % 82  Post FEV1/FCV % % 84  FEV1-Pre L 2.29  FEV1-Predicted Pre % 93  FEV1-Post L 2.38  DLCO UNC% % 112  DLCO COR %Predicted % 136  TLC L 4.81  TLC % Predicted % 78  RV % Predicted % 87     FeNO: None   Pathology: None   Echocardiogram:   Nuclear stress EF: 63%.  There was no ST segment deviation noted during stress.  This is a low risk study.  The study is normal.  Heart Catheterization: None     Assessment & Plan:     ICD-10-CM   1. Wheezing  R06.2   2. DOE (dyspnea on exertion)  R06.09   3. BMI 32.0-32.9,adult  Z68.32   4. Interstitial pulmonary disease (HCC)  J84.9 CT CHEST HIGH RESOLUTION    Discussion:  68 year old gentleman ongoing history of recurrent episodes of shortness of breath with associated chest pain.  He describes this as posterior stabbing-like pain  sometimes radiates around to the anterior chest.  He does have allergy symptoms he does have an elevated DLCO seen on prior PFTs.  This is sometimes associated with asthma.  He was treated with a inhaler with no improvement in any of his symptomatology.  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  This can be done in the next available.  We will make sure that his lung parenchyma looks normal.  Otherwise will hopefully be reassuring that we do not see anything wrong within the thorax that may be causing this problem.  I did discuss that this may very well be related to symptoms of anxiety or panic.  He denies this but when this brought up he paused to think of whether or not this could be the cause  We will schedule the HRCT next available. Patient to follow-up via telemetry visit to discuss results. If there is any evidence of ILD would consider sending ILD packet home for him to fill out and then consultation in the ILD clinic with Dr. Vaughan Browner or Dr. Chase Caller.  Greater than 50% of this patient's 17-minute office visit was spent face-to-face discussing above recommendations and treatment plan.   Current Outpatient Medications:    aspirin EC 81 MG tablet, Take 1 tablet (81 mg total) by mouth daily., Disp: 90 tablet, Rfl: 3   atorvastatin (LIPITOR) 40 MG tablet, Take 1 tablet (40 mg total) by mouth daily., Disp: 90 tablet, Rfl: 3   nebivolol (BYSTOLIC) 5 MG tablet, Take 1 tablet (5 mg total) by mouth daily., Disp: 90 tablet, Rfl: 3   Garner Nash, DO [facility] 03/12/2019 12:04 PM

## 2019-03-14 DIAGNOSIS — C61 Malignant neoplasm of prostate: Secondary | ICD-10-CM | POA: Diagnosis not present

## 2019-03-25 ENCOUNTER — Telehealth: Payer: Self-pay

## 2019-03-25 NOTE — Telephone Encounter (Signed)
LMTCB. Patient has an appt 08/25 to discuss CT results. CT is scheduled for 09/03. Please r/s appt for after 09/03 with BI.

## 2019-03-26 ENCOUNTER — Ambulatory Visit: Payer: PPO | Admitting: Pulmonary Disease

## 2019-03-26 ENCOUNTER — Other Ambulatory Visit: Payer: Self-pay

## 2019-04-02 NOTE — Telephone Encounter (Signed)
Appt has been r/s. Nothing further is needed at this time.

## 2019-04-04 ENCOUNTER — Ambulatory Visit (INDEPENDENT_AMBULATORY_CARE_PROVIDER_SITE_OTHER)
Admission: RE | Admit: 2019-04-04 | Discharge: 2019-04-04 | Disposition: A | Discharge status: Home / Self Care | Payer: PPO | Source: Ambulatory Visit | Attending: Pulmonary Disease | Admitting: Pulmonary Disease

## 2019-04-04 ENCOUNTER — Other Ambulatory Visit: Payer: Self-pay

## 2019-04-04 DIAGNOSIS — J849 Interstitial pulmonary disease, unspecified: Secondary | ICD-10-CM | POA: Diagnosis not present

## 2019-04-04 DIAGNOSIS — I700 Atherosclerosis of aorta: Secondary | ICD-10-CM | POA: Diagnosis not present

## 2019-04-09 ENCOUNTER — Telehealth: Payer: Self-pay | Admitting: Pulmonary Disease

## 2019-04-09 ENCOUNTER — Ambulatory Visit (INDEPENDENT_AMBULATORY_CARE_PROVIDER_SITE_OTHER): Payer: PPO | Admitting: Pulmonary Disease

## 2019-04-09 ENCOUNTER — Encounter: Payer: Self-pay | Admitting: Pulmonary Disease

## 2019-04-09 ENCOUNTER — Other Ambulatory Visit: Payer: Self-pay

## 2019-04-09 VITALS — BP 128/78 | HR 67 | Temp 97.1°F | Ht 65.0 in | Wt 185.0 lb

## 2019-04-09 DIAGNOSIS — R0609 Other forms of dyspnea: Secondary | ICD-10-CM | POA: Diagnosis not present

## 2019-04-09 DIAGNOSIS — R0602 Shortness of breath: Secondary | ICD-10-CM

## 2019-04-09 DIAGNOSIS — K219 Gastro-esophageal reflux disease without esophagitis: Secondary | ICD-10-CM

## 2019-04-09 DIAGNOSIS — F419 Anxiety disorder, unspecified: Secondary | ICD-10-CM | POA: Diagnosis not present

## 2019-04-09 NOTE — Patient Instructions (Addendum)
Thank you for visiting Dr. Valeta Harms at [facility]. Today we recommend the following:  Orders Placed This Encounter  Procedures  . Cardiopulmonary exercise test   Return in about 3 months (around 07/09/2019) for with APP or Dr. Valeta Harms.    Please do your part to reduce the spread of COVID-19.

## 2019-04-09 NOTE — Telephone Encounter (Signed)
Returned phone call to patient, I made this patient aware there is a delay on scheduling the stress test. He inquired how long, I made him aware right now we do not know but we are anticipating about 4 weeks. Voiced understanding. Nothing further needed at this time.

## 2019-04-09 NOTE — Progress Notes (Signed)
Synopsis: Referred in March 2020 for SOB by Horald Pollen, *  Subjective:   [name] GENDER: male DOB: 1951/07/29, MRN: [id number]  Chief Complaint  Patient presents with  . Follow-up    Ct results.     C/o SOB with exertion and activities.  This has been going on for several years however worsened over the past 6 months.  He has been evaluated by cardiology to include myocardial perfusion imaging as well as a cardiac MRI.  He complains of chest pains and when he describes this he reaches both hands to the chest and brings them out from a centralized location to the outside as well as pain radiating to the back.  This has been going on for a while.  He does state that it is predominantly worse when his shortness of breath has been aggravated.  He has noticed an increase in his weight over the past several months.  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  This was a very dusty environment that he was exposed to at this time.  He recently had pulmonary function tests that revealed a mild restriction with a TLC of 78%, he does have normal spirometry and a DLCO of 112%.  He does admit to seasonal allergies with routine sinus symptoms and sneezing frequently in the springtime.  OV 03/12/2019: Patient seen in clinic today for follow-up regarding dyspnea on exertion and wheezing.  There was clinical concern for potential asthma diagnosis or hypersensitivity.  Today, patient states he used his inhaler that was given to him last time.  For several weeks he recently just finished the samples that was given.  He does not think it made any difference in his symptoms.  He still describes acute onset of shortness of breath associated twinges in his  back radiating around to the center of his chest points to his epigastric region where he has some pains feels like he has tightness when he takes big deep breaths.  He denies anxiety or tachycardia or symptoms of panic.  He is tired of the feeling and wants it to go away but he does not understand why he keeps having it recur.  OV 04/09/2019: Patient completed HRCT 04/04/2019 which revealed no evidence of interstitial lung disease.  He did have three-vessel coronary disease.  Patient here today for follow-up regarding his CT imaging.  We reviewed this today in the office.  He still has his same ongoing symptoms of dyspnea on exertion.  He describes also ongoing retrosternal pain that feels like it is "stretching inside".  Patient also still is very anxious today in the office.  He is very concerned that these are symptoms of something that is harming him.  Overall they have been going on for several months now and he has had multiple diagnostic studies since then.  We also reviewed the fact that he is approximately 35 pounds overweight to put him into a normal BMI.   Past Medical History:  Diagnosis Date  . Arthritis   . Malignant neoplasm of prostate ([place]) 04/20/2015  . Osteoarthritis of left hip 09/21/2012  . Prostate cancer [facility])      Family History  Problem Relation Age of Onset  . Diabetes Mother   .  Colon cancer Father   . Pancreatic cancer Neg Hx   . Rectal cancer Neg Hx   . Stomach cancer Neg Hx   . Esophageal cancer Neg Hx   . Cancer Neg Hx      Past Surgical History:  Procedure Laterality Date  . JOINT REPLACEMENT    . PROSTATE BIOPSY    . 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 TOTAL HIP ARTHROPLASTY Left 09/21/2012   Procedure: TOTAL HIP ARTHROPLASTY;  Surgeon: Yvette Rack., MD;  Location: [facility];  Service: Orthopedics;  Laterality: Left;    Social History   Socioeconomic History  . Marital status: Married    Spouse name: Not on file  . Number of children:  Not on file  . Years of education: Not on file  . Highest education level: Not on file  Occupational History  . Not on file  Social Needs  . Financial resource strain: Not on file  . Food insecurity    Worry: Not on file    Inability: Not on file  . Transportation needs    Medical: Not on file    Non-medical: Not on file  Tobacco Use  . Smoking status: Never Smoker  . Smokeless tobacco: Never Used  Substance and Sexual Activity  . Alcohol use: No    Comment: occasional  . Drug use: No  . Sexual activity: Yes  Lifestyle  . Physical activity    Days per week: Not on file    Minutes per session: Not on file  . Stress: Not on file  Relationships  . Social Herbalist on phone: Not on file    Gets together: Not on file    Attends religious service: Not on file    Active member of club or organization: Not on file    Attends meetings of clubs or organizations: Not on file    Relationship status: Not on file  . Intimate partner violence    Fear of current or ex partner: Not on file    Emotionally abused: Not on file    Physically abused: Not on file    Forced sexual activity: Not on file  Other Topics Concern  . Not on file  Social History Narrative  . Not on file     No Known Allergies   Outpatient Medications Prior to Visit  Medication Sig Dispense Refill  . aspirin EC 81 MG tablet Take 1 tablet (81 mg total) by mouth daily. 90 tablet 3  . atorvastatin (LIPITOR) 40 MG tablet Take 1 tablet (40 mg total) by mouth daily. 90 tablet 3  . nebivolol (BYSTOLIC) 5 MG tablet Take 1 tablet (5 mg total) by mouth daily. 90 tablet 3   No facility-administered medications prior to visit.     Review of Systems  Constitutional: Negative for chills, fever, malaise/fatigue and weight loss.  HENT: Negative for hearing loss, sore throat and tinnitus.   Eyes: Negative for blurred vision and double vision.  Respiratory: Positive for shortness of breath. Negative for cough,  hemoptysis, sputum production, wheezing and stridor.   Cardiovascular: Negative for chest pain, palpitations, orthopnea, leg swelling and PND.  Gastrointestinal: Negative for abdominal pain, constipation, diarrhea, heartburn, nausea and vomiting.  Genitourinary: Negative for dysuria, hematuria and urgency.  Musculoskeletal: Negative for joint pain and myalgias.  Skin: Negative for itching and rash.  Neurological: Negative for dizziness, tingling, weakness and headaches.  Endo/Heme/Allergies: Negative for environmental allergies. Does  not bruise/bleed easily.  Psychiatric/Behavioral: Negative for depression. The patient is not nervous/anxious and does not have insomnia.   All other systems reviewed and are negative.    Objective:  Physical Exam Vitals signs reviewed.  Constitutional:      General: He is not in acute distress.    Appearance: He is well-developed. He is obese.  HENT:     Head: Normocephalic and atraumatic.  Eyes:     General: No scleral icterus.    Conjunctiva/sclera: Conjunctivae normal.     Pupils: Pupils are equal, round, and reactive to light.  Neck:     Musculoskeletal: Neck supple.     Vascular: No JVD.     Trachea: No tracheal deviation.  Cardiovascular:     Rate and Rhythm: Normal rate and regular rhythm.     Heart sounds: Normal heart sounds. No murmur.  Pulmonary:     Effort: Pulmonary effort is normal. No tachypnea, accessory muscle usage or respiratory distress.     Breath sounds: Normal breath sounds. No stridor. No wheezing, rhonchi or rales.  Abdominal:     General: Bowel sounds are normal. There is no distension.     Palpations: Abdomen is soft.     Tenderness: There is no abdominal tenderness.  Musculoskeletal:        General: No tenderness.  Lymphadenopathy:     Cervical: No cervical adenopathy.  Skin:    General: Skin is warm and dry.     Capillary Refill: Capillary refill takes less than 2 seconds.     Findings: No rash.  Neurological:      Mental Status: He is alert and oriented to person, place, and time.  Psychiatric:        Behavior: Behavior normal.      Vitals:   04/09/19 1033  BP: 128/78  Pulse: 67  Temp: (!) 97.1 F (36.2 C)  TempSrc: Temporal  SpO2: 98%  Weight: 185 lb (83.9 kg)  Height: 5\' 5"  (1.651 m)   98% on RA BMI Readings from Last 3 Encounters:  04/09/19 30.79 kg/m  03/12/19 30.92 kg/m  02/25/19 31.58 kg/m   Wt Readings from Last 3 Encounters:  04/09/19 185 lb (83.9 kg)  03/12/19 185 lb 12.8 oz (84.3 kg)  02/25/19 184 lb (83.5 kg)     CBC    Component Value Date/Time   WBC 5.3 05/25/2018 1508   WBC 6.9 03/26/2014 1115   WBC 10.3 09/24/2012 0700   RBC 4.72 05/25/2018 1508   RBC 5.07 03/26/2014 1115   RBC 3.47 (L) 09/24/2012 0700   HGB 13.8 05/25/2018 1508   HCT 42.5 05/25/2018 1508   PLT 341 05/25/2018 1508   MCV 90 05/25/2018 1508   MCH 29.2 05/25/2018 1508   MCH 29.4 03/26/2014 1115   MCH 29.7 09/24/2012 0700   MCHC 32.5 05/25/2018 1508   MCHC 32.4 03/26/2014 1115   MCHC 34.2 09/24/2012 0700   RDW 12.5 05/25/2018 1508   LYMPHSABS 1.5 05/25/2018 1508   MONOABS 0.5 09/14/2012 1226   EOSABS 0.1 05/25/2018 1508   BASOSABS 0.0 05/25/2018 1508     Chest Imaging: Chest x-ray 05/25/2018: Bibasilar atelectasis infiltrates. The patient's images have been independently reviewed by me  02/12/2019: CT coronary fractional flow reserve Parenchymal lung images reviewed.  There is some evidence of subpleural sparing and mild parenchymal change there is some basilar infiltrate which is likely atelectasis due to dependence. The patient's images have been independently reviewed by me.  04/04/2019: HRCT no  evidence of ILD. The patient's images have been independently reviewed by me.    Pulmonary Functions Testing Results: PFT Results Latest Ref Rng & Units 10/08/2018  FVC-Pre L 2.80  FVC-Predicted Pre % 86  FVC-Post L 2.84  FVC-Predicted Post % 87  Pre FEV1/FVC % % 82  Post  FEV1/FCV % % 84  FEV1-Pre L 2.29  FEV1-Predicted Pre % 93  FEV1-Post L 2.38  DLCO UNC% % 112  DLCO COR %Predicted % 136  TLC L 4.81  TLC % Predicted % 78  RV % Predicted % 87     FeNO: None   Pathology: None   Echocardiogram:   Nuclear stress EF: 63%.  There was no ST segment deviation noted during stress.  This is a low risk study.  The study is normal.  Heart Catheterization: None     Assessment & Plan:     ICD-10-CM   1. SOB (shortness of breath)  R06.02   2. DOE (dyspnea on exertion)  R06.09 Cardiopulmonary exercise test  3. Anxiety  F41.9   4. Gastroesophageal reflux disease without esophagitis  K21.9     Discussion:  68 year old gentleman recurrent ongoing persistent shortness of breath episodes with associated chest pains and chest tightness.  Also feels like there is a central stretching pain.  He has had significant diagnostic evaluations seen by cardiology, coronary flow imaging, high-resolution CT imaging and pulmonary function tests all of which have been rather unrevealing.  Pulmonary function test did reveal an elevated DLCO and an ERV of 49% predicted.  The reduced ERV and elevated DLCO is likely related to BMI.  There was question of possible asthma-like symptoms but he has used inhalers with no help in symptomatology and more or less refuses to continue use to see if they make any difference.  The only thing that I believe can offer further evaluation into his dyspnea would be a cardiopulmonary exercise test.  This will help to ensure he has normal chronotropic response and normal vascular's response to exercise.  Additionally will be able to see if there is a functional limitation to his physical ability which may help demonstrate onset of symptoms.  Greater than 50% of this patient 25-minute of visit was spent face-to-face discussing the above CT imaging, counseling as well as discussion of risk benefits and alternatives of proceeding with cardiopulmonary  exercise testing.   Current Outpatient Medications:  .  aspirin EC 81 MG tablet, Take 1 tablet (81 mg total) by mouth daily., Disp: 90 tablet, Rfl: 3 .  atorvastatin (LIPITOR) 40 MG tablet, Take 1 tablet (40 mg total) by mouth daily., Disp: 90 tablet, Rfl: 3 .  nebivolol (BYSTOLIC) 5 MG tablet, Take 1 tablet (5 mg total) by mouth daily., Disp: 90 tablet, Rfl: 3   Garner Nash, DO [facility] 04/09/2019 10:56 AM

## 2019-04-22 ENCOUNTER — Other Ambulatory Visit: Payer: PPO | Admitting: *Deleted

## 2019-04-22 ENCOUNTER — Other Ambulatory Visit: Payer: Self-pay

## 2019-04-22 DIAGNOSIS — I2583 Coronary atherosclerosis due to lipid rich plaque: Secondary | ICD-10-CM

## 2019-04-22 DIAGNOSIS — I2510 Atherosclerotic heart disease of native coronary artery without angina pectoris: Secondary | ICD-10-CM

## 2019-04-22 DIAGNOSIS — E7800 Pure hypercholesterolemia, unspecified: Secondary | ICD-10-CM | POA: Diagnosis not present

## 2019-04-23 ENCOUNTER — Telehealth: Payer: Self-pay

## 2019-04-23 DIAGNOSIS — E785 Hyperlipidemia, unspecified: Secondary | ICD-10-CM

## 2019-04-23 LAB — ALT: ALT: 22 IU/L (ref 0–44)

## 2019-04-23 LAB — LIPID PANEL
Chol/HDL Ratio: 3.5 ratio (ref 0.0–5.0)
Cholesterol, Total: 144 mg/dL (ref 100–199)
HDL: 41 mg/dL (ref >39)
LDL Chol Calc (NIH): 83 mg/dL (ref 0–99)
Triglycerides: 107 mg/dL (ref 0–149)
VLDL Cholesterol Cal: 20 mg/dL (ref 5–40)

## 2019-04-23 LAB — HEMOGLOBIN A1C
Est. average glucose Bld gHb Est-mCnc: 166 mg/dL
Hgb A1c MFr Bld: 7.4 % — ABNORMAL HIGH (ref 4.8–5.6)

## 2019-04-23 MED ORDER — ATORVASTATIN CALCIUM 80 MG PO TABS: 80.0000 mg | ORAL_TABLET | Freq: Every day | ORAL | 3 refills | Status: DC

## 2019-04-23 NOTE — Telephone Encounter (Signed)
Called patient with lab results. Per Dr. Radford Pax, LDL improved but still elevated - increase Atorvastatin to 80mg  daily and repeat FLP and ALT in 6 weeks. Patient verbalized understanding and will come in on 06/04/19 for lab work.

## 2019-04-23 NOTE — Telephone Encounter (Signed)
-----   Message from Sueanne Margarita, MD sent at 04/23/2019  9:20 AM EDT ----- LDL improved but still elevated - increase Atorvastatin to 80mg  daily and repeat FLP and ALT in 6 weeks

## 2019-04-30 ENCOUNTER — Ambulatory Visit (INDEPENDENT_AMBULATORY_CARE_PROVIDER_SITE_OTHER): Payer: PPO | Admitting: Pulmonary Disease

## 2019-04-30 ENCOUNTER — Ambulatory Visit (INDEPENDENT_AMBULATORY_CARE_PROVIDER_SITE_OTHER): Payer: PPO

## 2019-04-30 ENCOUNTER — Other Ambulatory Visit: Payer: Self-pay

## 2019-04-30 ENCOUNTER — Telehealth: Payer: Self-pay | Admitting: Pulmonary Disease

## 2019-04-30 ENCOUNTER — Telehealth: Payer: Self-pay

## 2019-04-30 ENCOUNTER — Encounter: Payer: Self-pay | Admitting: Pulmonary Disease

## 2019-04-30 VITALS — BP 110/64 | HR 52 | Temp 98.2°F | Ht 65.0 in | Wt 185.4 lb

## 2019-04-30 DIAGNOSIS — I208 Other forms of angina pectoris: Secondary | ICD-10-CM

## 2019-04-30 DIAGNOSIS — R079 Chest pain, unspecified: Secondary | ICD-10-CM

## 2019-04-30 DIAGNOSIS — R0609 Other forms of dyspnea: Secondary | ICD-10-CM

## 2019-04-30 LAB — CBC WITH DIFFERENTIAL/PLATELET
Basophils Absolute: 0 10*3/uL (ref 0.0–0.1)
Basophils Relative: 0.7 % (ref 0.0–3.0)
Eosinophils Absolute: 0.1 10*3/uL (ref 0.0–0.7)
Eosinophils Relative: 1.3 % (ref 0.0–5.0)
HCT: 42 % (ref 39.0–52.0)
Hemoglobin: 14 g/dL (ref 13.0–17.0)
Lymphocytes Relative: 21.4 % (ref 12.0–46.0)
Lymphs Abs: 1.4 10*3/uL (ref 0.7–4.0)
MCHC: 33.4 g/dL (ref 30.0–36.0)
MCV: 92.2 fl (ref 78.0–100.0)
Monocytes Absolute: 0.5 10*3/uL (ref 0.1–1.0)
Monocytes Relative: 7.5 % (ref 3.0–12.0)
Neutro Abs: 4.6 10*3/uL (ref 1.4–7.7)
Neutrophils Relative %: 69.1 % (ref 43.0–77.0)
Platelets: 245 10*3/uL (ref 150.0–400.0)
RBC: 4.56 Mil/uL (ref 4.22–5.81)
RDW: 13.1 % (ref 11.5–15.5)
WBC: 6.7 10*3/uL (ref 4.0–10.5)

## 2019-04-30 LAB — COMPREHENSIVE METABOLIC PANEL
ALT: 25 U/L (ref 0–53)
AST: 18 U/L (ref 0–37)
Albumin: 4.4 g/dL (ref 3.5–5.2)
Alkaline Phosphatase: 48 U/L (ref 39–117)
BUN: 15 mg/dL (ref 6–23)
CO2: 28 mEq/L (ref 19–32)
Calcium: 9.5 mg/dL (ref 8.4–10.5)
Chloride: 103 mEq/L (ref 96–112)
Creatinine, Ser: 0.79 mg/dL (ref 0.40–1.50)
GFR: 118.14 mL/min (ref >60.00)
Glucose, Bld: 217 mg/dL — ABNORMAL HIGH (ref 70–99)
Potassium: 4.2 mEq/L (ref 3.5–5.1)
Sodium: 137 mEq/L (ref 135–145)
Total Bilirubin: 0.5 mg/dL (ref 0.2–1.2)
Total Protein: 6.9 g/dL (ref 6.0–8.3)

## 2019-04-30 LAB — D-DIMER, QUANTITATIVE: D-Dimer, Quant: 0.42 mcg/mL FEU (ref <0.50)

## 2019-04-30 LAB — TROPONIN I (HIGH SENSITIVITY): High Sens Troponin I: 5 ng/L (ref 2–17)

## 2019-04-30 NOTE — Progress Notes (Signed)
Chest x-ray today stable.  No concern for pneumothorax or pneumonia.  This is good news.  No further changes at this time.  If symptoms worsen please present to an emergency room for further evaluation.  I would still recommend that you proceed forward with your cardiopulmonary exercise test as planned.  I would still contact cardiology to schedule a follow-up appointment as you are not due back to see them for a year.  I would also schedule a follow-up appointment with primary care discussed the management of known anxiety.  Wyn Quaker, FNP

## 2019-04-30 NOTE — Assessment & Plan Note (Signed)
Patient continues to report occasional bandlike chest pain, as well as ongoing left-sided chest pain.  EKG today is stable.  Patient with bradycardia.  There is likely a component of anxiety that is contributing to this.  With that said will rule out other acute concerns such as a pulmonary embolism despite the fact that his Wells PE score is low.  We will also rule out concern of MI with a troponin.  We will rule out pneumo with chest x-ray today.  Plan: Lab work today Chest x-ray today EKG today If symptoms worsen patient will need to present to an emergency room for further evaluation

## 2019-04-30 NOTE — Progress Notes (Signed)
Pt notified of proBNP and recommendation for f/u with cardiology. Nothing further needed.

## 2019-04-30 NOTE — Assessment & Plan Note (Signed)
EKG stable today Relatively negative work-up from cardiology as well as pulmonary so far Patient reporting worsened exertional dyspnea  Plan: Lab work today EKG today Chest x-ray today Patient needs to contact cardiology for follow-up visit  Discussed with patient multiple times that if symptoms worsen he will need to present to the emergency room for further evaluation

## 2019-04-30 NOTE — Patient Instructions (Signed)
You were seen today by Lauraine Rinne, NP  for:    If symptoms are not improving please present to the emergency room for further evaluation, as you continue to have ongoing active chest pain that you are reporting is worsening.   1. Chest pain, unspecified type  - Comp Met (CMET); Future - Pro b natriuretic peptide (BNP); Future - CBC with Differential/Platelet; Future - Troponin I; Future - DG Chest 2 View; Future - EKG 12-Lead - D-dimer, quantitative (not at [facility]); Future  We recommend that you contact cardiology and notify them that your symptoms are worsening.  Based off your lab work and EKG today we may recommend that you present to an emergency room for further evaluation.  If symptoms or not improving then you need to present to the emergency room for further evaluation.  Preferably [facility] emergency room as you are having active cardiac symptoms.  2. DOE (dyspnea on exertion)  - Comp Met (CMET); Future - Pro b natriuretic peptide (BNP); Future - CBC with Differential/Platelet; Future - DG Chest 2 View; Future - D-dimer, quantitative (not at [facility]); Future  3. Exertional angina (HCC)  - Comp Met (CMET); Future - Pro b natriuretic peptide (BNP); Future - CBC with Differential/Platelet; Future - Troponin I; Future - DG Chest 2 View; Future - EKG 12-Lead - D-dimer, quantitative (not at [facility]); Future   We recommend today:  Orders Placed This Encounter  Procedures  . DG Chest 2 View    Standing Status:   Future    Number of Occurrences:   1    Standing Expiration Date:   06/29/2020    Order Specific Question:   Reason for Exam (SYMPTOM  OR DIAGNOSIS REQUIRED)    Answer:   chest tightness    Order Specific Question:   Preferred imaging location?    Answer:   Internal    Order Specific Question:   Radiology Contrast Protocol - do NOT remove file path    Answer:   [email or web address]  . Comp Met (CMET)    Standing Status:    Future    Standing Expiration Date:   04/29/2020  . Pro b natriuretic peptide (BNP)    Standing Status:   Future    Standing Expiration Date:   04/29/2020  . CBC with Differential/Platelet    Standing Status:   Future    Standing Expiration Date:   04/29/2020  . Troponin I    Standing Status:   Future    Standing Expiration Date:   04/29/2020  . D-dimer, quantitative (not at [facility])    Standing Status:   Future    Standing Expiration Date:   04/29/2020  . EKG 12-Lead   Orders Placed This Encounter  Procedures  . DG Chest 2 View  . Comp Met (CMET)  . Pro b natriuretic peptide (BNP)  . CBC with Differential/Platelet  . Troponin I  . D-dimer, quantitative (not at [facility])  . EKG 12-Lead   No orders of the defined types were placed in this encounter.   Follow Up:    No follow-ups on file.   Please do your part to reduce the spread of COVID-19:      Reduce your risk of any infection  and COVID19 by using the similar precautions used for avoiding the common cold or flu:  [name] Kitchen Wash your hands often with soap and warm water for at least 20 seconds.  If soap and water are not readily available, use an  alcohol-based hand sanitizer with at least 60% alcohol.  . If coughing or sneezing, cover your mouth and nose by coughing or sneezing into the elbow areas of your shirt or coat, into a tissue or into your sleeve (not your hands). Langley Gauss A MASK when in public  . Avoid shaking hands with others and consider head nods or verbal greetings only. . Avoid touching your eyes, nose, or mouth with unwashed hands.  . Avoid close contact with people who are sick. . Avoid places or events with large numbers of people in one location, like concerts or sporting events. . If you have some symptoms but not all symptoms, continue to monitor at home and seek medical attention if your symptoms worsen. . If you are having a medical emergency, call 911.   [place] / e-Visit: [email or web address]         [facility]: [facility]: [phone]                   [facility]: [phone]     It is flu season:   >>> Best ways to protect herself from the flu: Receive the yearly flu vaccine, practice good hand hygiene washing with soap and also using hand sanitizer when available, eat a nutritious meals, get adequate rest, hydrate appropriately   Please contact the office if your symptoms worsen or you have concerns that you are not improving.   Thank you for choosing [facility] for your healthcare, and for allowing [place] to partner with you on your healthcare journey. I am thankful to be able to provide care to you today.   Wyn Quaker FNP-C

## 2019-04-30 NOTE — Assessment & Plan Note (Signed)
Plan: Chest x-ray today Lab work today EKG today  If symptoms worsen patient would need to present to the emergency room

## 2019-04-30 NOTE — Progress Notes (Signed)
Pt made aware of results of cxr and further recommendation on f/u with cardiology and pcp. Nothing further needed.

## 2019-04-30 NOTE — Telephone Encounter (Signed)
Nothing further needed at this time. See result noted. Will close encounter.

## 2019-04-30 NOTE — Telephone Encounter (Signed)
Call report from [place] with [facility]:  Pro-BNP:143.   Will route message to provider.

## 2019-04-30 NOTE — Progress Notes (Signed)
@Patient  ID: Kenneth Velez, male    DOB: 03-May-1951, 68 y.o.   MRN: [id number]  Chief Complaint  Patient presents with  . Follow-up    Increased chest pain on the right side for the past 2 weeks. Described as a sharp pain.     Referring provider: Horald Pollen, *  HPI:  68 year old male never smoker followed in our office for dyspnea on exertion  PMH: Palpitations, exertional angina, SVT, wide-complex tachycardia Smoker/ Smoking History: Never smoked Maintenance: None Pt of: Dr. Valeta Harms  04/30/2019  - Visit   68 year old male never smoker followed in our office for continued evaluation of dyspnea on exertion as well as intermittent chest pain.  Patient has had a high-resolution CT chest as well as pulmonary function testing.  Both of which were mainly normal.  Pulmonary function test did have an increased DLCO.  Suspect patient may have an aspect of asthma.  Patient has been tried on inhalers in the past and has found no success.  He cannot tell me which inhalers he is tried in the past.  Patient is also been evaluated by Dr. Radford Pax with cardiology where he is being treated for his cholesterol.  Patient continues to have intermittent aspects of chest pain.  He reports that for the last 2 days he has had increased and worsened chest pain.  Patient has had pretty thorough work-up with cardiology as well as with pulmonary for ongoing dyspnea on exertion as well his chest pain.  He is currently waiting be scheduled for a cardiopulmonary exercise test.  He is presenting to our office today's reporting that his shortness of breath and his chest pain is worsening.  He feels that it has gotten worse since he was last evaluated by Dr. Valeta Harms.  He feels that there is a heaviness to his breathing as well as to the chest pain.  Patient has had multiple EKGs in the past that have been reviewed as normal per patient.   Tests:   Chest x-ray 05/25/2018: Bibasilar atelectasis infiltrates.   02/12/2019: CT coronary fractional flow reserve  There is some evidence of subpleural sparing and mild parenchymal change there is some basilar infiltrate which is likely atelectasis due to dependence.  04/04/2019: HRCT no evidence of ILD.  Echocardiogram:   Nuclear stress EF: 63%.  There was no ST segment deviation noted during stress.  This is a low risk study.  The study is normal.  04/30/2019- EKG- sinus bradycardia  FENO:  No results found for: NITRICOXIDE  PFT: PFT Results Latest Ref Rng & Units 10/08/2018  FVC-Pre L 2.80  FVC-Predicted Pre % 86  FVC-Post L 2.84  FVC-Predicted Post % 87  Pre FEV1/FVC % % 82  Post FEV1/FCV % % 84  FEV1-Pre L 2.29  FEV1-Predicted Pre % 93  FEV1-Post L 2.38  DLCO UNC% % 112  DLCO COR %Predicted % 136  TLC L 4.81  TLC % Predicted % 78  RV % Predicted % 87    WALK:  No flowsheet data found.  Imaging: Dg Chest 2 View  Result Date: 04/30/2019 CLINICAL DATA:  Chest tightness and pain with dyspnea EXAM: CHEST - 2 VIEW COMPARISON:  04/04/2019 CT, 05/25/2018 FINDINGS: The heart size and mediastinal contours are within normal limits. Both lungs are clear. The visualized skeletal structures are unremarkable. IMPRESSION: No active cardiopulmonary disease. Electronically Signed   By: Inez Catalina M.D.   On: 04/30/2019 12:30   Ct Chest High Resolution  Result Date:  04/04/2019 CLINICAL DATA:  68 year old male. Evaluate for interstitial lung disease. History of prostate cancer. EXAM: CT CHEST WITHOUT CONTRAST TECHNIQUE: Multidetector CT imaging of the chest was performed following the standard protocol without intravenous contrast. High resolution imaging of the lungs, as well as inspiratory and expiratory imaging, was performed. COMPARISON:  Cardiac CTA 02/08/2019. FINDINGS: Cardiovascular: Heart size is normal. There is no significant pericardial fluid, thickening or pericardial calcification. There is aortic atherosclerosis, as well as  atherosclerosis of the great vessels of the mediastinum and the coronary arteries, including calcified atherosclerotic plaque in the left anterior descending, left circumflex and right coronary arteries. Mediastinum/Nodes: No pathologically enlarged mediastinal or hilar lymph nodes. Please note that accurate exclusion of hilar adenopathy is limited on noncontrast CT scans. Esophagus is unremarkable in appearance. No axillary lymphadenopathy. Lungs/Pleura: High-resolution images demonstrate no significant regions of ground-glass attenuation, septal thickening, subpleural reticulation, parenchymal banding, traction bronchiectasis or frank honeycombing to indicate interstitial lung disease. Inspiratory and expiratory imaging is unremarkable. No acute consolidative airspace disease. No pleural effusions. No suspicious appearing pulmonary nodules or masses are noted. Upper Abdomen: Aortic atherosclerosis. Diffuse low attenuation throughout the visualized portions of the hepatic parenchyma, indicative of hepatic steatosis. Musculoskeletal: There are no aggressive appearing lytic or blastic lesions noted in the visualized portions of the skeleton. IMPRESSION: 1. No findings to suggest interstitial lung disease. 2. No acute findings are noted in the thorax. 3. Aortic atherosclerosis, in addition to three-vessel coronary artery disease. Please note that although the presence of coronary artery calcium documents the presence of coronary artery disease, the severity of this disease and any potential stenosis cannot be assessed on this non-gated CT examination. Assessment for potential risk factor modification, dietary therapy or pharmacologic therapy may be warranted, if clinically indicated. 4. Hepatic steatosis. Aortic Atherosclerosis (ICD10-I70.0). Electronically Signed   By: Vinnie Langton M.D.   On: 04/04/2019 16:47    Lab Results:  CBC    Component Value Date/Time   WBC 5.3 05/25/2018 1508   WBC 6.9 03/26/2014  1115   WBC 10.3 09/24/2012 0700   RBC 4.72 05/25/2018 1508   RBC 5.07 03/26/2014 1115   RBC 3.47 (L) 09/24/2012 0700   HGB 13.8 05/25/2018 1508   HCT 42.5 05/25/2018 1508   PLT 341 05/25/2018 1508   MCV 90 05/25/2018 1508   MCH 29.2 05/25/2018 1508   MCH 29.4 03/26/2014 1115   MCH 29.7 09/24/2012 0700   MCHC 32.5 05/25/2018 1508   MCHC 32.4 03/26/2014 1115   MCHC 34.2 09/24/2012 0700   RDW 12.5 05/25/2018 1508   LYMPHSABS 1.5 05/25/2018 1508   MONOABS 0.5 09/14/2012 1226   EOSABS 0.1 05/25/2018 1508   BASOSABS 0.0 05/25/2018 1508    BMET    Component Value Date/Time   NA 135 02/07/2019 1058   K 4.5 02/07/2019 1058   CL 97 02/07/2019 1058   CO2 23 02/07/2019 1058   GLUCOSE 192 (H) 02/07/2019 1058   GLUCOSE 122 (H) 03/26/2014 1103   BUN 12 02/07/2019 1058   CREATININE 0.91 02/07/2019 1058   CREATININE 0.82 03/26/2014 1103   CALCIUM 9.6 02/07/2019 1058   GFRNONAA 87 02/07/2019 1058   GFRAA 100 02/07/2019 1058    BNP No results found for: BNP  ProBNP No results found for: PROBNP  Specialty Problems      Pulmonary Problems   DOE (dyspnea on exertion)      No Known Allergies  Immunization History  Administered Date(s) Administered  . Influenza Split 05/19/2017  .  Pneumococcal Conjugate-13 11/01/2016  . Tdap 11/01/2016    Past Medical History:  Diagnosis Date  . Arthritis   . Malignant neoplasm of prostate ([place]) 04/20/2015  . Osteoarthritis of left hip 09/21/2012  . Prostate cancer ([place])     Tobacco History: Social History   Tobacco Use  Smoking Status Never Smoker  Smokeless Tobacco Never Used   Counseling given: Yes   Continue to not smoke  Outpatient Encounter Medications as of 04/30/2019  Medication Sig  . aspirin EC 81 MG tablet Take 1 tablet (81 mg total) by mouth daily.  [name] Kitchen atorvastatin (LIPITOR) 80 MG tablet Take 1 tablet (80 mg total) by mouth daily.  . nebivolol (BYSTOLIC) 5 MG tablet Take 1 tablet (5 mg total) by mouth daily.   No  facility-administered encounter medications on file as of 04/30/2019.      Review of Systems  Review of Systems  Constitutional: Positive for fatigue. Negative for activity change, chills, fever and unexpected weight change.  HENT: Negative for postnasal drip, rhinorrhea, sinus pressure, sinus pain, sore throat and trouble swallowing.   Eyes: Negative.   Respiratory: Positive for shortness of breath and wheezing. Negative for cough.   Cardiovascular: Positive for chest pain and palpitations.  Gastrointestinal: Negative for constipation, diarrhea, nausea and vomiting.  Endocrine: Negative.   Genitourinary: Negative.   Musculoskeletal: Negative.   Skin: Negative.   Neurological: Negative for dizziness and headaches.  Psychiatric/Behavioral: Negative.  Negative for dysphoric mood. The patient is not nervous/anxious.   All other systems reviewed and are negative.   Wells Criteria Modified Wells criteria: Clinical assessment for PE Clinical symptoms of DVT (leg swelling, pain with palpation) - 3 Other diagnoses less likely than pulmonary embolism - 3 Heart rate greater than 100 - 1.5 Immobilization (disease greater in 3 days) or surgery in the previous 4 weeks - 1.5 Previous DVT/PE - 1.5 Hemoptysis - 1 Malignancy - 1  Probability Traditional clinical probability assessment (Wells criteria) High - greater than 6 Moderate - 2 to 6 Low less than 2  Simplify clinical probability assessment (modified Wells criteria) PE likely-greater than 4 PE unlikely little less than or equal to 4   Physical Exam  BP 110/64   Pulse (!) 52   Temp 98.2 F (36.8 C) (Temporal)   Ht 5\' 5"  (1.651 m)   Wt 185 lb 6.4 oz (84.1 kg)   SpO2 98%   BMI 30.85 kg/m   Wt Readings from Last 5 Encounters:  04/30/19 185 lb 6.4 oz (84.1 kg)  04/09/19 185 lb (83.9 kg)  03/12/19 185 lb 12.8 oz (84.3 kg)  02/25/19 184 lb (83.5 kg)  01/07/19 184 lb (83.5 kg)    BMI Readings from Last 5 Encounters:   04/30/19 30.85 kg/m  04/09/19 30.79 kg/m  03/12/19 30.92 kg/m  02/25/19 31.58 kg/m  01/07/19 31.58 kg/m    Physical Exam Vitals signs and nursing note reviewed.  Constitutional:      General: He is in acute distress.     Appearance: Normal appearance. He is obese. He is ill-appearing.  HENT:     Head: Normocephalic and atraumatic.     Right Ear: Hearing, tympanic membrane, ear canal and external ear normal.     Left Ear: Hearing, tympanic membrane, ear canal and external ear normal.     Nose: Nose normal. No mucosal edema or rhinorrhea.     Right Turbinates: Not enlarged.     Left Turbinates: Not enlarged.     Mouth/Throat:  Mouth: Mucous membranes are dry.     Pharynx: Oropharynx is clear. No oropharyngeal exudate.  Eyes:     Pupils: Pupils are equal, round, and reactive to light.  Neck:     Musculoskeletal: Normal range of motion.  Cardiovascular:     Rate and Rhythm: Regular rhythm. Bradycardia present.     Pulses: Normal pulses.     Heart sounds: Normal heart sounds. No murmur.  Pulmonary:     Effort: Pulmonary effort is normal.     Breath sounds: Normal breath sounds. No decreased breath sounds, wheezing or rales.  Chest:     Chest wall: No tenderness.  Abdominal:     General: Bowel sounds are normal. There is no distension.     Palpations: Abdomen is soft. There is no mass.     Tenderness: There is no abdominal tenderness.  Musculoskeletal:        General: No swelling or tenderness.     Right lower leg: No edema.     Left lower leg: No edema.  Lymphadenopathy:     Cervical: No cervical adenopathy.  Skin:    General: Skin is warm and dry.     Capillary Refill: Capillary refill takes less than 2 seconds.     Findings: No erythema or rash.  Neurological:     General: No focal deficit present.     Mental Status: He is alert and oriented to person, place, and time.     Motor: No weakness.     Coordination: Coordination normal.     Gait: Gait is intact.  Gait normal.  Psychiatric:        Mood and Affect: Mood normal.        Behavior: Behavior normal. Behavior is cooperative.        Thought Content: Thought content normal.        Judgment: Judgment normal.       Assessment & Plan:   Discussion: Patient presenting to our office today for the first time for myself to evaluate him.  Patient is reporting ongoing active chest pain that is worsened since last office visit.  He has had a very thorough work-up from a cardiac and pulmonary standpoint.  Which is been mainly negative.  Although there could be a component of anxiety or even potentially asthma affecting patient shortness of breath I will still rule out other acute symptoms as patient's reporting this is acutely worsened.  Will order outpatient testing of troponin, d-dimer as well as a repeat chest x-ray last x-ray was done 1 month ago.  EKG performed today was normal as well.  Still showing bradycardia which is baseline for patient.  I explained to the patient multiple times that if symptoms or not improving he still waiting on lab results and he can present to the emergency room for further evaluation.  Have also encouraged the patient to contact cardiology to schedule a follow-up office visit as he is not due back to follow-up with our office for 1 year.  Potentially could trial inhaler use at next office visit but there is no audible wheezing on exam today.  There is no seasonal aspect of patient's breathing.  He does report a bandlike chest pain occasionally.  Which was I will repeat a chest x-ray today to rule out any aspect of a pneumothorax.  Clear breath sounds throughout exam.   Exertional angina (HCC) EKG stable today Relatively negative work-up from cardiology as well as pulmonary so far Patient reporting worsened  exertional dyspnea  Plan: Lab work today EKG today Chest x-ray today Patient needs to contact cardiology for follow-up visit  Discussed with patient multiple  times that if symptoms worsen he will need to present to the emergency room for further evaluation  Chest pain Patient continues to report occasional bandlike chest pain, as well as ongoing left-sided chest pain.  EKG today is stable.  Patient with bradycardia.  There is likely a component of anxiety that is contributing to this.  With that said will rule out other acute concerns such as a pulmonary embolism despite the fact that his Wells PE score is low.  We will also rule out concern of MI with a troponin.  We will rule out pneumo with chest x-ray today.  Plan: Lab work today Chest x-ray today EKG today If symptoms worsen patient will need to present to an emergency room for further evaluation  DOE (dyspnea on exertion) Plan: Chest x-ray today Lab work today EKG today  If symptoms worsen patient would need to present to the emergency room    Return in about 6 weeks (around 06/11/2019), or if symptoms worsen or fail to improve, for Follow up with Wyn Quaker FNP-C, Follow up with Dr. Valeta Harms.   Lauraine Rinne, NP 04/30/2019   This appointment was 42 minutes long with over 50% of the time in direct face-to-face patient care, assessment, plan of care, and follow-up.

## 2019-04-30 NOTE — Progress Notes (Signed)
D-dimer as well as proBNP are unremarkable.  No further changes at this time.  Once again as previously stated patient should establish a follow-up visit with primary care as well as with cardiology.  Proceed forward with cardiopulmonary exercise test when able to be scheduled  Wyn Quaker, FNP

## 2019-04-30 NOTE — Telephone Encounter (Signed)
Noted. No changes.   B

## 2019-04-30 NOTE — Progress Notes (Signed)
Initial lab results have come back.  Showing no acute abnormalities.  If symptoms worsen or continue to persist I recommend that you present to the emergency room for further evaluation.  Wyn Quaker, FNP

## 2019-04-30 NOTE — Progress Notes (Signed)
Pt aware of results. Pt aware of recommendations for f/u with cardiology and pcp. Pt aware to proceed forward with cardiopulm exercise test.

## 2019-04-30 NOTE — Telephone Encounter (Signed)
Call returned to patient, he states his chest pain is getting worse. He reports increased SOB with exertion. He reports he is still waiting to get his cardiopulmonary stress test done. I made him aware the [facility] that he spoke with earlier is already working on that. He inquired as to whether there is a inhaler or something we can give him. I made him aware this will require him being seen/evaluated by a provider. He is requesting a CXR. Appt amde. Nothing further needed at this time.

## 2019-04-30 NOTE — Telephone Encounter (Signed)
Pt was returning my call.  He had called me this morning & left message inquiring about cpx that was to be scheduled.  I had called him back & left vm letting him know they are still backed up and I have sent Landis Martins a list this morning with all of my patient's I am waiting for to have cpx scheduled.  I have just spoken to pt & he states his chest pain is getting worse.  I told him I would send message to triage nurse.

## 2019-05-01 LAB — PRO B NATRIURETIC PEPTIDE: NT-Pro BNP: 143 pg/mL (ref 0–376)

## 2019-05-01 NOTE — Progress Notes (Signed)
Patient's proBNP level is come back normal.  There is no concern for fluid overload at this time.  As previously stated the patient yesterday he will need to follow-up with primary care for management of anxiety.  I also recommend that he follows up with cardiology as he is not due back to see them for a year.  If symptoms worsen or patient feels that chest pain is worsening then he will need to present to the emergency room for further evaluation.  So far test results continue to be unremarkable.  Wyn Quaker, FNP

## 2019-05-01 NOTE — Progress Notes (Signed)
PCCM: Thanks I believe the only thing we can offer is CPEX.  [facility] 05/01/2019 10:07 AM

## 2019-05-06 ENCOUNTER — Ambulatory Visit: Payer: PPO | Admitting: Pulmonary Disease

## 2019-05-13 NOTE — Progress Notes (Signed)
Cardiology Office Note:    Date:  05/14/2019   ID:  Kenneth Velez, DOB [birth year], MRN [id number]  PCP:  Horald Pollen, MD  Cardiologist:  Fransico Him, MD    Referring MD: Horald Pollen, *   Chief Complaint  Patient presents with  . Coronary Artery Disease  . Hypertension  . Hyperlipidemia    History of Present Illness:    Kenneth Velez is a 68 y.o. male with a hx of mild ASCAD with coronary CTA showing mild CAD in the prox to mid RCA 25-49% and minimal calcified plaque in the ostial and mid LAD and prox LCX.  Calcium score was 23.  FFR showed no flow limiting lesion.  2D echo for CPA and SOB showed normal LVEF 60 to 65% with mild LVH and grade 1 DD, nuclear stress test normal, 24-hour monitor showed sinus bradycardia, normal sinus rhythm and sinus tachycardia average heart rate 61 bpm range 41 to 231 bpmandSVT up to 7 beats at a time, frequent PACs and wide-complex tachycardia up to 11 beats with bigeminal PVCswithPVC load less than 1%.He wasplaced on Toprol XL 50 mg in the morning 25mg in the afternoon.   He has continued to have atypical sharp stabbing CP in the back and chest since the coronary CTA that occurs at night and sounded MSK in origin when I saw him in July. He was referred to Pulmonary for evaluation which was unremarkable.  He has a cardiopulmonary test pending next month. Pro BNP was normal.   He has been on Bystolic which has controlled his SVT.   He is here today still with same complaints.  He says that the pain feels like a scratching in the mi sternal area like when he has a cold.  He also has discomfort like a band across his chest that is mainly there at night and worse when he lays on his sides and better when he lays supine.  He feels SOB along with it.  His sx are much worse at night that during the day when he exerts himself.    Past Medical History:  Diagnosis Date  . Arthritis   . Malignant neoplasm of prostate ([place])  04/20/2015  . Osteoarthritis of left hip 09/21/2012  . Prostate cancer [facility])     Past Surgical History:  Procedure Laterality Date  . JOINT REPLACEMENT    . PROSTATE BIOPSY    . 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 TOTAL HIP ARTHROPLASTY Left 09/21/2012   Procedure: TOTAL HIP ARTHROPLASTY;  Surgeon: Yvette Rack., MD;  Location: [place];  Service: Orthopedics;  Laterality: Left;    Current Medications: Current Meds  Medication Sig  . aspirin EC 81 MG tablet Take 1 tablet (81 mg total) by mouth daily.  [name] Kitchen atorvastatin (LIPITOR) 80 MG tablet Take 1 tablet (80 mg total) by mouth daily.  . nebivolol (BYSTOLIC) 5 MG tablet Take 1 tablet (5 mg total) by mouth daily.     Allergies:   Patient has no known allergies.   Social History   Socioeconomic History  . Marital status: Married    Spouse name: Not on file  . Number of children: Not on file  . Years of education: Not on file  . Highest education level: Not on file  Occupational History  . Not on file  Social Needs  . Financial resource strain: Not on file  . Food insecurity    Worry: Not on  file    Inability: Not on file  . Transportation needs    Medical: Not on file    Non-medical: Not on file  Tobacco Use  . Smoking status: Never Smoker  . Smokeless tobacco: Never Used  Substance and Sexual Activity  . Alcohol use: No    Comment: occasional  . Drug use: No  . Sexual activity: Yes  Lifestyle  . Physical activity    Days per week: Not on file    Minutes per session: Not on file  . Stress: Not on file  Relationships  . Social Herbalist on phone: Not on file    Gets together: Not on file    Attends religious service: Not on file    Active member of club or organization: Not on file    Attends meetings of clubs or organizations: Not on file    Relationship status: Not on file  Other Topics Concern  . Not on file  Social History Narrative  . Not on file     Family History: The  patient's family history includes Colon cancer in his father; Diabetes in his mother. There is no history of Pancreatic cancer, Rectal cancer, Stomach cancer, Esophageal cancer, or Cancer.  ROS:   Please see the history of present illness.    ROS  All other systems reviewed and negative.   EKGs/Labs/Other Studies Reviewed:    The following studies were reviewed today: Coronary CTA  EKG:  EKG is not ordered today.    Recent Labs: 04/30/2019: ALT 25; BUN 15; Creatinine, Ser 0.79; Hemoglobin 14.0; NT-Pro BNP 143; Platelets 245.0; Potassium 4.2; Sodium 137   Recent Lipid Panel    Component Value Date/Time   CHOL 144 04/22/2019 0829   TRIG 107 04/22/2019 0829   HDL 41 04/22/2019 0829   CHOLHDL 3.5 04/22/2019 0829   CHOLHDL 4.6 09/29/2013 1044   VLDL 29 09/29/2013 1044   LDLCALC 83 04/22/2019 0829    Physical Exam:    VS:  BP 134/76   Pulse (!) 51   Ht 5\' 5"  (1.651 m)   Wt 184 lb (83.5 kg)   SpO2 96%   BMI 30.62 kg/m     Wt Readings from Last 3 Encounters:  05/14/19 184 lb (83.5 kg)  04/30/19 185 lb 6.4 oz (84.1 kg)  04/09/19 185 lb (83.9 kg)     GEN:  Well nourished, well developed in no acute distress HEENT: Normal NECK: No JVD; No carotid bruits LYMPHATICS: No lymphadenopathy CARDIAC: RRR, no murmurs, rubs, gallops RESPIRATORY:  Clear to auscultation without rales, wheezing or rhonchi  ABDOMEN: Soft, non-tender, non-distended MUSCULOSKELETAL:  No edema; No deformity  SKIN: Warm and dry NEUROLOGIC:  Alert and oriented x 3 PSYCHIATRIC:  Normal affect   ASSESSMENT:    1. Coronary artery disease of native artery of native heart with stable angina pectoris ([place])   2. Essential hypertension   3. Pure hypercholesterolemia   4. SVT (supraventricular tachycardia) (HCC)    PLAN:    In order of problems listed above:  1.  ASCAD -coronary CTA showing mild CAD in the prox to mid RCA 25-49% and minimal calcified plaque in the ostial and mid LAD and prox LCX.   Calcium score was 23.  FFR showed no flow limiting lesion -still has atypical CP that sounds more musculoskeletal in origin and is much worse at night when laying on his sides.  There is some pain with exertion but not like at  night.  He also feels exhausted and gets SOB.   -I am going to repeat a 2D echo to look for pericardial effusion although recent Chest CT did not show any PE.  I will also check a sed rate and CRP -I will get a Lexiscan myoview to rule out ischemia -he has a cardiopulmonary stress test pending next month to try to sort out whether sx are from primary Pulmonary vx cardiac vs deconditioning.  -I think there is a large anxiety component to his sx -if workup normal then only other thing to suggest would be Cardiac MRI to rule out infiltrative dz such as amyloid -continue ASA, statin and BB -I will have him followup with Melina Copa, PA in 3-4 weeks   2.  HTN -BP controlled -continue Bystolic 5mg  daily  3.  HLD -LDL goal < 70 -LDL was 41 -continue Atorvastatin 80mg  daily  4.  SVT -occasionally has some palpitations but seem short lived -continue Bystolic 5mg  daily   Medication Adjustments/Labs and Tests Ordered: Current medicines are reviewed at length with the patient today.  Concerns regarding medicines are outlined above.  No orders of the defined types were placed in this encounter.  No orders of the defined types were placed in this encounter.   Signed, Fransico Him, MD  05/14/2019 8:33 AM    [place]

## 2019-05-14 ENCOUNTER — Encounter: Payer: Self-pay | Admitting: Cardiology

## 2019-05-14 ENCOUNTER — Encounter: Payer: Self-pay | Admitting: *Deleted

## 2019-05-14 ENCOUNTER — Other Ambulatory Visit: Payer: Self-pay

## 2019-05-14 ENCOUNTER — Ambulatory Visit (INDEPENDENT_AMBULATORY_CARE_PROVIDER_SITE_OTHER): Payer: PPO | Admitting: Cardiology

## 2019-05-14 VITALS — BP 134/76 | HR 51 | Ht 65.0 in | Wt 184.0 lb

## 2019-05-14 DIAGNOSIS — E7800 Pure hypercholesterolemia, unspecified: Secondary | ICD-10-CM | POA: Diagnosis not present

## 2019-05-14 DIAGNOSIS — I10 Essential (primary) hypertension: Secondary | ICD-10-CM

## 2019-05-14 DIAGNOSIS — I471 Supraventricular tachycardia: Secondary | ICD-10-CM

## 2019-05-14 DIAGNOSIS — I25118 Atherosclerotic heart disease of native coronary artery with other forms of angina pectoris: Secondary | ICD-10-CM | POA: Diagnosis not present

## 2019-05-14 DIAGNOSIS — R079 Chest pain, unspecified: Secondary | ICD-10-CM

## 2019-05-14 NOTE — Patient Instructions (Addendum)
Medication Instructions:  No changes If you need a refill on your cardiac medications before your next appointment, please call your pharmacy.   Lab work: Today: tsh, crp, esr, flp, alt If you have labs (blood work) drawn today and your tests are completely normal, you will receive your results only by: [name] Kitchen MyChart Message (if you have MyChart) OR . A paper copy in the mail If you have any lab test that is abnormal or we need to change your treatment, we will call you to review the results.  Testing/Procedures: Your physician has requested that you have an echocardiogram. Echocardiography is a painless test that uses sound waves to create images of your heart. It provides your doctor with information about the size and shape of your heart and how well your heart's chambers and valves are working. This procedure takes approximately one hour. There are no restrictions for this procedure.  Your physician has requested that you have a lexiscan myoview. For further information please visit [email or web address]. Please follow instruction sheet, as given.   Follow-Up: At [facility], you and your health needs are our priority.  As part of our continuing mission to provide you with exceptional heart care, we have created designated Provider Care Teams.  These Care Teams include your primary Cardiologist (physician) and Advanced Practice Providers (APPs -  Physician Assistants and Nurse Practitioners) who all work together to provide you with the care you need, when you need it. You will need a follow up appointment in 2-3 weeks.  Please call our office 2 months in advance to schedule this appointment.  You may see one of the following Advanced Practice Providers on your designated Care Team:    Melina Copa, [place] . Ermalinda Barrios, PA-C  Any Other Special Instructions Will Be Listed Below (If Applicable).

## 2019-05-15 LAB — C-REACTIVE PROTEIN: CRP: 1 mg/L (ref 0–10)

## 2019-05-15 LAB — LIPID PANEL
Chol/HDL Ratio: 3 ratio (ref 0.0–5.0)
Cholesterol, Total: 120 mg/dL (ref 100–199)
HDL: 40 mg/dL (ref >39)
LDL Chol Calc (NIH): 63 mg/dL (ref 0–99)
Triglycerides: 88 mg/dL (ref 0–149)
VLDL Cholesterol Cal: 17 mg/dL (ref 5–40)

## 2019-05-15 LAB — TSH: TSH: 2.09 u[IU]/mL (ref 0.450–4.500)

## 2019-05-15 LAB — HEPATIC FUNCTION PANEL
ALT: 32 IU/L (ref 0–44)
AST: 17 IU/L (ref 0–40)
Albumin: 4.5 g/dL (ref 3.8–4.8)
Alkaline Phosphatase: 58 IU/L (ref 39–117)
Bilirubin Total: 0.4 mg/dL (ref 0.0–1.2)
Bilirubin, Direct: 0.13 mg/dL (ref 0.00–0.40)
Total Protein: 6.8 g/dL (ref 6.0–8.5)

## 2019-05-15 LAB — SEDIMENTATION RATE: Sed Rate: 2 mm/hr (ref 0–30)

## 2019-05-16 ENCOUNTER — Encounter (HOSPITAL_COMMUNITY): Payer: PPO

## 2019-05-21 ENCOUNTER — Telehealth (HOSPITAL_COMMUNITY): Payer: Self-pay

## 2019-05-21 NOTE — Telephone Encounter (Signed)
Spoke with the patient, instructions given. Pt stated that he understood and would be here for his test. Asked to call back with any questions. S.Mykel Sponaugle EMTP

## 2019-05-23 ENCOUNTER — Ambulatory Visit (HOSPITAL_BASED_OUTPATIENT_CLINIC_OR_DEPARTMENT_OTHER): Payer: PPO

## 2019-05-23 ENCOUNTER — Ambulatory Visit (HOSPITAL_COMMUNITY): Payer: PPO | Attending: Cardiology

## 2019-05-23 ENCOUNTER — Other Ambulatory Visit: Payer: Self-pay

## 2019-05-23 DIAGNOSIS — I471 Supraventricular tachycardia: Secondary | ICD-10-CM | POA: Insufficient documentation

## 2019-05-23 DIAGNOSIS — E7800 Pure hypercholesterolemia, unspecified: Secondary | ICD-10-CM | POA: Insufficient documentation

## 2019-05-23 DIAGNOSIS — I25118 Atherosclerotic heart disease of native coronary artery with other forms of angina pectoris: Secondary | ICD-10-CM | POA: Insufficient documentation

## 2019-05-23 DIAGNOSIS — I10 Essential (primary) hypertension: Secondary | ICD-10-CM | POA: Diagnosis not present

## 2019-05-23 DIAGNOSIS — R079 Chest pain, unspecified: Secondary | ICD-10-CM | POA: Insufficient documentation

## 2019-05-23 LAB — MYOCARDIAL PERFUSION IMAGING
LV dias vol: 92 mL (ref 62–150)
LV sys vol: 34 mL
Peak HR: 106 {beats}/min
Rest HR: 47 {beats}/min
SDS: 0
SRS: 0
SSS: 0
TID: 0.9

## 2019-05-23 LAB — ECHOCARDIOGRAM COMPLETE
Height: 65 in
Weight: 2944 oz

## 2019-05-23 MED ORDER — TECHNETIUM TC 99M TETROFOSMIN IV KIT
10.9000 | PACK | Freq: Once | INTRAVENOUS | Status: AC | PRN
  Administered 2019-05-23: 10.9 via INTRAVENOUS
  Filled 2019-05-23: qty 11

## 2019-05-23 MED ORDER — PERFLUTREN LIPID MICROSPHERE
1.0000 mL | INTRAVENOUS | Status: AC | PRN
  Administered 2019-05-23: 2 mL via INTRAVENOUS

## 2019-05-23 MED ORDER — TECHNETIUM TC 99M TETROFOSMIN IV KIT
31.9000 | PACK | Freq: Once | INTRAVENOUS | Status: AC | PRN
  Administered 2019-05-23: 31.9 via INTRAVENOUS
  Filled 2019-05-23: qty 32

## 2019-05-23 MED ORDER — REGADENOSON 0.4 MG/5ML IV SOLN
0.4000 mg | Freq: Once | INTRAVENOUS | Status: AC
  Administered 2019-05-23: 0.4 mg via INTRAVENOUS

## 2019-06-03 ENCOUNTER — Other Ambulatory Visit: Payer: Self-pay

## 2019-06-03 ENCOUNTER — Other Ambulatory Visit (HOSPITAL_COMMUNITY): Admission: RE | Admit: 2019-06-03 | Payer: PPO | Source: Ambulatory Visit

## 2019-06-03 DIAGNOSIS — Z20822 Contact with and (suspected) exposure to covid-19: Secondary | ICD-10-CM

## 2019-06-04 ENCOUNTER — Other Ambulatory Visit: Payer: Self-pay

## 2019-06-04 ENCOUNTER — Other Ambulatory Visit: Payer: PPO

## 2019-06-04 DIAGNOSIS — E785 Hyperlipidemia, unspecified: Secondary | ICD-10-CM

## 2019-06-04 LAB — LIPID PANEL
Chol/HDL Ratio: 2.7 ratio (ref 0.0–5.0)
Cholesterol, Total: 120 mg/dL (ref 100–199)
HDL: 44 mg/dL (ref >39)
LDL Chol Calc (NIH): 57 mg/dL (ref 0–99)
Triglycerides: 103 mg/dL (ref 0–149)
VLDL Cholesterol Cal: 19 mg/dL (ref 5–40)

## 2019-06-04 LAB — ALT: ALT: 30 IU/L (ref 0–44)

## 2019-06-04 LAB — NOVEL CORONAVIRUS, NAA: SARS-CoV-2, NAA: NOT DETECTED

## 2019-06-06 ENCOUNTER — Ambulatory Visit (HOSPITAL_COMMUNITY): Payer: PPO | Attending: Cardiology

## 2019-06-06 ENCOUNTER — Other Ambulatory Visit (HOSPITAL_COMMUNITY): Payer: Self-pay | Admitting: *Deleted

## 2019-06-06 ENCOUNTER — Other Ambulatory Visit: Payer: Self-pay

## 2019-06-06 DIAGNOSIS — R0609 Other forms of dyspnea: Secondary | ICD-10-CM

## 2019-06-06 DIAGNOSIS — R0600 Dyspnea, unspecified: Secondary | ICD-10-CM | POA: Diagnosis not present

## 2019-06-10 DIAGNOSIS — R0600 Dyspnea, unspecified: Secondary | ICD-10-CM

## 2019-06-24 ENCOUNTER — Encounter: Payer: Self-pay | Admitting: Cardiology

## 2019-06-24 NOTE — Telephone Encounter (Signed)
Error. Left message in appt notes

## 2019-07-08 NOTE — Progress Notes (Signed)
@Patient  ID: Kenneth Velez, male    DOB: [birth year], 68 y.o.   MRN: [id number]  Chief Complaint  Patient presents with  . Follow-up    F/U on echo and stress test results. States he was cleared by cardiology despite still having occasional chest pain and SOB.     Referring provider: Horald Pollen, *  HPI:  68 year old male never smoker followed in our office for dyspnea on exertion  PMH: Palpitations, exertional angina, SVT, wide-complex tachycardia, anxiety Smoker/ Smoking History: Never smoker Maintenance: None Pt of: Dr. Valeta Harms  07/09/2019  - Visit   68 year old male never smoker followed in our office for dyspnea on exertion.  Patient is also followed by cardiology Dr. Radford Pax.  This has been an ongoing work-up by both pulmonary and cardiology to help figure out what may be driving patient's symptoms of dyspnea on exertion, exertional angina.  Patient presenting today for further evaluation.  He was last seen by cardiology in October/2020.  Patient has since then completed a echocardiogram as well as a cardiopulmonary exercise test.  Those results are listed below:  06/06/2019-cardiopulmonary exercise test-exercise testing with gas exchange demonstrates nor 68 year old male former smoker followed in our office for asthma and obstructive sleep apnea mal functional capacity when compared to match sedentary norms, there is no clear cardiopulmonary limitation, there is significant chronotropic incompetence and hypertensive response to exercise.  Patient's body habitus is playing a role in his exercise intolerance as there was improved PVO 2 with core erections for ideal body weight  05/23/2019-echocardiogram-LV ejection fraction 55 to 60%, mildly increased left ventricular hypertrophy, calcified MV and AV, mildly leaky MV and TV.  Patient reports that he continues to have episodes of exertional angina as well as dyspnea on exertion.  Patient has already completed pulmonary  function testing in March/2020 which was relatively stable.  No formal obstruction.  Read by cardiology as having mild restriction.  FVC 86% predicted.  Patient continues to be adherent to Bystolic, aspirin as well as Lipitor.  His high-resolution CT chest showed no evidence of ILD.  Questionaires / Pulmonary Flowsheets:   MMRC: mMRC Dyspnea Scale mMRC Score  07/09/2019 1    Tests:   Chest x-ray 05/25/2018: Bibasilar atelectasis infiltrates.  02/12/2019: CT coronary fractional flow reserve  There is some evidence of subpleural sparing and mild parenchymal change there is some basilar infiltrate which is likely atelectasis due to dependence.  04/04/2019: HRCT no evidence of ILD.  Echocardiogram:   Nuclear stress EF: 63%.  There was no ST segment deviation noted during stress.  This is a low risk study.  The study is normal.  04/30/2019- EKG- sinus bradycardia  10/08/2018-pulmonary function tests-FVC 2.80 (86% predicted), postbronchodilator ratio 84, postbronchodilator FEV1 2.38 (97% predicted), no bronchodilator response, DLCO 25.82 (112% predicted)  06/06/2019-cardiopulmonary exercise test-exercise testing with gas exchange demonstrates nor 68 year old male former smoker followed in our office for asthma and obstructive sleep apnea mal functional capacity when compared to match sedentary norms, there is no clear cardiopulmonary limitation, there is significant chronotropic incompetence and hypertensive response to exercise.  Patient's body habitus is playing a role in his exercise intolerance as there was improved PVO 2 with core erections for ideal body weight  05/23/2019-echocardiogram-LV ejection fraction 55 to 60%, mildly increased left ventricular hypertrophy, calcified MV and AV, mildly leaky MV and TV.  FENO:  No results found for: NITRICOXIDE  PFT: PFT Results Latest Ref Rng & Units 10/08/2018  FVC-Pre L 2.80  FVC-Predicted Pre %  86  FVC-Post L 2.84  FVC-Predicted Post %  87  Pre FEV1/FVC % % 82  Post FEV1/FCV % % 84  FEV1-Pre L 2.29  FEV1-Predicted Pre % 93  FEV1-Post L 2.38  DLCO UNC% % 112  DLCO COR %Predicted % 136  TLC L 4.81  TLC % Predicted % 78  RV % Predicted % 87    WALK:  No flowsheet data found.  Imaging: No results found.  Lab Results:  CBC    Component Value Date/Time   WBC 6.7 04/30/2019 1227   RBC 4.56 04/30/2019 1227   HGB 14.0 04/30/2019 1227   HGB 13.8 05/25/2018 1508   HCT 42.0 04/30/2019 1227   HCT 42.5 05/25/2018 1508   PLT 245.0 04/30/2019 1227   PLT 341 05/25/2018 1508   MCV 92.2 04/30/2019 1227   MCV 90 05/25/2018 1508   MCH 29.2 05/25/2018 1508   MCH 29.4 03/26/2014 1115   MCH 29.7 09/24/2012 0700   MCHC 33.4 04/30/2019 1227   RDW 13.1 04/30/2019 1227   RDW 12.5 05/25/2018 1508   LYMPHSABS 1.4 04/30/2019 1227   LYMPHSABS 1.5 05/25/2018 1508   MONOABS 0.5 04/30/2019 1227   EOSABS 0.1 04/30/2019 1227   EOSABS 0.1 05/25/2018 1508   BASOSABS 0.0 04/30/2019 1227   BASOSABS 0.0 05/25/2018 1508    BMET    Component Value Date/Time   NA 137 04/30/2019 1227   NA 135 02/07/2019 1058   K 4.2 04/30/2019 1227   CL 103 04/30/2019 1227   CO2 28 04/30/2019 1227   GLUCOSE 217 (H) 04/30/2019 1227   BUN 15 04/30/2019 1227   BUN 12 02/07/2019 1058   CREATININE 0.79 04/30/2019 1227   CREATININE 0.82 03/26/2014 1103   CALCIUM 9.5 04/30/2019 1227   GFRNONAA 87 02/07/2019 1058   GFRAA 100 02/07/2019 1058    BNP No results found for: BNP  ProBNP    Component Value Date/Time   PROBNP 143 04/30/2019 1227    Specialty Problems      Pulmonary Problems   DOE (dyspnea on exertion)      No Known Allergies  Immunization History  Administered Date(s) Administered  . Influenza Split 05/19/2017  . Pneumococcal Conjugate-13 11/01/2016  . Tdap 11/01/2016    Past Medical History:  Diagnosis Date  . Arthritis   . Malignant neoplasm of prostate ([place]) 04/20/2015  . Osteoarthritis of left hip 09/21/2012  .  Prostate cancer ([place])     Tobacco History: Social History   Tobacco Use  Smoking Status Never Smoker  Smokeless Tobacco Never Used   Counseling given: Yes   Continue to not smoke  Outpatient Encounter Medications as of 07/09/2019  Medication Sig  . aspirin EC 81 MG tablet Take 1 tablet (81 mg total) by mouth daily.  [name] Kitchen atorvastatin (LIPITOR) 80 MG tablet Take 1 tablet (80 mg total) by mouth daily.  . nebivolol (BYSTOLIC) 5 MG tablet Take 1 tablet (5 mg total) by mouth daily.   No facility-administered encounter medications on file as of 07/09/2019.      Review of Systems  Review of Systems  Constitutional: Positive for fatigue. Negative for activity change, chills, fever and unexpected weight change.  HENT: Negative for postnasal drip, rhinorrhea, sinus pressure, sinus pain and sore throat.   Eyes: Negative.   Respiratory: Positive for shortness of breath. Negative for cough and wheezing.   Cardiovascular: Positive for chest pain. Negative for palpitations and leg swelling.  Gastrointestinal: Negative for constipation, diarrhea, nausea and  vomiting.  Endocrine: Negative.   Genitourinary: Negative.   Musculoskeletal: Negative.   Skin: Negative.   Neurological: Negative for dizziness and headaches.  Psychiatric/Behavioral: Negative.  Negative for dysphoric mood. The patient is not nervous/anxious.   All other systems reviewed and are negative.    Physical Exam  BP 118/70   Pulse 68   Temp (!) 97.3 F (36.3 C) (Temporal)   Ht 5\' 5"  (1.651 m)   Wt 184 lb 3.2 oz (83.6 kg)   SpO2 98% Comment: RA  BMI 30.65 kg/m   Wt Readings from Last 5 Encounters:  07/09/19 184 lb 3.2 oz (83.6 kg)  05/23/19 184 lb (83.5 kg)  05/14/19 184 lb (83.5 kg)  04/30/19 185 lb 6.4 oz (84.1 kg)  04/09/19 185 lb (83.9 kg)    BMI Readings from Last 5 Encounters:  07/09/19 30.65 kg/m  05/23/19 30.62 kg/m  05/14/19 30.62 kg/m  04/30/19 30.85 kg/m  04/09/19 30.79 kg/m     Physical  Exam Vitals signs and nursing note reviewed.  Constitutional:      General: He is not in acute distress.    Appearance: Normal appearance. He is obese.  HENT:     Head: Normocephalic and atraumatic.     Right Ear: Hearing, tympanic membrane, ear canal and external ear normal. There is no impacted cerumen.     Left Ear: Hearing, tympanic membrane, ear canal and external ear normal. There is no impacted cerumen.     Nose: Nose normal. No mucosal edema or rhinorrhea.     Right Turbinates: Not enlarged.     Left Turbinates: Not enlarged.     Mouth/Throat:     Mouth: Mucous membranes are dry.     Pharynx: Oropharynx is clear. No oropharyngeal exudate.  Eyes:     Pupils: Pupils are equal, round, and reactive to light.  Neck:     Musculoskeletal: Normal range of motion.  Cardiovascular:     Rate and Rhythm: Normal rate and regular rhythm. Frequent extrasystoles are present.    Pulses: Normal pulses.          Radial pulses are 2+ on the right side.     Heart sounds: Normal heart sounds. No murmur.  Pulmonary:     Effort: Pulmonary effort is normal.     Breath sounds: Normal breath sounds. No decreased breath sounds, wheezing or rales.  Musculoskeletal:     Right lower leg: No edema.     Left lower leg: No edema.  Lymphadenopathy:     Cervical: No cervical adenopathy.  Skin:    General: Skin is warm and dry.     Capillary Refill: Capillary refill takes less than 2 seconds.     Findings: No erythema or rash.  Neurological:     General: No focal deficit present.     Mental Status: He is alert and oriented to person, place, and time.     Motor: No weakness.     Coordination: Coordination normal.     Gait: Gait is intact. Gait normal.  Psychiatric:        Mood and Affect: Mood normal.        Behavior: Behavior normal. Behavior is cooperative.        Thought Content: Thought content normal.        Judgment: Judgment normal.       Assessment & Plan:   Exertional angina ([place])  Plan: Refer patient back to cardiology Will route chart to cardiology PA as well  as Dr. Radford Pax Patient needs to contact cardiology for follow-up visit  DOE (dyspnea on exertion) Discussion: This continues to be a difficult work-up for the patient.  He continues to struggle with dyspnea on exertion as well as chest pain and heart palpitations.  He specifically reports that with physical exertion his heart rate goes up and then he feels that his symptoms worsen.  There likely is a multifactorial component to this.  We have an echocardiogram that shows potential structural issues based off of calcifications on valves as well as regurgitation.  Patient has a normal EF.  Patient had a cardiopulmonary exercise test does show chronotropic insufficiency.  This may be directly related to the patient's beta-blocker.  We will refer the patient back to cardiology for an evaluation as cardiology requested the patient follow-up with him in 3 to 4 weeks and this did not happen.  I have discussed the case with Dr. Valeta Harms he agrees.  Patient also has a negative high-resolution CT chest as well as relatively normal pulmonary function testing in March/2020.  Patient does have an elevated BMI and likely also has an anxiety component.  We know the body habitus also is affecting his symptoms.  Plan: Return back to cardiology for future evaluation       Return in about 3 months (around 10/07/2019), or if symptoms worsen or fail to improve, for Follow up with Dr. Valeta Harms.   Lauraine Rinne, NP 07/09/2019   This appointment was 27 minutes long with over 50% of the time in direct face-to-face patient care, assessment, plan of care, and follow-up.

## 2019-07-09 ENCOUNTER — Ambulatory Visit (INDEPENDENT_AMBULATORY_CARE_PROVIDER_SITE_OTHER): Payer: PPO | Admitting: Pulmonary Disease

## 2019-07-09 ENCOUNTER — Encounter: Payer: Self-pay | Admitting: Pulmonary Disease

## 2019-07-09 VITALS — BP 118/70 | HR 68 | Temp 97.3°F | Ht 65.0 in | Wt 184.2 lb

## 2019-07-09 DIAGNOSIS — I208 Other forms of angina pectoris: Secondary | ICD-10-CM

## 2019-07-09 DIAGNOSIS — R0600 Dyspnea, unspecified: Secondary | ICD-10-CM

## 2019-07-09 DIAGNOSIS — R0609 Other forms of dyspnea: Secondary | ICD-10-CM

## 2019-07-09 NOTE — Assessment & Plan Note (Signed)
Discussion: This continues to be a difficult work-up for the patient.  He continues to struggle with dyspnea on exertion as well as chest pain and heart palpitations.  He specifically reports that with physical exertion his heart rate goes up and then he feels that his symptoms worsen.  There likely is a multifactorial component to this.  We have an echocardiogram that shows potential structural issues based off of calcifications on valves as well as regurgitation.  Patient has a normal EF.  Patient had a cardiopulmonary exercise test does show chronotropic insufficiency.  This may be directly related to the patient's beta-blocker.  We will refer the patient back to cardiology for an evaluation as cardiology requested the patient follow-up with him in 3 to 4 weeks and this did not happen.  I have discussed the case with Dr. Valeta Harms he agrees.  Patient also has a negative high-resolution CT chest as well as relatively normal pulmonary function testing in March/2020.  Patient does have an elevated BMI and likely also has an anxiety component.  We know the body habitus also is affecting his symptoms.  Plan: Return back to cardiology for future evaluation

## 2019-07-09 NOTE — Patient Instructions (Addendum)
You were seen today by Lauraine Rinne, NP  for:   1. DOE (dyspnea on exertion)  Please follow back up with cardiology regarding your ongoing symptoms.  Cardiopulmonary exercise test does show features that could be more typical with a cardiovascular issue.  You were also requested to follow-up with the cardiology PA.  I will route my chart to them.  Please contact their office today.  Follow Up:    Return in about 3 months (around 10/07/2019), or if symptoms worsen or fail to improve, for Follow up with Dr. Valeta Harms.   Please do your part to reduce the spread of COVID-19:      Reduce your risk of any infection  and COVID19 by using the similar precautions used for avoiding the common cold or flu:  [name] Kitchen Wash your hands often with soap and warm water for at least 20 seconds.  If soap and water are not readily available, use an alcohol-based hand sanitizer with at least 60% alcohol.  . If coughing or sneezing, cover your mouth and nose by coughing or sneezing into the elbow areas of your shirt or coat, into a tissue or into your sleeve (not your hands). Langley Gauss A MASK when in public  . Avoid shaking hands with others and consider head nods or verbal greetings only. . Avoid touching your eyes, nose, or mouth with unwashed hands.  . Avoid close contact with people who are sick. . Avoid places or events with large numbers of people in one location, like concerts or sporting events. . If you have some symptoms but not all symptoms, continue to monitor at home and seek medical attention if your symptoms worsen. . If you are having a medical emergency, call 911.   [place] / e-Visit: [email or web address]         [facility]: [facility]: [id number]                   [facility]: [id number]     It is flu season:   >>> Best ways to protect  herself from the flu: Receive the yearly flu vaccine, practice good hand hygiene washing with soap and also using hand sanitizer when available, eat a nutritious meals, get adequate rest, hydrate appropriately   Please contact the office if your symptoms worsen or you have concerns that you are not improving.   Thank you for choosing [facility] for your healthcare, and for allowing [place] to partner with you on your healthcare journey. I am thankful to be able to provide care to you today.   Wyn Quaker FNP-C

## 2019-07-09 NOTE — Assessment & Plan Note (Signed)
Plan: Refer patient back to cardiology Will route chart to cardiology PA as well as Dr. Radford Pax Patient needs to contact cardiology for follow-up visit

## 2019-07-09 NOTE — Progress Notes (Signed)
Virtual Visit via Telephone Note   This visit type was conducted due to national recommendations for restrictions regarding the COVID-19 Pandemic (e.g. social distancing) in an effort to limit this patient's exposure and mitigate transmission in our community.  Due to his co-morbid illnesses, this patient is at least at moderate risk for complications without adequate follow up.  This format is felt to be most appropriate for this patient at this time.  The patient did not have access to video technology/had technical difficulties with video requiring transitioning to audio format only (telephone).  All issues noted in this document were discussed and addressed.  No physical exam could be performed with this format.  Please refer to the patient's chart for his  consent to telehealth for [facility].   Date:  07/10/2019   ID:  Kenneth Velez, DOB Mar 19, 1951, MRN [id number]  Patient Location: Home Provider Location: Home  PCP:  Horald Pollen, MD  Cardiologist:  Fransico Him, MD   Electrophysiologist:  None   Evaluation Performed:  Follow-Up Visit  Chief Complaint: short of breath  History of Present Illness:    Kenneth Velez is a 68 y.o. male with history of SVT 7 beats at a time and wide-complex tachycardia 11 beats with bigeminal PVCs and PVC load less than 1% on monitor treated with beta-blocker.  2D echo normal LVEF 60 to 65% with mild LVH and grade 1 DD, normal nuclear stress test.  Patient saw Dr. Radford Pax 09/07/2018 and recommended MRI to rule out RV dysplasia or ventricular arrhythmias.  He was also having 2 types of chest pain one felt to be musculoskeletal the other GI.  She placed him on a PPI and if not improved consider CTA.  I saw the patient and March 2020 still complaining of a lot of dyspnea on exertion and snoring.  Sleep study was normal.  PFTs were relatively normal.  Coronary CTA showed mild CAD in the proximal to mid RCA 25 to 49% and minimal calcified  plaque in the ostial and mid LAD and proximal circumflex.  Calcium score 23.  FFR no flow-limiting lesions.  Dr. Radford Pax restarted his aspirin and start atorvastatin 40 mg daily for an LDL of 157.  Patient last saw Dr. Radford Pax 05/14/2019 with the same atypical stabbing chest pains into his back and chest and scratching and then sternal area when he has a cold.  Also a band across his chest mainly at night and worse when he lays on his side.  Shortness of breath with it.  She ordered a 2D echo 05/23/2019 normal LVEF 55 to 60% mild LVH Lexiscan sed rate, CRP, and also felt a lot of it was anxiety.  She said if always normal consider cardiac MRI to rule out infiltrative disease such as amyloid.    Cardiopulmonary exercise test 06/06/2019 that showed significant chronotropic incompetence and hypertensive response to exercise.  Patient's body habitus was playing exercise intolerance and there was improved peak VO2 with core erections for ideal body weight.  Pulmonary referred back to cardiology.  Patient says he has significant shortness of breath with activity and feels his heart pounding-not necessarily fast but trying to work hard and complete fatigue. I wonder if bystolic is making him feel worse.  The patient does not have symptoms concerning for COVID-19 infection (fever, chills, cough, or new shortness of breath).    Past Medical History:  Diagnosis Date  . Arthritis   . Malignant neoplasm of prostate ([place]) 04/20/2015  .  Osteoarthritis of left hip 09/21/2012  . Prostate cancer [facility])    Past Surgical History:  Procedure Laterality Date  . JOINT REPLACEMENT    . PROSTATE BIOPSY    . 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 TOTAL HIP ARTHROPLASTY Left 09/21/2012   Procedure: TOTAL HIP ARTHROPLASTY;  Surgeon: Yvette Rack., MD;  Location: [place];  Service: Orthopedics;  Laterality: Left;     Current Meds  Medication Sig  . aspirin EC 81 MG tablet Take 1 tablet (81 mg total) by  mouth daily.  [name] Kitchen atorvastatin (LIPITOR) 80 MG tablet Take 1 tablet (80 mg total) by mouth daily.  . nebivolol (BYSTOLIC) 5 MG tablet Take 1 tablet (5 mg total) by mouth daily.  . [DISCONTINUED] atorvastatin (LIPITOR) 80 MG tablet Take 1 tablet (80 mg total) by mouth daily.     Allergies:   Patient has no known allergies.   Social History   Tobacco Use  . Smoking status: Never Smoker  . Smokeless tobacco: Never Used  Substance Use Topics  . Alcohol use: No    Comment: occasional  . Drug use: No     Family Hx: The patient's family history includes Colon cancer in his father; Diabetes in his mother. There is no history of Pancreatic cancer, Rectal cancer, Stomach cancer, Esophageal cancer, or Cancer.  ROS:   Please see the history of present illness.     All other systems reviewed and are negative.   Prior CV studies:   The following studies were reviewed today:  Cardiopulmonary exercise testing 06/06/2019 Conclusion: Exercise testing with gas exchange demonstrates normal functional capacity when compared to matched sedentary norms. There is no clear cardiopulmonary limitation. There is significant chronotropic incompetence and hypertensive response to exercise. Patient's body habitus is playing a role in his exercise intolerance, as there was improved PVO2 with corrections for ideal body weight.     Test, report and preliminary impression by:  Landis Martins, MS, ACSM-RCEP  06/06/2019 12:59 PM   FINALIZED  Marshell Garfinkel MD  [facility]  06/10/2019, 7:56 AM  2D echo 10/22/2020IMPRESSIONS      1. Left ventricular ejection fraction, by visual estimation, is 55 to 60%. The left ventricle has normal function. Normal left ventricular size. There is mildly increased left ventricular hypertrophy.  2. Left ventricular diastolic Doppler parameters are indeterminate pattern of LV diastolic filling.  3. Global right ventricle has normal systolic function.The  right ventricular size is normal. No increase in right ventricular wall thickness.  4. Left atrial size was mildly dilated.  5. Right atrial size was normal.  6. Mild mitral annular calcification.  7. Moderate aortic valve annular calcification.  8. The mitral valve is normal in structure. Mild mitral valve regurgitation. No evidence of mitral stenosis.  9. The tricuspid valve is normal in structure. Tricuspid valve regurgitation is mild. 10. The aortic valve is normal in structure. Aortic valve regurgitation is trivial by color flow Doppler. Mild aortic valve sclerosis without stenosis. 11. There is Mild calcification of the aortic valve. 12. There is Mild thickening of the aortic valve. 13. The pulmonic valve was normal in structure. Pulmonic valve regurgitation is trivial by color flow Doppler. 14. Mildly elevated pulmonary artery systolic pressure. 15. The inferior vena cava is normal in size with greater than 50% respiratory variability, suggesting right atrial pressure of 3 mmHg.    Lexiscan 10/22/2020Nuclear stress EF: 63%.  There was no ST segment deviation  noted during stress.  The study is normal.  This is a low risk study.  The left ventricular ejection fraction is normal (55-65%).  No change from prior study.  Frequent PACs/brief SVT seen with regadenoson infusion. Resolved in recovery   Coronary CTA FFR 7/14/2020FINDINGS: FFRct analysis was performed on the original cardiac CT angiogram dataset. Diagrammatic representation of the FFRct analysis is provided in a separate PDF document in PACS. This dictation was created using the PDF document and an interactive 3D model of the results. 3D model is not available in the EMR/PACS. Normal FFR range is >0.80.   1. Left Main: No significant stenosis: FFR = 0.97.   2. LAD: No significant stenosis: Proximal LAD FFR = 0.94, Mid LAD FFR = 0.95, Distal LAD FFR = 0.83.   3. LCX: No significant stenosis: Proximal LCx FFR =  0.97, Mid LCx FFR = 0.92, Distal LCx FFR = 0.86.   4. RCA: No significant stenosis:Proximal RCA FFR = 0.99, Mid RCA FFR = 0.89, Distal RCA FFR = 0.87, RDPA FFR = 0.84,.   IMPRESSION: 1. CT FFR analysis demonstrates no hemodynamically significant stenosis in the RCA.   Note: These examples are not recommendations of [employer] and only provided as examples of what other customers are doing.     Electronically Signed   By: Fransico Him   On: 02/13/2019 14:00   IMPRESSION: 1. No findings to suggest interstitial lung disease. 2. No acute findings are noted in the thorax. 3. Aortic atherosclerosis, in addition to three-vessel coronary artery disease. Please note that although the presence of coronary artery calcium documents the presence of coronary artery disease, the severity of this disease and any potential stenosis cannot be assessed on this non-gated CT examination. Assessment for potential risk factor modification, dietary therapy or pharmacologic therapy may be warranted, if clinically indicated. 4. Hepatic steatosis.   Aortic Atherosclerosis (ICD10-I70.0).     Electronically Signed   By: Vinnie Langton M.D.   On: 04/04/2019 16:47  IMPRESSION: 1. Coronary calcium score of 23.9. This was 53rd percentile for age and sex matched control.   2. Normal coronary origin with right dominance.   3. Mild atherosclerosis of the RCA 25-49%.   4. Recommend aggressive risk factor modification.   Fransico Him     Electronically Signed   By: Fransico Him   On: 02/11/2019 21:29   Labs/Other Tests and Data Reviewed:    EKG:  No ECG reviewed.  Recent Labs: 04/30/2019: BUN 15; Creatinine, Ser 0.79; Hemoglobin 14.0; NT-Pro BNP 143; Platelets 245.0; Potassium 4.2; Sodium 137 05/14/2019: TSH 2.090 06/04/2019: ALT 30   Recent Lipid Panel Lab Results  Component Value Date/Time   CHOL 120 06/04/2019 08:08 AM   TRIG 103 06/04/2019 08:08 AM   HDL 44 06/04/2019 08:08 AM    CHOLHDL 2.7 06/04/2019 08:08 AM   CHOLHDL 4.6 09/29/2013 10:44 AM   LDLCALC 57 06/04/2019 08:08 AM    Wt Readings from Last 3 Encounters:  07/10/19 184 lb (83.5 kg)  07/09/19 184 lb 3.2 oz (83.6 kg)  05/23/19 184 lb (83.5 kg)     Objective:    Vital Signs:  Wt 184 lb (83.5 kg)   BMI 30.62 kg/m   BP 118/70 P 68   VITAL SIGNS:  reviewed  ASSESSMENT & PLAN:    1. Dyspnea on exertion and chest pain with extensive work-up including coronary CTA calcium score of 23, 25 to 49% RCA, FFR normal without obstruction, Lexiscan Myoview without  ischemia but he did have a brief run of SVT, 2D echo with normal LVEF and minimal valvular disease, unremarkable PFTs, cardiopulmonary stress testing chronotropic incompetence felt related to beta-blocker.  Some symptoms may be related to weight and anxiety. Patient says when he exercises he feels his heart pounding hard and extreme fatigue. Will stop bystolic to and try diltiazem to see if this makes a difference. Consider EPS evaluation if not. F/u with Dr. Radford Pax. 2. SVT could be causing a lot of his problems. Will stop bystolic and try Channel blocker 3. Essential hypertension BP controlled but got high on cardiopulmonary stress testing 4. Hyperlipidemia LDL 157 started on atorvastatin 5. Obesity weight loss essential to overall health  COVID-19 Education: The signs and symptoms of COVID-19 were discussed with the patient and how to seek care for testing (follow up with PCP or arrange E-visit).   The importance of social distancing was discussed today.  Time:   Today, I have spent 8:30  minutes with the patient with telehealth technology discussing the above problems.     Medication Adjustments/Labs and Tests Ordered: Current medicines are reviewed at length with the patient today.  Concerns regarding medicines are outlined above.   Tests Ordered: No orders of the defined types were placed in this encounter.   Medication Changes: Meds ordered  this encounter  Medications  . atorvastatin (LIPITOR) 80 MG tablet    Sig: Take 1 tablet (80 mg total) by mouth daily.    Dispense:  30 tablet    Refill:  11  . diltiazem (CARDIZEM CD) 180 MG 24 hr capsule    Sig: Take 1 capsule (180 mg total) by mouth daily.    Dispense:  30 capsule    Refill:  11    Follow Up:  Either In Person or Virtual in 1 month(s) Dr. Radford Pax  Signed, Ermalinda Barrios, PA-C  07/10/2019 11:57 AM    [place]

## 2019-07-09 NOTE — Progress Notes (Signed)
PCCM: Thank you for seeing him  Garner Nash, DO [facility] 07/09/2019 1:04 PM

## 2019-07-10 ENCOUNTER — Other Ambulatory Visit: Payer: Self-pay

## 2019-07-10 ENCOUNTER — Encounter: Payer: Self-pay | Admitting: Physician Assistant

## 2019-07-10 ENCOUNTER — Telehealth: Payer: Self-pay

## 2019-07-10 ENCOUNTER — Telehealth (INDEPENDENT_AMBULATORY_CARE_PROVIDER_SITE_OTHER): Payer: PPO | Admitting: Physician Assistant

## 2019-07-10 VITALS — Wt 184.0 lb

## 2019-07-10 DIAGNOSIS — I471 Supraventricular tachycardia: Secondary | ICD-10-CM

## 2019-07-10 DIAGNOSIS — E7849 Other hyperlipidemia: Secondary | ICD-10-CM | POA: Diagnosis not present

## 2019-07-10 DIAGNOSIS — R0600 Dyspnea, unspecified: Secondary | ICD-10-CM | POA: Diagnosis not present

## 2019-07-10 DIAGNOSIS — I10 Essential (primary) hypertension: Secondary | ICD-10-CM

## 2019-07-10 DIAGNOSIS — E669 Obesity, unspecified: Secondary | ICD-10-CM | POA: Diagnosis not present

## 2019-07-10 DIAGNOSIS — R0609 Other forms of dyspnea: Secondary | ICD-10-CM

## 2019-07-10 MED ORDER — ATORVASTATIN CALCIUM 80 MG PO TABS: 80.0000 mg | ORAL_TABLET | Freq: Every day | ORAL | 11 refills | Status: DC

## 2019-07-10 MED ORDER — DILTIAZEM HCL ER COATED BEADS 180 MG PO CP24: 180.0000 mg | ORAL_CAPSULE | Freq: Every day | ORAL | 11 refills | Status: DC

## 2019-07-10 NOTE — Telephone Encounter (Signed)
-----   Message from Sueanne Margarita, MD sent at 07/10/2019 12:15 PM EST -----   ----- Message ----- From: Lauraine Rinne, NP Sent: 07/09/2019  10:26 AM EST To: Sueanne Margarita, MD

## 2019-07-10 NOTE — Telephone Encounter (Signed)
Left message for patient regarding recommendations from Dr. Radford Pax after review of notes and tests done with Pulmonary. Referral placed for EP.

## 2019-07-10 NOTE — Patient Instructions (Signed)
Medication Instructions:  Your physician has recommended you make the following change in your medication:   1. STOP: bystolic  2. START: diltiazem 180 mg tablet: Take 1 tablet by mouth once a day  If you need a refill on your cardiac medications before your next appointment, please call your pharmacy.   Lab work: None Ordered  If you have labs (blood work) drawn today and your tests are completely normal, you will receive your results only by: [name] Kitchen MyChart Message (if you have MyChart) OR . A paper copy in the mail If you have any lab test that is abnormal or we need to change your treatment, we will call you to review the results.  Testing/Procedures: None ordered  Follow-Up: Follow up with Dr. Radford Pax on 09/04/19 at 8:20 AM  Any Other Special Instructions Will Be Listed Below (If Applicable).

## 2019-07-10 NOTE — Progress Notes (Signed)
Patient's valvular calcifications are normal for his age.  He has no Mitral stenosis or AS and no valvular insufficiency so these findings would not cause SOB.    Kenneth Velez please refer patient to EP for evaluation.  He has a hx of SVT and is on BB and Cardizem for suppression of this.  His cardiopulmonary stress test showed that he may not be able to increase HR with exercise which may be attributing to his SOB.  Need their guidance on treatment of SVT in setting of chronotropic incompetence with exercise that may be causing DOE.  His coronary CTA showed no obstructive CAD and his CP is VERY atypical and not cardiac in origin.  Please refer back to his PCP for further evaluation of likely musculoskeletal CP.  He can followup with PA in 3 months and with me in 6 months

## 2019-07-18 ENCOUNTER — Encounter: Payer: Self-pay | Admitting: Cardiology

## 2019-08-22 ENCOUNTER — Encounter: Payer: Self-pay | Admitting: Internal Medicine

## 2019-08-22 ENCOUNTER — Ambulatory Visit (INDEPENDENT_AMBULATORY_CARE_PROVIDER_SITE_OTHER): Payer: PPO | Admitting: Internal Medicine

## 2019-08-22 ENCOUNTER — Other Ambulatory Visit: Payer: Self-pay

## 2019-08-22 VITALS — BP 156/72 | HR 74 | Ht 64.0 in | Wt 184.2 lb

## 2019-08-22 DIAGNOSIS — R002 Palpitations: Secondary | ICD-10-CM | POA: Diagnosis not present

## 2019-08-22 MED ORDER — FLECAINIDE ACETATE 50 MG PO TABS: 50.0000 mg | ORAL_TABLET | Freq: Two times a day (BID) | ORAL | 11 refills | Status: DC

## 2019-08-22 NOTE — Patient Instructions (Addendum)
Medication Instructions:  Your physician has recommended you make the following change in your medication:   1.  Start taking flecainide 50 mg---Take one tablet by mouth twice a day  Labwork: None ordered.  Testing/Procedures: None ordered.  Follow-Up: Your physician wants you to follow-up in:  March 2021  October 08, 2019 at 8:45 am with Dr. Lovena Le    Any Other Special Instructions Will Be Listed Below (If Applicable).  If you need a refill on your cardiac medications before your next appointment, please call your pharmacy.   Flecainide tablets What is this medicine? FLECAINIDE (FLEK a nide) is an antiarrhythmic drug. This medicine is used to prevent irregular heart rhythm. It can also slow down fast heartbeats called tachycardia. This medicine may be used for other purposes; ask your health care provider or pharmacist if you have questions. COMMON BRAND NAME(S): Tambocor What should I tell my health care provider before I take this medicine? They need to know if you have any of these conditions:  abnormal levels of potassium in the blood  heart disease including heart rhythm and heart rate problems  kidney or liver disease  recent heart attack  an unusual or allergic reaction to flecainide, local anesthetics, other medicines, foods, dyes, or preservatives  pregnant or trying to get pregnant  breast-feeding How should I use this medicine? Take this medicine by mouth with a glass of water. Follow the directions on the prescription label. You can take this medicine with or without food. Take your doses at regular intervals. Do not take your medicine more often than directed. Do not stop taking this medicine suddenly. This may cause serious, heart-related side effects. If your doctor wants you to stop the medicine, the dose may be slowly lowered over time to avoid any side effects. Talk to your pediatrician regarding the use of this medicine in children. While this drug may be  prescribed for children as young as 1 year of age for selected conditions, precautions do apply. Overdosage: If you think you have taken too much of this medicine contact a poison control center or emergency room at once. NOTE: This medicine is only for you. Do not share this medicine with others. What if I miss a dose? If you miss a dose, take it as soon as you can. If it is almost time for your next dose, take only that dose. Do not take double or extra doses. What may interact with this medicine? Do not take this medicine with any of the following medications:  amoxapine  arsenic trioxide  certain antibiotics like clarithromycin, erythromycin, gatifloxacin, gemifloxacin, levofloxacin, moxifloxacin, sparfloxacin, or troleandomycin  certain antidepressants called tricyclic antidepressants like amitriptyline, imipramine, or nortriptyline  certain medicines to control heart rhythm like disopyramide, encainide, moricizine, procainamide, propafenone, and quinidine  cisapride  delavirdine  droperidol  haloperidol  haw[place]orn  imatinib  levomethadyl  maprotiline  medicines for malaria like chloroquine and halofantrine  pentamidine  phenothiazines like chlorpromazine, mesoridazine, prochlorperazine, thioridazine  pimozide  quinine  ranolazine  ritonavir  sertindole This medicine may also interact with the following medications:  cimetidine  dofetilide  medicines for angina or high blood pressure  medicines to control heart rhythm like amiodarone and digoxin  ziprasidone This list may not describe all possible interactions. Give your health care provider a list of all the medicines, herbs, non-prescription drugs, or dietary supplements you use. Also tell them if you smoke, drink alcohol, or use illegal drugs. Some items may interact with your medicine. What  should I watch for while using this medicine? Visit your doctor or health care professional for regular  checks on your progress. Because your condition and the use of this medicine carries some risk, it is a good idea to carry an identification card, necklace or bracelet with details of your condition, medications and doctor or health care professional. Check your blood pressure and pulse rate regularly. Ask your health care professional what your blood pressure and pulse rate should be, and when you should contact him or her. Your doctor or health care professional also may schedule regular blood tests and electrocardiograms to check your progress. You may get drowsy or dizzy. Do not drive, use machinery, or do anything that needs mental alertness until you know how this medicine affects you. Do not stand or sit up quickly, especially if you are an older patient. This reduces the risk of dizzy or fainting spells. Alcohol can make you more dizzy, increase flushing and rapid heartbeats. Avoid alcoholic drinks. What side effects may I notice from receiving this medicine? Side effects that you should report to your doctor or health care professional as soon as possible:  chest pain, continued irregular heartbeats  difficulty breathing  swelling of the legs or feet  trembling, shaking  unusually weak or tired Side effects that usually do not require medical attention (report to your doctor or health care professional if they continue or are bothersome):  blurred vision  constipation  headache  nausea, vomiting  stomach pain This list may not describe all possible side effects. Call your doctor for medical advice about side effects. You may report side effects to FDA at 1-800-FDA-1088. Where should I keep my medicine? Keep out of the reach of children. Store at room temperature between 15 and 30 degrees C (59 and 86 degrees F). Protect from light. Keep container tightly closed. Throw away any unused medicine after the expiration date. NOTE: This sheet is a summary. It may not cover all  possible information. If you have questions about this medicine, talk to your doctor, pharmacist, or health care provider.  2020 Elsevier/Gold Standard (2018-07-09 11:41:38)

## 2019-08-22 NOTE — Progress Notes (Signed)
HPI [name] is referred today by Kenneth Velez for evaluation of palpitations. He is a pleasant 69 yo man with a h/o HTN, sinus node dysfunction and NS VT and SVT. The patient has non-obstructive CAD. He underwent a CPX test which suggests chronotropic incompetence. However his peak HR was around 100. The patient has not had syncope. He was on bystolic but this was switched to cardizem. In the interim, he continues to have a host of symptoms. He has constant chest heaviness. He has a sharp pain in his back and chest which is not related to exertion. He feels palpitations. These are bothersome to him. He does not feel any better or worse since starting cardizem. He has not had syncope.  No Known Allergies   Current Outpatient Medications  Medication Sig Dispense Refill  . aspirin EC 81 MG tablet Take 1 tablet (81 mg total) by mouth daily. 90 tablet 3  . atorvastatin (LIPITOR) 80 MG tablet Take 1 tablet (80 mg total) by mouth daily. 30 tablet 11  . diltiazem (CARDIZEM CD) 180 MG 24 hr capsule Take 1 capsule (180 mg total) by mouth daily. 30 capsule 11  . tadalafil (CIALIS) 20 MG tablet Take 20 mg by mouth every other day.    . flecainide (TAMBOCOR) 50 MG tablet Take 1 tablet (50 mg total) by mouth 2 (two) times daily. 60 tablet 11   No current facility-administered medications for this visit.     Past Medical History:  Diagnosis Date  . Arthritis   . Malignant neoplasm of prostate ([place]) 04/20/2015  . Osteoarthritis of left hip 09/21/2012  . Prostate cancer ([place])     ROS:   All systems reviewed and negative except as noted in the HPI.   Past Surgical History:  Procedure Laterality Date  . JOINT REPLACEMENT    . PROSTATE BIOPSY    . 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 TOTAL HIP ARTHROPLASTY Left 09/21/2012   Procedure: TOTAL HIP ARTHROPLASTY;  Surgeon: Yvette Rack., MD;  Location: [place] [facility];  Service: Orthopedics;  Laterality: Left;     Family History   Problem Relation Age of Onset  . Diabetes Mother   . Colon cancer Father   . Pancreatic cancer Neg Hx   . Rectal cancer Neg Hx   . Stomach cancer Neg Hx   . Esophageal cancer Neg Hx   . Cancer Neg Hx      Social History   Socioeconomic History  . Marital status: Married    Spouse name: Not on file  . Number of children: Not on file  . Years of education: Not on file  . Highest education level: Not on file  Occupational History  . Not on file  Tobacco Use  . Smoking status: Never Smoker  . Smokeless tobacco: Never Used  Substance and Sexual Activity  . Alcohol use: No    Comment: occasional  . Drug use: No  . Sexual activity: Yes  Other Topics Concern  . Not on file  Social History Narrative  . Not on file   Social Determinants of Health   Financial Resource Strain:   . Difficulty of Paying Living Expenses: Not on file  Food Insecurity:   . Worried About Charity fundraiser in the Last Year: Not on file  . Ran Out of Food in the Last Year: Not on file  Transportation Needs:   . Lack of Transportation (Medical):  Not on file  . Lack of Transportation (Non-Medical): Not on file  Physical Activity:   . Days of Exercise per Week: Not on file  . Minutes of Exercise per Session: Not on file  Stress:   . Feeling of Stress : Not on file  Social Connections:   . Frequency of Communication with Friends and Family: Not on file  . Frequency of Social Gatherings with Friends and Family: Not on file  . Attends Religious Services: Not on file  . Active Member of Clubs or Organizations: Not on file  . Attends Archivist Meetings: Not on file  . Marital Status: Not on file  Intimate Partner Violence:   . Fear of Current or Ex-Partner: Not on file  . Emotionally Abused: Not on file  . Physically Abused: Not on file  . Sexually Abused: Not on file     BP (!) 156/72   Pulse 74   Ht 5\' 4"  (1.626 m)   Wt 184 lb 3.2 oz (83.6 kg)   SpO2 96%   BMI 31.62 kg/m    Physical Exam:  Well appearing 69 yo man, NAD HEENT: Unremarkable Neck:  No JVD, no thyromegally Lymphatics:  No adenopathy Back:  No CVA tenderness Lungs:  Clear with no wheezes HEART:  Regular rate rhythm, no murmurs, no rubs, no clicks Abd:  soft, positive bowel sounds, no organomegally, no rebound, no guarding Ext:  2 plus pulses, no edema, no cyanosis, no clubbing Skin:  No rashes no nodules Neuro:  CN II through XII intact, motor grossly intact  EKG - nsr   Assess/Plan: 1. Palpitations - He is quite symptomatic. I have reviewed his cardiac monitor and he has no arrhythmias that would likely be successfully ablated. It appears that he has PVC's and NSVT as well as multiple atrial tachycardias. I have explained that his arrhythmias are not life threatening and offered him a trial of flecainide as he has normal LV function and no obstructive CAD. We will uptitrate his dose as his ECG and symptoms allow/require. 2. Chest pressure - this appears non-cardiac at least not due to CAD as it is not exertional. 3. HTN - his bp is elevated today. He may require an ACE/ARB 4. Sinus node dysfunction - he does not appear to be symptomatic. His chronotropic incompetence was mild and he does not have a current indication for PPM.  Mikle Bosworth.D.

## 2019-09-02 NOTE — Progress Notes (Signed)
Cardiology Office Note:    Date:  09/04/2019   ID:  Kenneth Velez [birth year], MRN [id number]  PCP:  Horald Pollen, MD  Cardiologist:  Fransico Him, MD    Referring MD: Horald Pollen, *   Chief Complaint  Patient presents with  . Coronary Artery Disease  . Hypertension  . Hyperlipidemia    History of Present Illness:    Kenneth Velez is a 69 y.o. male with a hx of mild ASCAD with coronary CTA showing mild CAD in the prox to mid RCA 25-49% and minimal calcified plaque in the ostial and mid LAD and prox LCX. Calcium score was 23.  FFR showed no flow limiting lesion. 2D echo for CPA and SOB showed normal LVEF 60 to 65% with mild LVH and grade 1 DD, nuclear stress test normal, 24-hour monitor showed sinus bradycardia, normal sinus rhythm and sinus tachycardia average heart rate 61 bpm range 41 to 231 bpmandSVT up to 7 beats at a time, frequent PACs and wide-complex tachycardia up to 11 beats with bigeminal PVCswithPVC load less than 1%.He wasplaced on Toprol XL 50 mg in the morning 25mg in the afternoon.  He continued to have atypical sharp stabbing CP in the back despite coronary CTA showing  mild CAD in the proximal to mid RCA 25 to 49% and minimal calcified plaque in the ostial and mid LAD and proximal circumflex.  Calcium score 23.  FFR no flow-limiting lesions.  CP was very atypical and felt not cardiac in origin.  He was referred to Pulmonary for evaluation which was unremarkable. His cardiopulmonary stress test showed that he may not be able to increase HR with exercise which may be attributing to his SOB. He was seen back by Estella Husk, PA in 07/2019 and he was complaining of continued SOB and CP.  His sx were felt to possibly be due to weight and anxiety.  He was also complaining severe fatigue and his Bystolic was stopped and started on Diltiazem for palpitations.  He was seen by Cristopher Peru, MD 08/22/2019 for ongoing palpitations.  His heart  monitor showed PVCs, NSVT and multiple atrial tachycardias.  He was started on Flecainide.  He is here today for followup and is doing very well.  Since seeing him last he has significantly improved.  After being placed on Flecainide his SOB has completely resolved and was likely related to arrhythmias.  He still has the sharp CP that goes through to his back which is chronic and noncardiac.  He denies any  PND, orthopnea, LE edema, dizziness, palpitations or syncope. He is compliant with his meds and is tolerating meds with no SE.    Past Medical History:  Diagnosis Date  . Arthritis   . Malignant neoplasm of prostate ([place]) 04/20/2015  . Osteoarthritis of left hip 09/21/2012  . Prostate cancer [facility])     Past Surgical History:  Procedure Laterality Date  . JOINT REPLACEMENT    . PROSTATE BIOPSY    . 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 TOTAL HIP ARTHROPLASTY Left 09/21/2012   Procedure: TOTAL HIP ARTHROPLASTY;  Surgeon: Yvette Rack., MD;  Location: [place];  Service: Orthopedics;  Laterality: Left;    Current Medications: Current Meds  Medication Sig  . aspirin EC 81 MG tablet Take 1 tablet (81 mg total) by mouth daily.  [name] Kitchen atorvastatin (LIPITOR) 80 MG tablet Take 1 tablet (80 mg total) by mouth daily.  [name] Kitchen diltiazem (  CARDIZEM CD) 180 MG 24 hr capsule Take 1 capsule (180 mg total) by mouth daily.  . flecainide (TAMBOCOR) 50 MG tablet Take 1 tablet (50 mg total) by mouth 2 (two) times daily.  . tadalafil (CIALIS) 20 MG tablet Take 20 mg by mouth every other day.     Allergies:   Patient has no known allergies.   Social History   Socioeconomic History  . Marital status: Married    Spouse name: Not on file  . Number of children: Not on file  . Years of education: Not on file  . Highest education level: Not on file  Occupational History  . Not on file  Tobacco Use  . Smoking status: Never Smoker  . Smokeless tobacco: Never Used  Substance and Sexual Activity  .  Alcohol use: No    Comment: occasional  . Drug use: No  . Sexual activity: Yes  Other Topics Concern  . Not on file  Social History Narrative  . Not on file   Social Determinants of Health   Financial Resource Strain:   . Difficulty of Paying Living Expenses: Not on file  Food Insecurity:   . Worried About Charity fundraiser in the Last Year: Not on file  . Ran Out of Food in the Last Year: Not on file  Transportation Needs:   . Lack of Transportation (Medical): Not on file  . Lack of Transportation (Non-Medical): Not on file  Physical Activity:   . Days of Exercise per Week: Not on file  . Minutes of Exercise per Session: Not on file  Stress:   . Feeling of Stress : Not on file  Social Connections:   . Frequency of Communication with Friends and Family: Not on file  . Frequency of Social Gatherings with Friends and Family: Not on file  . Attends Religious Services: Not on file  . Active Member of Clubs or Organizations: Not on file  . Attends Archivist Meetings: Not on file  . Marital Status: Not on file     Family History: The patient's family history includes Colon cancer in his father; Diabetes in his mother. There is no history of Pancreatic cancer, Rectal cancer, Stomach cancer, Esophageal cancer, or Cancer.  ROS:   Please see the history of present illness.    ROS  All other systems reviewed and negative.   EKGs/Labs/Other Studies Reviewed:    The following studies were reviewed today: EKG. Nuclear stress test, coronary CTA< 2D echo  EKG:  EKG is  ordered today and showed NSR with normal intervals  Recent Labs: 04/30/2019: BUN 15; Creatinine, Ser 0.79; Hemoglobin 14.0; NT-Pro BNP 143; Platelets 245.0; Potassium 4.2; Sodium 137 05/14/2019: TSH 2.090 06/04/2019: ALT 30   Recent Lipid Panel    Component Value Date/Time   CHOL 120 06/04/2019 0808   TRIG 103 06/04/2019 0808   HDL 44 06/04/2019 0808   CHOLHDL 2.7 06/04/2019 0808   CHOLHDL 4.6  09/29/2013 1044   VLDL 29 09/29/2013 1044   LDLCALC 57 06/04/2019 0808    Physical Exam:    VS:  BP (!) 154/70   Pulse 65   Ht 5\' 4"  (1.626 m)   Wt 184 lb 12.8 oz (83.8 kg)   BMI 31.72 kg/m     Wt Readings from Last 3 Encounters:  09/04/19 184 lb 12.8 oz (83.8 kg)  08/22/19 184 lb 3.2 oz (83.6 kg)  07/10/19 184 lb (83.5 kg)     GEN:  Well nourished, well developed in no acute distress HEENT: Normal NECK: No JVD; No carotid bruits LYMPHATICS: No lymphadenopathy CARDIAC: RRR, no murmurs, rubs, gallops RESPIRATORY:  Clear to auscultation without rales, wheezing or rhonchi  ABDOMEN: Soft, non-tender, non-distended MUSCULOSKELETAL:  No edema; No deformity  SKIN: Warm and dry NEUROLOGIC:  Alert and oriented x 3 PSYCHIATRIC:  Normal affect   ASSESSMENT:    1. Coronary artery disease of native artery of native heart with stable angina pectoris ([place])   2. Benign essential HTN   3. Hyperlipidemia LDL goal <70   4. SVT (supraventricular tachycardia) ([place])   5. Sinus node dysfunction ([facility])    PLAN:    In order of problems listed above:  1.  ASCAD -coronary CTA showed mild CAD in the prox to mid RCA 25-49% and minimal calcified plaque in the ostial and mid LAD and prox LCX. Calcium score was 23.  FFR showed no flow limiting lesion -he has continued chronic atypical CP that is sharp in his back and chest and not related to exertion with constant chest heaviness -  likely MSK with a high anxiety component was well.   -Lexiscan myoview showed no ischemia and 2D echo showed normal LVF -Cardiopulmonary stress test showed normal functional capacity with no cardiopulmonary limitation but did have a significant chronotropic incompetence and hypertensive response to exercise -Referred to pulmonary with negative workup -BB changed to CCB in case this was causing sx but no significant improvement on last OV with Estella Husk, PA but now SOB has resolved after starting Flecainide.   -continue ASA, statin and CCB  2.  HTN -BP borderline controlled -continue Cardizem CD 180mg  daily  3.  HLD -LDL goal < 70 -LDL was 57 in Nov 2020 -continue Atorvastatin 80mg  daily  4.  SVT -occasionally has some palpitations but seem short lived -continue Cardizem CD 180mg  daily and Flecainide 50mg  BID -set up ETT to assess for exercise induced arrhythmias  5.  Sinus node dysfunction -chronotropic incompetence on CPTX -no indication per EP for PPM at this time   Medication Adjustments/Labs and Tests Ordered: Current medicines are reviewed at length with the patient today.  Concerns regarding medicines are outlined above.  Orders Placed This Encounter  Procedures  . EKG 12-Lead   No orders of the defined types were placed in this encounter.   Signed, Fransico Him, MD  09/04/2019 8:42 AM    Ord

## 2019-09-04 ENCOUNTER — Other Ambulatory Visit: Payer: Self-pay

## 2019-09-04 ENCOUNTER — Ambulatory Visit (INDEPENDENT_AMBULATORY_CARE_PROVIDER_SITE_OTHER): Payer: PPO | Admitting: Cardiology

## 2019-09-04 ENCOUNTER — Encounter: Payer: Self-pay | Admitting: Cardiology

## 2019-09-04 VITALS — BP 154/70 | HR 65 | Ht 64.0 in | Wt 184.8 lb

## 2019-09-04 DIAGNOSIS — I471 Supraventricular tachycardia: Secondary | ICD-10-CM | POA: Diagnosis not present

## 2019-09-04 DIAGNOSIS — I25118 Atherosclerotic heart disease of native coronary artery with other forms of angina pectoris: Secondary | ICD-10-CM | POA: Diagnosis not present

## 2019-09-04 DIAGNOSIS — E785 Hyperlipidemia, unspecified: Secondary | ICD-10-CM

## 2019-09-04 DIAGNOSIS — I10 Essential (primary) hypertension: Secondary | ICD-10-CM

## 2019-09-04 DIAGNOSIS — I495 Sick sinus syndrome: Secondary | ICD-10-CM

## 2019-09-04 DIAGNOSIS — Z79899 Other long term (current) drug therapy: Secondary | ICD-10-CM

## 2019-09-06 ENCOUNTER — Other Ambulatory Visit (HOSPITAL_COMMUNITY)
Admission: RE | Admit: 2019-09-06 | Discharge: 2019-09-06 | Disposition: A | Discharge status: Home / Self Care | Payer: PPO | Source: Ambulatory Visit | Attending: Cardiology | Admitting: Cardiology

## 2019-09-06 DIAGNOSIS — Z01812 Encounter for preprocedural laboratory examination: Secondary | ICD-10-CM | POA: Insufficient documentation

## 2019-09-06 DIAGNOSIS — Z20822 Contact with and (suspected) exposure to covid-19: Secondary | ICD-10-CM | POA: Insufficient documentation

## 2019-09-06 LAB — SARS CORONAVIRUS 2 (TAT 6-24 HRS): SARS Coronavirus 2: NEGATIVE

## 2019-09-10 ENCOUNTER — Other Ambulatory Visit: Payer: Self-pay

## 2019-09-10 ENCOUNTER — Telehealth: Payer: Self-pay | Admitting: *Deleted

## 2019-09-10 ENCOUNTER — Ambulatory Visit (INDEPENDENT_AMBULATORY_CARE_PROVIDER_SITE_OTHER): Payer: PPO

## 2019-09-10 DIAGNOSIS — Z79899 Other long term (current) drug therapy: Secondary | ICD-10-CM | POA: Diagnosis not present

## 2019-09-10 LAB — EXERCISE TOLERANCE TEST
Estimated workload: 7 METS
Exercise duration (min): 5 min
Exercise duration (sec): 57 s
MPHR: 152 {beats}/min
Peak HR: 130 {beats}/min
Percent HR: 85 %
RPE: 17
Rest HR: 73 {beats}/min

## 2019-09-10 NOTE — Telephone Encounter (Signed)
-----   Message from Sueanne Margarita, MD sent at 09/10/2019  2:23 PM EST ----- Please let patient know that stress test was fine but BP became very high.  Please find out if he held his Cardizem for stress test

## 2019-09-10 NOTE — Telephone Encounter (Signed)
Left message to call office

## 2019-09-11 ENCOUNTER — Telehealth: Payer: Self-pay

## 2019-09-11 DIAGNOSIS — I10 Essential (primary) hypertension: Secondary | ICD-10-CM

## 2019-09-11 NOTE — Telephone Encounter (Signed)
Patient advised that he will be ordered a 24 hour BP monitor.

## 2019-09-11 NOTE — Telephone Encounter (Signed)
-----   Message from Sueanne Margarita, MD sent at 09/11/2019  4:15 PM EST ----- Please order a 24 hr BP monitor  Traci ----- Message ----- From: Antonieta Iba, RN Sent: 09/11/2019   2:34 PM EST To: Sueanne Margarita, MD  The patient has been notified of the result and verbalized understanding.  All questions (if any) were answered. Antonieta Iba, RN 09/11/2019 2:34 PM   Patient states that he took his Cardizem prior to the stress test.

## 2019-09-17 ENCOUNTER — Telehealth: Payer: Self-pay | Admitting: *Deleted

## 2019-09-17 NOTE — Telephone Encounter (Signed)
Patient scheduled to have 24 hour ambulatory blood pressure monitor applied 09/19/2019.

## 2019-09-26 ENCOUNTER — Other Ambulatory Visit: Payer: Self-pay | Admitting: Cardiology

## 2019-09-26 ENCOUNTER — Other Ambulatory Visit: Payer: Self-pay

## 2019-09-26 ENCOUNTER — Ambulatory Visit (INDEPENDENT_AMBULATORY_CARE_PROVIDER_SITE_OTHER): Payer: PPO

## 2019-09-26 DIAGNOSIS — I10 Essential (primary) hypertension: Secondary | ICD-10-CM

## 2019-10-08 ENCOUNTER — Other Ambulatory Visit: Payer: Self-pay

## 2019-10-08 ENCOUNTER — Ambulatory Visit (INDEPENDENT_AMBULATORY_CARE_PROVIDER_SITE_OTHER): Payer: PPO | Admitting: Internal Medicine

## 2019-10-08 ENCOUNTER — Encounter: Payer: Self-pay | Admitting: Internal Medicine

## 2019-10-08 VITALS — BP 146/74 | HR 66 | Ht 64.0 in | Wt 182.6 lb

## 2019-10-08 DIAGNOSIS — I495 Sick sinus syndrome: Secondary | ICD-10-CM | POA: Insufficient documentation

## 2019-10-08 DIAGNOSIS — I10 Essential (primary) hypertension: Secondary | ICD-10-CM | POA: Diagnosis not present

## 2019-10-08 DIAGNOSIS — R002 Palpitations: Secondary | ICD-10-CM | POA: Diagnosis not present

## 2019-10-08 DIAGNOSIS — I152 Hypertension secondary to endocrine disorders: Secondary | ICD-10-CM | POA: Insufficient documentation

## 2019-10-08 DIAGNOSIS — E1159 Type 2 diabetes mellitus with other circulatory complications: Secondary | ICD-10-CM | POA: Insufficient documentation

## 2019-10-08 NOTE — Patient Instructions (Addendum)
Medication Instructions:  Your physician recommends that you continue on your current medications as directed. Please refer to the Current Medication list given to you today.  Labwork: None ordered.  Testing/Procedures: None ordered.  Follow-Up: Your physician wants you to follow-up in: as needed with Dr. Taylor.      Any Other Special Instructions Will Be Listed Below (If Applicable).  If you need a refill on your cardiac medications before your next appointment, please call your pharmacy.   

## 2019-10-08 NOTE — Progress Notes (Signed)
HPI Kenneth Velez returns today for followup. He is a pleasant 69 yo man with palpitations and documented PAC's, NS AT, and NSVT and PVC's. I started him on low dose flecainide 6 weeks ago. Since then his symptoms have resolved. He denies palpitations since starting flecainide 50 mg twice daily. He admits to being a little sedentary. No Known Allergies   Current Outpatient Medications  Medication Sig Dispense Refill  . aspirin EC 81 MG tablet Take 1 tablet (81 mg total) by mouth daily. 90 tablet 3  . atorvastatin (LIPITOR) 80 MG tablet Take 1 tablet (80 mg total) by mouth daily. 30 tablet 11  . diltiazem (CARDIZEM CD) 180 MG 24 hr capsule Take 1 capsule (180 mg total) by mouth daily. 30 capsule 11  . flecainide (TAMBOCOR) 50 MG tablet Take 1 tablet (50 mg total) by mouth 2 (two) times daily. 60 tablet 11  . tadalafil (CIALIS) 20 MG tablet Take 20 mg by mouth every other day.     No current facility-administered medications for this visit.     Past Medical History:  Diagnosis Date  . Arthritis   . Malignant neoplasm of prostate ([place]) 04/20/2015  . Osteoarthritis of left hip 09/21/2012  . Prostate cancer ([place])     ROS:   All systems reviewed and negative except as noted in the HPI.   Past Surgical History:  Procedure Laterality Date  . JOINT REPLACEMENT    . PROSTATE BIOPSY    . 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 TOTAL HIP ARTHROPLASTY Left 09/21/2012   Procedure: TOTAL HIP ARTHROPLASTY;  Surgeon: Yvette Rack., MD;  Location: [place];  Service: Orthopedics;  Laterality: Left;     Family History  Problem Relation Age of Onset  . Diabetes Mother   . Colon cancer Father   . Pancreatic cancer Neg Hx   . Rectal cancer Neg Hx   . Stomach cancer Neg Hx   . Esophageal cancer Neg Hx   . Cancer Neg Hx      Social History   Socioeconomic History  . Marital status: Married    Spouse name: Not on file  . Number of children: Not on file  . Years of  education: Not on file  . Highest education level: Not on file  Occupational History  . Not on file  Tobacco Use  . Smoking status: Never Smoker  . Smokeless tobacco: Never Used  Substance and Sexual Activity  . Alcohol use: No    Comment: occasional  . Drug use: No  . Sexual activity: Yes  Other Topics Concern  . Not on file  Social History Narrative  . Not on file   Social Determinants of Health   Financial Resource Strain:   . Difficulty of Paying Living Expenses: Not on file  Food Insecurity:   . Worried About Charity fundraiser in the Last Year: Not on file  . Ran Out of Food in the Last Year: Not on file  Transportation Needs:   . Lack of Transportation (Medical): Not on file  . Lack of Transportation (Non-Medical): Not on file  Physical Activity:   . Days of Exercise per Week: Not on file  . Minutes of Exercise per Session: Not on file  Stress:   . Feeling of Stress : Not on file  Social Connections:   . Frequency of Communication with Friends and Family: Not on file  . Frequency of  Social Gatherings with Friends and Family: Not on file  . Attends Religious Services: Not on file  . Active Member of Clubs or Organizations: Not on file  . Attends Archivist Meetings: Not on file  . Marital Status: Not on file  Intimate Partner Violence:   . Fear of Current or Ex-Partner: Not on file  . Emotionally Abused: Not on file  . Physically Abused: Not on file  . Sexually Abused: Not on file     BP (!) 146/74   Pulse 66   Ht 5\' 4"  (1.626 m)   Wt 182 lb 9.6 oz (82.8 kg)   SpO2 97%   BMI 31.34 kg/m   Physical Exam:  Well appearing NAD HEENT: Unremarkable Neck:  No JVD, no thyromegally Lymphatics:  No adenopathy Back:  No CVA tenderness Lungs:  Clear with no wheezes HEART:  Regular rate rhythm, no murmurs, no rubs, no clicks Abd:  soft, positive bowel sounds, no organomegally, no rebound, no guarding Ext:  2 plus pulses, no edema, no cyanosis, no  clubbing Skin:  No rashes no nodules Neuro:  CN II through XII intact, motor grossly intact  EKG - nsr  Assess/Plan: 1. Palpitations - these have resolved. 2. NSVT/AT - his symptoms have resolved. He will continue his current meds.  3. Sinus node dysfunction - his resting HR is good today. No indication for PPM. 4. HTN - his SBP is up a bit. He is encouraged to avoid salty foods and exercise.   Mikle Bosworth.D.

## 2019-10-17 DIAGNOSIS — C61 Malignant neoplasm of prostate: Secondary | ICD-10-CM | POA: Diagnosis not present

## 2019-10-22 DIAGNOSIS — N5201 Erectile dysfunction due to arterial insufficiency: Secondary | ICD-10-CM | POA: Diagnosis not present

## 2019-10-22 DIAGNOSIS — Z8546 Personal history of malignant neoplasm of prostate: Secondary | ICD-10-CM | POA: Diagnosis not present

## 2019-10-24 ENCOUNTER — Ambulatory Visit: Payer: PPO | Attending: Internal Medicine

## 2019-10-24 DIAGNOSIS — Z23 Encounter for immunization: Secondary | ICD-10-CM

## 2019-10-24 NOTE — Progress Notes (Signed)
   Covid-19 Vaccination Clinic  Name:  Kenneth Velez    MRN: [id number] DOB: 12/08/1950  10/24/2019  [name] was observed post Covid-19 immunization for 15 minutes without incident. He was provided with Vaccine Information Sheet and instruction to access the V-Safe system.   [name] was instructed to call 911 with any severe reactions post vaccine: [name] Kitchen Difficulty breathing  . Swelling of face and throat  . A fast heartbeat  . A bad rash all over body  . Dizziness and weakness   Immunizations Administered    Name Date Dose VIS Date Route   Pfizer COVID-19 Vaccine 10/24/2019  2:15 PM 0.3 mL 07/12/2019 Intramuscular   Manufacturer: [place]   Lot: [id number]   [place]: [id number]

## 2019-11-19 ENCOUNTER — Ambulatory Visit: Payer: PPO | Attending: Internal Medicine

## 2019-11-19 DIAGNOSIS — Z23 Encounter for immunization: Secondary | ICD-10-CM

## 2019-11-19 NOTE — Progress Notes (Signed)
   Covid-19 Vaccination Clinic  Name:  Kenneth Velez    MRN: [id number] DOB: [birth year]  11/19/2019  [name] was observed post Covid-19 immunization for 15 minutes without incident. He was provided with Vaccine Information Sheet and instruction to access the V-Safe system.   Mr. Staples was instructed to call 911 with any severe reactions post vaccine: [name] Kitchen Difficulty breathing  . Swelling of face and throat  . A fast heartbeat  . A bad rash all over body  . Dizziness and weakness   Immunizations Administered    Name Date Dose VIS Date Route   Pfizer COVID-19 Vaccine 11/19/2019  8:13 AM 0.3 mL 09/25/2018 Intramuscular   Manufacturer: [place]   Lot: [id number]   [place]: [id number]

## 2020-03-10 ENCOUNTER — Ambulatory Visit: Payer: PPO | Admitting: Emergency Medicine

## 2020-03-23 ENCOUNTER — Ambulatory Visit (INDEPENDENT_AMBULATORY_CARE_PROVIDER_SITE_OTHER): Payer: PPO | Admitting: Emergency Medicine

## 2020-03-23 ENCOUNTER — Other Ambulatory Visit: Payer: Self-pay

## 2020-03-23 ENCOUNTER — Encounter: Payer: Self-pay | Admitting: Emergency Medicine

## 2020-03-23 VITALS — BP 138/67 | HR 60 | Temp 98.0°F | Resp 16 | Ht 65.0 in | Wt 183.0 lb

## 2020-03-23 DIAGNOSIS — Z8546 Personal history of malignant neoplasm of prostate: Secondary | ICD-10-CM | POA: Diagnosis not present

## 2020-03-23 DIAGNOSIS — Z13228 Encounter for screening for other metabolic disorders: Secondary | ICD-10-CM

## 2020-03-23 DIAGNOSIS — Z13220 Encounter for screening for lipoid disorders: Secondary | ICD-10-CM

## 2020-03-23 DIAGNOSIS — Z0001 Encounter for general adult medical examination with abnormal findings: Secondary | ICD-10-CM | POA: Diagnosis not present

## 2020-03-23 DIAGNOSIS — Z130 Encounter for screening for diseases of the blood and blood-forming organs and certain disorders involving the immune mechanism: Secondary | ICD-10-CM

## 2020-03-23 DIAGNOSIS — Z1329 Encounter for screening for other suspected endocrine disorder: Secondary | ICD-10-CM | POA: Diagnosis not present

## 2020-03-23 DIAGNOSIS — R531 Weakness: Secondary | ICD-10-CM | POA: Diagnosis not present

## 2020-03-23 NOTE — Patient Instructions (Addendum)
   If you have lab work done today you will be contacted with your lab results within the next 2 weeks.  If you have not heard from us then please contact us. The fastest way to get your results is to register for My Chart.   IF you received an x-ray today, you will receive an invoice from [facility]. Please contact [facility] at [phone] with questions or concerns regarding your invoice.   IF you received labwork today, you will receive an invoice from [facility]. Please contact [facility] at [phone] with questions or concerns regarding your invoice.   Our billing staff will not be able to assist you with questions regarding bills from these companies.  You will be contacted with the lab results as soon as they are available. The fastest way to get your results is to activate your My Chart account. Instructions are located on the last page of this paperwork. If you have not heard from us regarding the results in 2 weeks, please contact this office.      Health Maintenance, Male Adopting a healthy lifestyle and getting preventive care are important in promoting health and wellness. Ask your health care provider about:  The right schedule for you to have regular tests and exams.  Things you can do on your own to prevent diseases and keep yourself healthy. What should I know about diet, weight, and exercise? Eat a healthy diet   Eat a diet that includes plenty of vegetables, fruits, low-fat dairy products, and lean protein.  Do not eat a lot of foods that are high in solid fats, added sugars, or sodium. Maintain a healthy weight Body mass index (BMI) is a measurement that can be used to identify possible weight problems. It estimates body fat based on height and weight. Your health care provider can help determine your BMI and help you achieve or maintain a healthy weight. Get regular exercise Get regular exercise. This is one of the most important things you  can do for your health. Most adults should:  Exercise for at least 150 minutes each week. The exercise should increase your heart rate and make you sweat (moderate-intensity exercise).  Do strengthening exercises at least twice a week. This is in addition to the moderate-intensity exercise.  Spend less time sitting. Even light physical activity can be beneficial. Watch cholesterol and blood lipids Have your blood tested for lipids and cholesterol at 68 years of age, then have this test every 5 years. You may need to have your cholesterol levels checked more often if:  Your lipid or cholesterol levels are high.  You are older than 68 years of age.  You are at high risk for heart disease. What should I know about cancer screening? Many types of cancers can be detected early and may often be prevented. Depending on your health history and family history, you may need to have cancer screening at various ages. This may include screening for:  Colorectal cancer.  Prostate cancer.  Skin cancer.  Lung cancer. What should I know about heart disease, diabetes, and high blood pressure? Blood pressure and heart disease  High blood pressure causes heart disease and increases the risk of stroke. This is more likely to develop in people who have high blood pressure readings, are of African descent, or are overweight.  Talk with your health care provider about your target blood pressure readings.  Have your blood pressure checked: ? Every 3-5 years if you are 18-39   years of age. ? Every year if you are 40 years old or older.  If you are between the ages of 65 and 75 and are a current or former smoker, ask your health care provider if you should have a one-time screening for abdominal aortic aneurysm (AAA). Diabetes Have regular diabetes screenings. This checks your fasting blood sugar level. Have the screening done:  Once every three years after age 45 if you are at a normal weight and have  a low risk for diabetes.  More often and at a younger age if you are overweight or have a high risk for diabetes. What should I know about preventing infection? Hepatitis B If you have a higher risk for hepatitis B, you should be screened for this virus. Talk with your health care provider to find out if you are at risk for hepatitis B infection. Hepatitis C Blood testing is recommended for:  Everyone born from 1945 through 1965.  Anyone with known risk factors for hepatitis C. Sexually transmitted infections (STIs)  You should be screened each year for STIs, including gonorrhea and chlamydia, if: ? You are sexually active and are younger than 68 years of age. ? You are older than 68 years of age and your health care provider tells you that you are at risk for this type of infection. ? Your sexual activity has changed since you were last screened, and you are at increased risk for chlamydia or gonorrhea. Ask your health care provider if you are at risk.  Ask your health care provider about whether you are at high risk for HIV. Your health care provider may recommend a prescription medicine to help prevent HIV infection. If you choose to take medicine to prevent HIV, you should first get tested for HIV. You should then be tested every 3 months for as long as you are taking the medicine. Follow these instructions at home: Lifestyle  Do not use any products that contain nicotine or tobacco, such as cigarettes, e-cigarettes, and chewing tobacco. If you need help quitting, ask your health care provider.  Do not use street drugs.  Do not share needles.  Ask your health care provider for help if you need support or information about quitting drugs. Alcohol use  Do not drink alcohol if your health care provider tells you not to drink.  If you drink alcohol: ? Limit how much you have to 0-2 drinks a day. ? Be aware of how much alcohol is in your drink. In the [place] one drink equals one 12  oz bottle of beer (355 mL), one 5 oz glass of wine (148 mL), or one 1 oz glass of hard liquor (44 mL). General instructions  Schedule regular health, dental, and eye exams.  Stay current with your vaccines.  Tell your health care provider if: ? You often feel depressed. ? You have ever been abused or do not feel safe at home. Summary  Adopting a healthy lifestyle and getting preventive care are important in promoting health and wellness.  Follow your health care provider's instructions about healthy diet, exercising, and getting tested or screened for diseases.  Follow your health care provider's instructions on monitoring your cholesterol and blood pressure. This information is not intended to replace advice given to you by your health care provider. Make sure you discuss any questions you have with your health care provider. Document Revised: 07/11/2018 Document Reviewed: 07/11/2018 Elsevier Patient Education  2020 Elsevier Inc.  

## 2020-03-23 NOTE — Progress Notes (Signed)
Kenneth Velez 69 y.o.   Chief Complaint  Patient presents with  . Annual Exam    HISTORY OF PRESENT ILLNESS: This is a 69 y.o. male here for annual exam. Seen by me only 1 time in the past October 2019. Problem list and medical history reviewed with patient. Has history of hypertension and tachyarrhythmias.  Presently on Cardizem and Tambocor. History of dyslipidemia on Lipitor 80 mg daily along with baby aspirin daily. Has history of prostate cancer.  States he is doing well. Today complaining of generalized weakness and daily tiredness for several months. No other complaints or medical concerns today. Fully vaccinated against Covid.  HPI   Prior to Admission medications   Medication Sig Start Date End Date Taking? Authorizing Provider  aspirin EC 81 MG tablet Take 1 tablet (81 mg total) by mouth daily. 02/25/19  Yes Turner, Eber Hong, MD  atorvastatin (LIPITOR) 80 MG tablet Take 1 tablet (80 mg total) by mouth daily. 07/10/19  Yes Imogene Burn, PA-C  diltiazem (CARDIZEM CD) 180 MG 24 hr capsule Take 1 capsule (180 mg total) by mouth daily. 07/10/19  Yes Imogene Burn, PA-C  flecainide (TAMBOCOR) 50 MG tablet Take 1 tablet (50 mg total) by mouth 2 (two) times daily. 08/22/19  Yes Evans Lance, MD  tadalafil (CIALIS) 20 MG tablet Take 20 mg by mouth every other day. 07/30/19  Yes [provider]    No Known Allergies  Patient Active Problem List   Diagnosis Date Noted  . Hypertension 10/08/2019  . Sinus node dysfunction ([place]) 10/08/2019  . Chest pain 09/07/2018  . SVT (supraventricular tachycardia) ([place]) 07/18/2018  . Wide-complex tachycardia ([place]) 07/18/2018  . Elevated glucose 07/18/2018  . Exertional angina ([facility]) 06/04/2018  . Heart palpitations 06/04/2018  . DOE (dyspnea on exertion) 05/25/2018  . Palpitations 05/25/2018  . Malignant neoplasm of prostate ([place]) 04/20/2015  . Osteoarthritis of left hip 09/21/2012    Past Medical History:    Diagnosis Date  . Arthritis   . Malignant neoplasm of prostate ([place]) 04/20/2015  . Osteoarthritis of left hip 09/21/2012  . Prostate cancer [facility])     Past Surgical History:  Procedure Laterality Date  . JOINT REPLACEMENT    . PROSTATE BIOPSY    . 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 Marital status: Married    Spouse name: Not on file  . Number of children: Not on file  . Years of education: Not on file  . Highest education level: Not on file  Occupational History  . Not on file  Tobacco Use  . Smoking status: Never Smoker  . Smokeless tobacco: Never Used  Vaping Use  . Vaping Use: Never used  Substance and Sexual Activity  . Alcohol use: No    Comment: occasional  . Drug use: No  . Sexual activity: Yes  Other Topics Concern  . Not on file  Social History Narrative  . Not on file   Social Determinants of Health   Financial Resource Strain:   . Difficulty of Paying Living Expenses: Not on file  Food Insecurity:   . Worried About Charity fundraiser in the Last Year: Not on file  . Ran Out of Food in the  Last Year: Not on file  Transportation Needs:   . Lack of Transportation (Medical): Not on file  . Lack of Transportation (Non-Medical): Not on file  Physical Activity:   . Days of Exercise per Week: Not on file  . Minutes of Exercise per Session: Not on file  Stress:   . Feeling of Stress : Not on file  Social Connections:   . Frequency of Communication with Friends and Family: Not on file  . Frequency of Social Gatherings with Friends and Family: Not on file  . Attends Religious Services: Not on file  . Active Member of Clubs or Organizations: Not on file  . Attends Archivist Meetings: Not on file  . Marital Status: Not  on file  Intimate Partner Violence:   . Fear of Current or Ex-Partner: Not on file  . Emotionally Abused: Not on file  . Physically Abused: Not on file  . Sexually Abused: Not on file    Family History  Problem Relation Age of Onset  . Diabetes Mother   . Colon cancer Father   . Pancreatic cancer Neg Hx   . Rectal cancer Neg Hx   . Stomach cancer Neg Hx   . Esophageal cancer Neg Hx   . Cancer Neg Hx      Review of Systems  Constitutional: Negative.  Negative for chills and fever.  HENT: Negative.  Negative for congestion and sore throat.   Eyes: Negative.   Respiratory: Negative.  Negative for cough and shortness of breath.   Cardiovascular: Negative.  Negative for chest pain and palpitations.  Gastrointestinal: Negative.  Negative for abdominal pain, diarrhea, nausea and vomiting.  Genitourinary: Negative for dysuria and hematuria.  Musculoskeletal: Negative.  Negative for myalgias and neck pain.  Skin: Negative.  Negative for rash.  Neurological: Negative.  Negative for dizziness and headaches.  All other systems reviewed and are negative.  Today's Vitals   03/23/20 1103  BP: 138/67  Pulse: 60  Resp: 16  Temp: 98 F (36.7 C)  TempSrc: Temporal  SpO2: 98%  Weight: 183 lb (83 kg)  Height: 5\' 5"  (1.651 m)   Body mass index is 30.45 kg/m.   Physical Exam Vitals reviewed.  Constitutional:      Appearance: Normal appearance.  HENT:     Head: Normocephalic.  Eyes:     Extraocular Movements: Extraocular movements intact.     Conjunctiva/sclera: Conjunctivae normal.     Pupils: Pupils are equal, round, and reactive to light.  Cardiovascular:     Rate and Rhythm: Normal rate and regular rhythm.     Pulses: Normal pulses.     Heart sounds: Normal heart sounds.  Pulmonary:     Effort: Pulmonary effort is normal.     Breath sounds: Normal breath sounds.  Abdominal:     General: Bowel sounds are normal. There is no distension.     Palpations: Abdomen is soft.       Tenderness: There is no abdominal tenderness. There is no right CVA tenderness, left CVA tenderness or rebound.  Musculoskeletal:        General: Normal range of motion.     Cervical back: Normal range of motion and neck supple.     Right lower leg: No edema.     Left lower leg: No edema.  Skin:    General: Skin is warm and dry.     Capillary Refill: Capillary refill takes less than 2 seconds.  Neurological:  General: No focal deficit present.     Mental Status: He is alert and oriented to person, place, and time.  Psychiatric:        Mood and Affect: Mood normal.        Behavior: Behavior normal.    EKG: Normal sinus rhythm with ventricular rate of 59/min.  No acute ischemic changes.  Normal EKG.  ASSESSMENT & PLAN: Erving was seen today for annual exam.  Diagnoses and all orders for this visit:  Encounter for general adult medical examination with abnormal findings  Screening for deficiency anemia -     CBC with Differential/Platelet  Screening for lipoid disorders -     Lipid panel  Screening for endocrine, metabolic and immunity disorder -     TSH -     Hemoglobin A1c  History of prostate cancer -     PSA  General weakness -     CBC with Differential/Platelet -     Comprehensive metabolic panel -     TSH -     Hemoglobin A1c -     TestT+TestF+SHBG    Patient Instructions       If you have lab work done today you will be contacted with your lab results within the next 2 weeks.  If you have not heard from [place] then please contact us. The fastest way to get your results is to register for My Chart.   IF you received an x-ray today, you will receive an invoice from [facility]. Please contact [facility] at [phone] with questions or concerns regarding your invoice.   IF you received labwork today, you will receive an invoice from [facility]. Please contact [facility] at [phone] with questions or concerns regarding your invoice.    Our billing staff will not be able to assist you with questions regarding bills from these companies.  You will be contacted with the lab results as soon as they are available. The fastest way to get your results is to activate your My Chart account. Instructions are located on the last page of this paperwork. If you have not heard from [place] regarding the results in 2 weeks, please contact this office.      Health Maintenance, Male Adopting a healthy lifestyle and getting preventive care are important in promoting health and wellness. Ask your health care provider about:  The right schedule for you to have regular tests and exams.  Things you can do on your own to prevent diseases and keep yourself healthy. What should I know about diet, weight, and exercise? Eat a healthy diet   Eat a diet that includes plenty of vegetables, fruits, low-fat dairy products, and lean protein.  Do not eat a lot of foods that are high in solid fats, added sugars, or sodium. Maintain a healthy weight Body mass index (BMI) is a measurement that can be used to identify possible weight problems. It estimates body fat based on height and weight. Your health care provider can help determine your BMI and help you achieve or maintain a healthy weight. Get regular exercise Get regular exercise. This is one of the most important things you can do for your health. Most adults should:  Exercise for at least 150 minutes each week. The exercise should increase your heart rate and make you sweat (moderate-intensity exercise).  Do strengthening exercises at least twice a week. This is in addition to the moderate-intensity exercise.  Spend less time sitting. Even light physical activity can be  beneficial. Watch cholesterol and blood lipids Have your blood tested for lipids and cholesterol at 68 years of age, then have this test every 5 years. You may need to have your cholesterol levels checked more often if:  Your  lipid or cholesterol levels are high.  You are older than 68 years of age.  You are at high risk for heart disease. What should I know about cancer screening? Many types of cancers can be detected early and may often be prevented. Depending on your health history and family history, you may need to have cancer screening at various ages. This may include screening for:  Colorectal cancer.  Prostate cancer.  Skin cancer.  Lung cancer. What should I know about heart disease, diabetes, and high blood pressure? Blood pressure and heart disease  High blood pressure causes heart disease and increases the risk of stroke. This is more likely to develop in people who have high blood pressure readings, are of African descent, or are overweight.  Talk with your health care provider about your target blood pressure readings.  Have your blood pressure checked: ? Every 3-5 years if you are 47-81 years of age. ? Every year if you are 77 years old or older.  If you are between the ages of 60 and 6 and are a current or former smoker, ask your health care provider if you should have a one-time screening for abdominal aortic aneurysm (AAA). Diabetes Have regular diabetes screenings. This checks your fasting blood sugar level. Have the screening done:  Once every three years after age 79 if you are at a normal weight and have a low risk for diabetes.  More often and at a younger age if you are overweight or have a high risk for diabetes. What should I know about preventing infection? Hepatitis B If you have a higher risk for hepatitis B, you should be screened for this virus. Talk with your health care provider to find out if you are at risk for hepatitis B infection. Hepatitis C Blood testing is recommended for:  Everyone born from [birth year] through 1965.  Anyone with known risk factors for hepatitis C. Sexually transmitted infections (STIs)  You should be screened each year for STIs, including  gonorrhea and chlamydia, if: ? You are sexually active and are younger than 68 years of age. ? You are older than 68 years of age and your health care provider tells you that you are at risk for this type of infection. ? Your sexual activity has changed since you were last screened, and you are at increased risk for chlamydia or gonorrhea. Ask your health care provider if you are at risk.  Ask your health care provider about whether you are at high risk for HIV. Your health care provider may recommend a prescription medicine to help prevent HIV infection. If you choose to take medicine to prevent HIV, you should first get tested for HIV. You should then be tested every 3 months for as long as you are taking the medicine. Follow these instructions at home: Lifestyle  Do not use any products that contain nicotine or tobacco, such as cigarettes, e-cigarettes, and chewing tobacco. If you need help quitting, ask your health care provider.  Do not use street drugs.  Do not share needles.  Ask your health care provider for help if you need support or information about quitting drugs. Alcohol use  Do not drink alcohol if your health care provider tells you not to drink.  If you drink alcohol: ? Limit how much you have to 0-2 drinks a day. ? Be aware of how much alcohol is in your drink. In the [place] one drink equals one 12 oz bottle of beer (355 mL), one 5 oz glass of wine (148 mL), or one 1 oz glass of hard liquor (44 mL). General instructions  Schedule regular health, dental, and eye exams.  Stay current with your vaccines.  Tell your health care provider if: ? You often feel depressed. ? You have ever been abused or do not feel safe at home. Summary  Adopting a healthy lifestyle and getting preventive care are important in promoting health and wellness.  Follow your health care provider's instructions about healthy diet, exercising, and getting tested or screened for  diseases.  Follow your health care provider's instructions on monitoring your cholesterol and blood pressure. This information is not intended to replace advice given to you by your health care provider. Make sure you discuss any questions you have with your health care provider. Document Revised: 07/11/2018 Document Reviewed: 07/11/2018 Elsevier Patient Education  2020 Elsevier Inc.      Agustina Caroli, MD [facility]

## 2020-03-24 ENCOUNTER — Other Ambulatory Visit: Payer: Self-pay | Admitting: Emergency Medicine

## 2020-03-24 DIAGNOSIS — E1165 Type 2 diabetes mellitus with hyperglycemia: Secondary | ICD-10-CM

## 2020-03-24 MED ORDER — JANUMET 50-500 MG PO TABS: 1.0000 | ORAL_TABLET | Freq: Two times a day (BID) | ORAL | 3 refills | Status: DC

## 2020-03-27 LAB — CBC WITH DIFFERENTIAL/PLATELET
Basophils Absolute: 0 10*3/uL (ref 0.0–0.2)
Basos: 1 %
EOS (ABSOLUTE): 0.1 10*3/uL (ref 0.0–0.4)
Eos: 2 %
Hematocrit: 41 % (ref 37.5–51.0)
Hemoglobin: 13.6 g/dL (ref 13.0–17.7)
Immature Grans (Abs): 0 10*3/uL (ref 0.0–0.1)
Immature Granulocytes: 0 %
Lymphocytes Absolute: 1.7 10*3/uL (ref 0.7–3.1)
Lymphs: 27 %
MCH: 30.6 pg (ref 26.6–33.0)
MCHC: 33.2 g/dL (ref 31.5–35.7)
MCV: 92 fL (ref 79–97)
Monocytes Absolute: 0.6 10*3/uL (ref 0.1–0.9)
Monocytes: 9 %
Neutrophils Absolute: 4 10*3/uL (ref 1.4–7.0)
Neutrophils: 61 %
Platelets: 243 10*3/uL (ref 150–450)
RBC: 4.45 x10E6/uL (ref 4.14–5.80)
RDW: 12.3 % (ref 11.6–15.4)
WBC: 6.3 10*3/uL (ref 3.4–10.8)

## 2020-03-27 LAB — PSA: Prostate Specific Ag, Serum: <0.1 ng/mL (ref 0.0–4.0)

## 2020-03-27 LAB — HEMOGLOBIN A1C
Est. average glucose Bld gHb Est-mCnc: 235 mg/dL
Hgb A1c MFr Bld: 9.8 % — ABNORMAL HIGH (ref 4.8–5.6)

## 2020-03-27 LAB — COMPREHENSIVE METABOLIC PANEL
ALT: 34 IU/L (ref 0–44)
AST: 31 IU/L (ref 0–40)
Albumin/Globulin Ratio: 1.8 (ref 1.2–2.2)
Albumin: 4.4 g/dL (ref 3.8–4.8)
Alkaline Phosphatase: 74 IU/L (ref 48–121)
BUN/Creatinine Ratio: 20 (ref 10–24)
BUN: 16 mg/dL (ref 8–27)
Bilirubin Total: 0.3 mg/dL (ref 0.0–1.2)
CO2: 21 mmol/L (ref 20–29)
Calcium: 9.6 mg/dL (ref 8.6–10.2)
Chloride: 105 mmol/L (ref 96–106)
Creatinine, Ser: 0.81 mg/dL (ref 0.76–1.27)
GFR calc Af Amer: 106 mL/min/{1.73_m2} (ref >59)
GFR calc non Af Amer: 91 mL/min/{1.73_m2} (ref >59)
Globulin, Total: 2.4 g/dL (ref 1.5–4.5)
Glucose: 258 mg/dL — ABNORMAL HIGH (ref 65–99)
Potassium: 4.4 mmol/L (ref 3.5–5.2)
Sodium: 140 mmol/L (ref 134–144)
Total Protein: 6.8 g/dL (ref 6.0–8.5)

## 2020-03-27 LAB — TESTT+TESTF+SHBG
Sex Hormone Binding: 36.5 nmol/L (ref 19.3–76.4)
Testosterone, Free: 7.7 pg/mL (ref 6.6–18.1)
Testosterone, Total, LC/MS: 421.7 ng/dL (ref 264.0–916.0)

## 2020-03-27 LAB — LIPID PANEL
Chol/HDL Ratio: 2.8 ratio (ref 0.0–5.0)
Cholesterol, Total: 131 mg/dL (ref 100–199)
HDL: 47 mg/dL (ref >39)
LDL Chol Calc (NIH): 71 mg/dL (ref 0–99)
Triglycerides: 64 mg/dL (ref 0–149)
VLDL Cholesterol Cal: 13 mg/dL (ref 5–40)

## 2020-03-27 LAB — TSH: TSH: 2.26 u[IU]/mL (ref 0.450–4.500)

## 2020-04-14 NOTE — Progress Notes (Signed)
Cardiology Office Note:    Date:  04/16/2020   ID:  Kenneth Velez, DOB Aug 28, 1950, MRN [id number]  PCP:  Kenneth Pollen, MD  Cardiologist:  Kenneth Him, MD    Referring MD: Kenneth Velez, *   Chief Complaint  Patient presents with  . Coronary Artery Disease  . Hypertension  . Hyperlipidemia    History of Present Illness:    Kenneth Velez is a 69 y.o. male with a hx of mild ASCAD with coronary CTA showing mild CAD in the prox to mid RCA 25-49% and minimal calcified plaque in the ostial and mid LAD and prox LCX. Calcium score was 23.  FFR showed no flow limiting lesion. 2D echo for CPA and SOB showed normal LVEF 60 to 65% with mild LVH and grade 1 DD, nuclear stress test normal, 24-hour monitor showed sinus bradycardia, normal sinus rhythm and sinus tachycardia average heart rate 61 bpm range 41 to 231 bpmandSVT up to 7 beats at a time, frequent PACs and wide-complex tachycardia up to 11 beats with bigeminal PVCswithPVC load less than 1%.He wasplaced on Toprol XL 50 mg in the morning 25mg in the afternoon.  He continued to have atypical sharp stabbing CP in the back despite coronary CTA showing  mild CAD in the proximal to mid RCA 25 to 49% and minimal calcified plaque in the ostial and mid LAD and proximal circumflex.  Calcium score 23.  FFR no flow-limiting lesions.  CP was very atypical and felt not cardiac in origin.  He was referred to Pulmonary for evaluation which was unremarkable. His cardiopulmonary stress test showed that he may not be able to increase HR with exercise which may be attributing to his SOB. He was seen back by Estella Husk, PA in 07/2019 and he was complaining of continued SOB and CP.  His sx were felt to possibly be due to weight and anxiety.  He was also complaining severe fatigue and his Bystolic was stopped and started on Diltiazem for palpitations.  He was seen by Cristopher Peru, MD 08/22/2019 for ongoing palpitations.  His heart  monitor showed PVCs, NSVT and multiple atrial tachycardias.  He was started on Flecainide.  He is here today for followup and is doing well.  he denies any chest pain or pressure, SOB, DOE, PND, orthopnea, LE edema, dizziness, palpitations or syncope. He still complaints of significant fatigue that was helped a little by stopping his BB.  He is compliant with his meds and is tolerating meds with no SE.    Past Medical History:  Diagnosis Date  . Arthritis   . Malignant neoplasm of prostate ([place]) 04/20/2015  . Osteoarthritis of left hip 09/21/2012  . Prostate cancer [facility])     Past Surgical History:  Procedure Laterality Date  . JOINT REPLACEMENT    . PROSTATE BIOPSY    . 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 TOTAL HIP ARTHROPLASTY Left 09/21/2012   Procedure: TOTAL HIP ARTHROPLASTY;  Surgeon: Yvette Rack., MD;  Location: [place];  Service: Orthopedics;  Laterality: Left;    Current Medications: Current Meds  Medication Sig  . aspirin EC 81 MG tablet Take 1 tablet (81 mg total) by mouth daily.  [name] Kitchen atorvastatin (LIPITOR) 80 MG tablet Take 1 tablet (80 mg total) by mouth daily.  [name] Kitchen diltiazem (CARDIZEM CD) 180 MG 24 hr capsule Take 1 capsule (180 mg total) by mouth daily.  . flecainide (TAMBOCOR) 50 MG tablet Take  1 tablet (50 mg total) by mouth 2 (two) times daily.  . sitaGLIPtin-metformin (JANUMET) 50-500 MG tablet Take 1 tablet by mouth 2 (two) times daily with a meal.  . tadalafil (CIALIS) 20 MG tablet Take 20 mg by mouth every other day.     Allergies:   Patient has no known allergies.   Social History   Socioeconomic History  . Marital status: Married    Spouse name: Not on file  . Number of children: Not on file  . Years of education: Not on file  . Highest education level: Not on file  Occupational History  . Not on file  Tobacco Use  . Smoking status: Never Smoker  . Smokeless tobacco: Never Used  Vaping Use  . Vaping Use: Never used  Substance and  Sexual Activity  . Alcohol use: No    Comment: occasional  . Drug use: No  . Sexual activity: Yes  Other Topics Concern  . Not on file  Social History Narrative  . Not on file   Social Determinants of Health   Financial Resource Strain:   . Difficulty of Paying Living Expenses: Not on file  Food Insecurity:   . Worried About Charity fundraiser in the Last Year: Not on file  . Ran Out of Food in the Last Year: Not on file  Transportation Needs:   . Lack of Transportation (Medical): Not on file  . Lack of Transportation (Non-Medical): Not on file  Physical Activity:   . Days of Exercise per Week: Not on file  . Minutes of Exercise per Session: Not on file  Stress:   . Feeling of Stress : Not on file  Social Connections:   . Frequency of Communication with Friends and Family: Not on file  . Frequency of Social Gatherings with Friends and Family: Not on file  . Attends Religious Services: Not on file  . Active Member of Clubs or Organizations: Not on file  . Attends Archivist Meetings: Not on file  . Marital Status: Not on file     Family History: The patient's family history includes Colon cancer in his father; Diabetes in his mother. There is no history of Pancreatic cancer, Rectal cancer, Stomach cancer, Esophageal cancer, or Cancer.  ROS:   Please see the history of present illness.    ROS  All other systems reviewed and negative.   EKGs/Labs/Other Studies Reviewed:    The following studies were reviewed today: EKG. Nuclear stress test, coronary CTA< 2D echo  EKG:  EKG is  ordered today and showed NSR with normal intervals  Recent Labs: 04/30/2019: NT-Pro BNP 143 03/23/2020: ALT 34; BUN 16; Creatinine, Ser 0.81; Hemoglobin 13.6; Platelets 243; Potassium 4.4; Sodium 140; TSH 2.260   Recent Lipid Panel    Component Value Date/Time   CHOL 131 03/23/2020 1245   TRIG 64 03/23/2020 1245   HDL 47 03/23/2020 1245   CHOLHDL 2.8 03/23/2020 1245   CHOLHDL  4.6 09/29/2013 1044   VLDL 29 09/29/2013 1044   LDLCALC 71 03/23/2020 1245    Physical Exam:    VS:  BP (!) 142/72   Pulse 60   Ht 5\' 5"  (1.651 m)   Wt 181 lb (82.1 kg)   BMI 30.12 kg/m     Wt Readings from Last 3 Encounters:  04/16/20 181 lb (82.1 kg)  03/23/20 183 lb (83 kg)  10/08/19 182 lb 9.6 oz (82.8 kg)     GGEN: Well nourished,  well developed in no acute distress HEENT: Normal NECK: No JVD; No carotid bruits LYMPHATICS: No lymphadenopathy CARDIAC:RRR, no murmurs, rubs, gallops RESPIRATORY:  Clear to auscultation without rales, wheezing or rhonchi  ABDOMEN: Soft, non-tender, non-distended MUSCULOSKELETAL:  No edema; No deformity  SKIN: Warm and dry NEUROLOGIC:  Alert and oriented x 3 PSYCHIATRIC:  Normal affect    ASSESSMENT:    1. Coronary artery disease of native artery of native heart with stable angina pectoris ([place])   2. Essential hypertension   3. Hyperlipidemia LDL goal <70   4. SVT (supraventricular tachycardia) ([place])   5. Sinus node dysfunction ([facility])    PLAN:    In order of problems listed above:  1.  ASCAD -coronary CTA showed mild CAD in the prox to mid RCA 25-49% and minimal calcified plaque in the ostial and mid LAD and prox LCX. Calcium score was 23.  FFR showed no flow limiting lesion -he has continued chronic atypical CP that is sharp in his back and chest and not related to exertion with constant chest heaviness -  likely MSK with a high anxiety component was well.   -he has not had any CP since I saw Velez last -ETT in Feb 2021 normal -Cardiopulmonary stress test showed normal functional capacity with no cardiopulmonary limitation but did have a significant chronotropic incompetence and hypertensive response to exercise -Referred to pulmonary with negative workup -BB changed to CCB and started on  Flecainide for PVCs and PACS with resolution of his SOB -continue ASA, statin and CCB  2.  HTN -BP borderline controlled -continue Cardizem  CD 180mg  daily -I have asked Velez to check his BP daily for a week and call with results.   3.  HLD -LDL goal < 70 -LDL was 71 in Aug 2021 -continue Atorvastatin 80mg  daily  4.  SVT -occasionally has some palpitations but seem short lived -continue Cardizem CD 180mg  daily and Flecainide 50mg  BID -ETT in 09/2019 with no exercise induced arrhythmias  5.  Sinus node dysfunction -chronotropic incompetence on CPTX -no indication per EP for PPM at this time   Medication Adjustments/Labs and Tests Ordered: Current medicines are reviewed at length with the patient today.  Concerns regarding medicines are outlined above.  No orders of the defined types were placed in this encounter.  No orders of the defined types were placed in this encounter.   Signed, Kenneth Him, MD  04/16/2020 10:06 AM    [place]

## 2020-04-16 ENCOUNTER — Encounter: Payer: Self-pay | Admitting: Cardiology

## 2020-04-16 ENCOUNTER — Ambulatory Visit (INDEPENDENT_AMBULATORY_CARE_PROVIDER_SITE_OTHER): Payer: PPO | Admitting: Cardiology

## 2020-04-16 ENCOUNTER — Other Ambulatory Visit: Payer: Self-pay

## 2020-04-16 VITALS — BP 142/72 | HR 60 | Ht 65.0 in | Wt 181.0 lb

## 2020-04-16 DIAGNOSIS — E785 Hyperlipidemia, unspecified: Secondary | ICD-10-CM | POA: Diagnosis not present

## 2020-04-16 DIAGNOSIS — I25118 Atherosclerotic heart disease of native coronary artery with other forms of angina pectoris: Secondary | ICD-10-CM

## 2020-04-16 DIAGNOSIS — I10 Essential (primary) hypertension: Secondary | ICD-10-CM

## 2020-04-16 DIAGNOSIS — I495 Sick sinus syndrome: Secondary | ICD-10-CM

## 2020-04-16 DIAGNOSIS — I471 Supraventricular tachycardia: Secondary | ICD-10-CM | POA: Diagnosis not present

## 2020-04-16 NOTE — Patient Instructions (Signed)
Please check your blood pressure daily for one week and call us or send a MyChart message with your readings.   Medication Instructions:  Your physician recommends that you continue on your current medications as directed. Please refer to the Current Medication list given to you today.  *If you need a refill on your cardiac medications before your next appointment, please call your pharmacy*  Follow-Up: At [facility], you and your health needs are our priority.  As part of our continuing mission to provide you with exceptional heart care, we have created designated Provider Care Teams.  These Care Teams include your primary Cardiologist (physician) and Advanced Practice Providers (APPs -  Physician Assistants and Nurse Practitioners) who all work together to provide you with the care you need, when you need it.  Your next appointment:   1 year(s)  The format for your next appointment:   In Person  Provider:   You may see Fransico Him, MD or one of the following Advanced Practice Providers on your designated Care Team:    Melina Copa, PA-C  Ermalinda Barrios, PA-C

## 2020-04-20 DIAGNOSIS — C61 Malignant neoplasm of prostate: Secondary | ICD-10-CM | POA: Diagnosis not present

## 2020-06-07 ENCOUNTER — Other Ambulatory Visit: Payer: Self-pay | Admitting: Cardiology

## 2020-06-15 ENCOUNTER — Other Ambulatory Visit: Payer: Self-pay | Admitting: Physician Assistant

## 2020-06-22 ENCOUNTER — Ambulatory Visit: Payer: PPO | Admitting: Emergency Medicine

## 2020-06-23 ENCOUNTER — Other Ambulatory Visit: Payer: Self-pay

## 2020-06-23 ENCOUNTER — Encounter: Payer: Self-pay | Admitting: Emergency Medicine

## 2020-06-23 ENCOUNTER — Ambulatory Visit (INDEPENDENT_AMBULATORY_CARE_PROVIDER_SITE_OTHER): Payer: PPO | Admitting: Emergency Medicine

## 2020-06-23 VITALS — BP 127/58 | HR 48 | Temp 97.3°F | Resp 16 | Ht 65.0 in | Wt 177.0 lb

## 2020-06-23 DIAGNOSIS — E1165 Type 2 diabetes mellitus with hyperglycemia: Secondary | ICD-10-CM

## 2020-06-23 DIAGNOSIS — I152 Hypertension secondary to endocrine disorders: Secondary | ICD-10-CM | POA: Diagnosis not present

## 2020-06-23 DIAGNOSIS — J849 Interstitial pulmonary disease, unspecified: Secondary | ICD-10-CM | POA: Diagnosis not present

## 2020-06-23 DIAGNOSIS — E1159 Type 2 diabetes mellitus with other circulatory complications: Secondary | ICD-10-CM

## 2020-06-23 DIAGNOSIS — C61 Malignant neoplasm of prostate: Secondary | ICD-10-CM | POA: Diagnosis not present

## 2020-06-23 DIAGNOSIS — I495 Sick sinus syndrome: Secondary | ICD-10-CM | POA: Diagnosis not present

## 2020-06-23 DIAGNOSIS — I10 Essential (primary) hypertension: Secondary | ICD-10-CM

## 2020-06-23 DIAGNOSIS — E1169 Type 2 diabetes mellitus with other specified complication: Secondary | ICD-10-CM | POA: Insufficient documentation

## 2020-06-23 LAB — POCT GLYCOSYLATED HEMOGLOBIN (HGB A1C): Hemoglobin A1C: 6.9 % — AB (ref 4.0–5.6)

## 2020-06-23 LAB — GLUCOSE, POCT (MANUAL RESULT ENTRY): POC Glucose: 107 mg/dl — AB (ref 70–99)

## 2020-06-23 NOTE — Assessment & Plan Note (Signed)
Much improved diabetes with hemoglobin A1c at goal 6.9. Continue Janumet.  Diet and nutrition discussed. Continue statin therapy with Lipitor 80 mg daily. Continue daily baby aspirin. Follow-up in 6 months.

## 2020-06-23 NOTE — Patient Instructions (Addendum)
   If you have lab work done today you will be contacted with your lab results within the next 2 weeks.  If you have not heard from us then please contact us. The fastest way to get your results is to register for My Chart.   IF you received an x-ray today, you will receive an invoice from [facility]. Please contact  Radiology at [phone] with questions or concerns regarding your invoice.   IF you received labwork today, you will receive an invoice from [facility]. Please contact [facility] at [phone] with questions or concerns regarding your invoice.   Our billing staff will not be able to assist you with questions regarding bills from these companies.  You will be contacted with the lab results as soon as they are available. The fastest way to get your results is to activate your My Chart account. Instructions are located on the last page of this paperwork. If you have not heard from us regarding the results in 2 weeks, please contact this office.     Diabetes Mellitus and Nutrition, Adult When you have diabetes (diabetes mellitus), it is very important to have healthy eating habits because your blood sugar (glucose) levels are greatly affected by what you eat and drink. Eating healthy foods in the appropriate amounts, at about the same times every day, can help you:  Control your blood glucose.  Lower your risk of heart disease.  Improve your blood pressure.  Reach or maintain a healthy weight. Every person with diabetes is different, and each person has different needs for a meal plan. Your health care provider may recommend that you work with a diet and nutrition specialist (dietitian) to make a meal plan that is best for you. Your meal plan may vary depending on factors such as:  The calories you need.  The medicines you take.  Your weight.  Your blood glucose, blood pressure, and cholesterol levels.  Your activity level.  Other health conditions  you have, such as heart or kidney disease. How do carbohydrates affect me? Carbohydrates, also called carbs, affect your blood glucose level more than any other type of food. Eating carbs naturally raises the amount of glucose in your blood. Carb counting is a method for keeping track of how many carbs you eat. Counting carbs is important to keep your blood glucose at a healthy level, especially if you use insulin or take certain oral diabetes medicines. It is important to know how many carbs you can safely have in each meal. This is different for every person. Your dietitian can help you calculate how many carbs you should have at each meal and for each snack. Foods that contain carbs include:  Bread, cereal, rice, pasta, and crackers.  Potatoes and corn.  Peas, beans, and lentils.  Milk and yogurt.  Fruit and juice.  Desserts, such as cakes, cookies, ice cream, and candy. How does alcohol affect me? Alcohol can cause a sudden decrease in blood glucose (hypoglycemia), especially if you use insulin or take certain oral diabetes medicines. Hypoglycemia can be a life-threatening condition. Symptoms of hypoglycemia (sleepiness, dizziness, and confusion) are similar to symptoms of having too much alcohol. If your health care provider says that alcohol is safe for you, follow these guidelines:  Limit alcohol intake to no more than 1 drink per day for nonpregnant women and 2 drinks per day for men. One drink equals 12 oz of beer, 5 oz of wine, or 1 oz of hard liquor.    Do not drink on an empty stomach.  Keep yourself hydrated with water, diet soda, or unsweetened iced tea.  Keep in mind that regular soda, juice, and other mixers may contain a lot of sugar and must be counted as carbs. What are tips for following this plan?  Reading food labels  Start by checking the serving size on the "Nutrition Facts" label of packaged foods and drinks. The amount of calories, carbs, fats, and other  nutrients listed on the label is based on one serving of the item. Many items contain more than one serving per package.  Check the total grams (g) of carbs in one serving. You can calculate the number of servings of carbs in one serving by dividing the total carbs by 15. For example, if a food has 30 g of total carbs, it would be equal to 2 servings of carbs.  Check the number of grams (g) of saturated and trans fats in one serving. Choose foods that have low or no amount of these fats.  Check the number of milligrams (mg) of salt (sodium) in one serving. Most people should limit total sodium intake to less than 2,300 mg per day.  Always check the nutrition information of foods labeled as "low-fat" or "nonfat". These foods may be higher in added sugar or refined carbs and should be avoided.  Talk to your dietitian to identify your daily goals for nutrients listed on the label. Shopping  Avoid buying canned, premade, or processed foods. These foods tend to be high in fat, sodium, and added sugar.  Shop around the outside edge of the grocery store. This includes fresh fruits and vegetables, bulk grains, fresh meats, and fresh dairy. Cooking  Use low-heat cooking methods, such as baking, instead of high-heat cooking methods like deep frying.  Cook using healthy oils, such as olive, canola, or sunflower oil.  Avoid cooking with butter, cream, or high-fat meats. Meal planning  Eat meals and snacks regularly, preferably at the same times every day. Avoid going long periods of time without eating.  Eat foods high in fiber, such as fresh fruits, vegetables, beans, and whole grains. Talk to your dietitian about how many servings of carbs you can eat at each meal.  Eat 4-6 ounces (oz) of lean protein each day, such as lean meat, chicken, fish, eggs, or tofu. One oz of lean protein is equal to: ? 1 oz of meat, chicken, or fish. ? 1 egg. ?  cup of tofu.  Eat some foods each day that contain  healthy fats, such as avocado, nuts, seeds, and fish. Lifestyle  Check your blood glucose regularly.  Exercise regularly as told by your health care provider. This may include: ? 150 minutes of moderate-intensity or vigorous-intensity exercise each week. This could be brisk walking, biking, or water aerobics. ? Stretching and doing strength exercises, such as yoga or weightlifting, at least 2 times a week.  Take medicines as told by your health care provider.  Do not use any products that contain nicotine or tobacco, such as cigarettes and e-cigarettes. If you need help quitting, ask your health care provider.  Work with a counselor or diabetes educator to identify strategies to manage stress and any emotional and social challenges. Questions to ask a health care provider  Do I need to meet with a diabetes educator?  Do I need to meet with a dietitian?  What number can I call if I have questions?  When are the best times to   times to check my blood glucose? Where to find more information:  American Diabetes Association: diabetes.org  Academy of Nutrition and Dietetics: www.eatright.org  National Institute of Diabetes and Digestive and Kidney Diseases (NIH): www.niddk.nih.gov Summary  A healthy meal plan will help you control your blood glucose and maintain a healthy lifestyle.  Working with a diet and nutrition specialist (dietitian) can help you make a meal plan that is best for you.  Keep in mind that carbohydrates (carbs) and alcohol have immediate effects on your blood glucose levels. It is important to count carbs and to use alcohol carefully. This information is not intended to replace advice given to you by your health care provider. Make sure you discuss any questions you have with your health care provider. Document Revised: 06/30/2017 Document Reviewed: 08/22/2016 Elsevier Patient Education  2020 [employer]  

## 2020-06-23 NOTE — Progress Notes (Signed)
Kenneth Velez 69 y.o.   Chief Complaint  Patient presents with  . Diabetes    follow up 3 month    HISTORY OF PRESENT ILLNESS: This is a 69 y.o. male with history of diabetes here for 23-month follow-up. Presently on Janumet 50-500 mg twice a day. Feels better.  Has no complaints or medical concerns today. History of sinus node dysfunction with episodes of SVT.  On medications diltiazem and Tambocor. Fully vaccinated against Covid. Lab Results  Component Value Date   HGBA1C 9.8 (H) 03/23/2020   BP Readings from Last 3 Encounters:  06/23/20 (!) 127/58  04/16/20 (!) 142/72  03/23/20 138/67     HPI   Prior to Admission medications   Medication Sig Start Date End Date Taking? Authorizing Provider  aspirin EC 81 MG tablet Take 1 tablet (81 mg total) by mouth daily. 02/25/19   Sueanne Margarita, MD  atorvastatin (LIPITOR) 80 MG tablet TAKE 1 TABLET(80 MG) BY MOUTH DAILY 06/15/20   Imogene Burn, PA-C  diltiazem (CARDIZEM CD) 180 MG 24 hr capsule TAKE 1 CAPSULE(180 MG) BY MOUTH DAILY 06/15/20   Imogene Burn, PA-C  flecainide (TAMBOCOR) 50 MG tablet Take 1 tablet (50 mg total) by mouth 2 (two) times daily. 08/22/19   Evans Lance, MD  sitaGLIPtin-metformin (JANUMET) 50-500 MG tablet Take 1 tablet by mouth 2 (two) times daily with a meal. 03/24/20 06/22/20  Tuwanda Vokes, Ines Bloomer, MD  tadalafil (CIALIS) 20 MG tablet Take 20 mg by mouth every other day. 07/30/19   [provider]    No Known Allergies  Patient Active Problem List   Diagnosis Date Noted  . Type 2 diabetes mellitus with hyperglycemia, without long-term current use of insulin ([place]) 06/23/2020  . Hypertension 10/08/2019  . Sinus node dysfunction ([place]) 10/08/2019  . SVT (supraventricular tachycardia) ([place]) 07/18/2018  . Wide-complex tachycardia ([place]) 07/18/2018  . Exertional angina ([facility]) 06/04/2018  . Malignant neoplasm of prostate ([place]) 04/20/2015  . Osteoarthritis of left hip 09/21/2012     Past Medical History:  Diagnosis Date  . Arthritis   . Malignant neoplasm of prostate ([place]) 04/20/2015  . Osteoarthritis of left hip 09/21/2012  . Prostate cancer [facility])     Past Surgical History:  Procedure Laterality Date  . JOINT REPLACEMENT    . PROSTATE BIOPSY    . 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 TOTAL HIP ARTHROPLASTY Left 09/21/2012   Procedure: TOTAL HIP ARTHROPLASTY;  Surgeon: Yvette Rack., MD;  Location: [place];  Service: Orthopedics;  Laterality: Left;    Social History   Socioeconomic History  . Marital status: Married    Spouse name: Not on file  . Number of children: Not on file  . Years of education: Not on file  . Highest education level: Not on file  Occupational History  . Not on file  Tobacco Use  . Smoking status: Never Smoker  . Smokeless tobacco: Never Used  Vaping Use  . Vaping Use: Never used  Substance and Sexual Activity  . Alcohol use: No    Comment: occasional  . Drug use: No  . Sexual activity: Yes  Other Topics Concern  . Not on file  Social History Narrative  . Not on file   Social Determinants of Health   Financial Resource Strain:   . Difficulty of Paying Living Expenses: Not on file  Food Insecurity:   . Worried About Charity fundraiser in  the Last Year: Not on file  . Ran Out of Food in the Last Year: Not on file  Transportation Needs:   . Lack of Transportation (Medical): Not on file  . Lack of Transportation (Non-Medical): Not on file  Physical Activity:   . Days of Exercise per Week: Not on file  . Minutes of Exercise per Session: Not on file  Stress:   . Feeling of Stress : Not on file  Social Connections:   . Frequency of Communication with Friends and Family: Not on file  . Frequency of Social Gatherings with Friends and Family: Not on file  . Attends Religious Services: Not on file  . Active Member of Clubs or Organizations: Not on file  . Attends Archivist Meetings: Not on  file  . Marital Status: Not on file  Intimate Partner Violence:   . Fear of Current or Ex-Partner: Not on file  . Emotionally Abused: Not on file  . Physically Abused: Not on file  . Sexually Abused: Not on file    Family History  Problem Relation Age of Onset  . Diabetes Mother   . Colon cancer Father   . Pancreatic cancer Neg Hx   . Rectal cancer Neg Hx   . Stomach cancer Neg Hx   . Esophageal cancer Neg Hx   . Cancer Neg Hx      Review of Systems  Constitutional: Negative.  Negative for chills and fever.  HENT: Negative.  Negative for congestion and sore throat.   Respiratory: Negative.  Negative for cough and shortness of breath.   Cardiovascular: Negative.  Negative for chest pain and palpitations.  Gastrointestinal: Negative.  Negative for abdominal pain, diarrhea, nausea and vomiting.  Genitourinary: Negative.  Negative for dysuria and hematuria.  Musculoskeletal: Negative.   Skin: Negative.  Negative for rash.  Neurological: Negative for dizziness and headaches.  All other systems reviewed and are negative.  Today's Vitals   06/23/20 1506  BP: (!) 127/58  Pulse: (!) 48  Resp: 16  Temp: (!) 97.3 F (36.3 C)  TempSrc: Temporal  SpO2: 97%  Weight: 177 lb (80.3 kg)  Height: 5\' 5"  (1.651 m)   Body mass index is 29.45 kg/m. Wt Readings from Last 3 Encounters:  06/23/20 177 lb (80.3 kg)  04/16/20 181 lb (82.1 kg)  03/23/20 183 lb (83 kg)     Physical Exam Vitals reviewed.  Constitutional:      Appearance: Normal appearance.  HENT:     Head: Normocephalic.  Eyes:     Extraocular Movements: Extraocular movements intact.     Pupils: Pupils are equal, round, and reactive to light.  Cardiovascular:     Rate and Rhythm: Normal rate and regular rhythm.     Pulses: Normal pulses.     Heart sounds: Normal heart sounds.  Pulmonary:     Effort: Pulmonary effort is normal.     Breath sounds: Normal breath sounds.  Musculoskeletal:        General: Normal  range of motion.     Cervical back: Normal range of motion and neck supple.  Skin:    General: Skin is warm and dry.     Capillary Refill: Capillary refill takes less than 2 seconds.  Neurological:     General: No focal deficit present.     Mental Status: He is alert and oriented to person, place, and time.  Psychiatric:        Mood and Affect: Mood normal.  Behavior: Behavior normal.    Results for orders placed or performed in visit on 06/23/20 (from the past 24 hour(s))  POCT glucose (manual entry)     Status: Abnormal   Collection Time: 06/23/20  3:10 PM  Result Value Ref Range   POC Glucose 107 (A) 70 - 99 mg/dl  POCT glycosylated hemoglobin (Hb A1C)     Status: Abnormal   Collection Time: 06/23/20  3:16 PM  Result Value Ref Range   Hemoglobin A1C 6.9 (A) 4.0 - 5.6 %   HbA1c POC (<> result, manual entry)     HbA1c, POC (prediabetic range)     HbA1c, POC (controlled diabetic range)     A total of 30 minutes was spent with the patient, greater than 50% of which was in counseling/coordination of care regarding diabetes and hypertension and cardiovascular risks associated with these conditions, review of all medications, review of most recent blood work results including today's hemoglobin A1c, review of most recent office visit notes, education on nutrition, health maintenance items, documentation, prognosis, and need for follow-up in 3 months.   ASSESSMENT & PLAN: Type 2 diabetes mellitus with hyperglycemia, without long-term current use of insulin (HCC) Much improved diabetes with hemoglobin A1c at goal 6.9. Continue Janumet.  Diet and nutrition discussed. Continue statin therapy with Lipitor 80 mg daily. Continue daily baby aspirin. Follow-up in 6 months.  Romeo was seen today for diabetes.  Diagnoses and all orders for this visit:  Type 2 diabetes mellitus with hyperglycemia, without long-term current use of insulin (HCC) -     POCT glucose (manual entry) -      POCT glycosylated hemoglobin (Hb A1C) -     Microalbumin, urine -     HM Diabetes Foot Exam -     Ambulatory referral to Ophthalmology  Interstitial pulmonary disease ([place])  Malignant neoplasm of prostate ([place])  Sinus node dysfunction ([place])  Essential hypertension  Hypertension associated with diabetes [facility])    Patient Instructions       If you have lab work done today you will be contacted with your lab results within the next 2 weeks.  If you have not heard from [place] then please contact us. The fastest way to get your results is to register for My Chart.   IF you received an x-ray today, you will receive an invoice from [facility]. Please contact [facility] at [phone] with questions or concerns regarding your invoice.   IF you received labwork today, you will receive an invoice from [facility]. Please contact [facility] at [phone] with questions or concerns regarding your invoice.   Our billing staff will not be able to assist you with questions regarding bills from these companies.  You will be contacted with the lab results as soon as they are available. The fastest way to get your results is to activate your My Chart account. Instructions are located on the last page of this paperwork. If you have not heard from [place] regarding the results in 2 weeks, please contact this office.     Diabetes Mellitus and Nutrition, Adult When you have diabetes (diabetes mellitus), it is very important to have healthy eating habits because your blood sugar (glucose) levels are greatly affected by what you eat and drink. Eating healthy foods in the appropriate amounts, at about the same times every day, can help you:  Control your blood glucose.  Lower your risk of heart disease.  Improve your blood pressure.  Reach or maintain a  healthy weight. Every person with diabetes is different, and each person has different needs for a meal plan. Your health care provider  may recommend that you work with a diet and nutrition specialist (dietitian) to make a meal plan that is best for you. Your meal plan may vary depending on factors such as:  The calories you need.  The medicines you take.  Your weight.  Your blood glucose, blood pressure, and cholesterol levels.  Your activity level.  Other health conditions you have, such as heart or kidney disease. How do carbohydrates affect me? Carbohydrates, also called carbs, affect your blood glucose level more than any other type of food. Eating carbs naturally raises the amount of glucose in your blood. Carb counting is a method for keeping track of how many carbs you eat. Counting carbs is important to keep your blood glucose at a healthy level, especially if you use insulin or take certain oral diabetes medicines. It is important to know how many carbs you can safely have in each meal. This is different for every person. Your dietitian can help you calculate how many carbs you should have at each meal and for each snack. Foods that contain carbs include:  Bread, cereal, rice, pasta, and crackers.  Potatoes and corn.  Peas, beans, and lentils.  Milk and yogurt.  Fruit and juice.  Desserts, such as cakes, cookies, ice cream, and candy. How does alcohol affect me? Alcohol can cause a sudden decrease in blood glucose (hypoglycemia), especially if you use insulin or take certain oral diabetes medicines. Hypoglycemia can be a life-threatening condition. Symptoms of hypoglycemia (sleepiness, dizziness, and confusion) are similar to symptoms of having too much alcohol. If your health care provider says that alcohol is safe for you, follow these guidelines:  Limit alcohol intake to no more than 1 drink per day for nonpregnant women and 2 drinks per day for men. One drink equals 12 oz of beer, 5 oz of wine, or 1 oz of hard liquor.  Do not drink on an empty stomach.  Keep yourself hydrated with water, diet soda,  or unsweetened iced tea.  Keep in mind that regular soda, juice, and other mixers may contain a lot of sugar and must be counted as carbs. What are tips for following this plan?  Reading food labels  Start by checking the serving size on the "Nutrition Facts" label of packaged foods and drinks. The amount of calories, carbs, fats, and other nutrients listed on the label is based on one serving of the item. Many items contain more than one serving per package.  Check the total grams (g) of carbs in one serving. You can calculate the number of servings of carbs in one serving by dividing the total carbs by 15. For example, if a food has 30 g of total carbs, it would be equal to 2 servings of carbs.  Check the number of grams (g) of saturated and trans fats in one serving. Choose foods that have low or no amount of these fats.  Check the number of milligrams (mg) of salt (sodium) in one serving. Most people should limit total sodium intake to less than 2,300 mg per day.  Always check the nutrition information of foods labeled as "low-fat" or "nonfat". These foods may be higher in added sugar or refined carbs and should be avoided.  Talk to your dietitian to identify your daily goals for nutrients listed on the label. Shopping  Avoid buying canned, premade, or  processed foods. These foods tend to be high in fat, sodium, and added sugar.  Shop around the outside edge of the grocery store. This includes fresh fruits and vegetables, bulk grains, fresh meats, and fresh dairy. Cooking  Use low-heat cooking methods, such as baking, instead of high-heat cooking methods like deep frying.  Cook using healthy oils, such as olive, canola, or sunflower oil.  Avoid cooking with butter, cream, or high-fat meats. Meal planning  Eat meals and snacks regularly, preferably at the same times every day. Avoid going long periods of time without eating.  Eat foods high in fiber, such as fresh fruits,  vegetables, beans, and whole grains. Talk to your dietitian about how many servings of carbs you can eat at each meal.  Eat 4-6 ounces (oz) of lean protein each day, such as lean meat, chicken, fish, eggs, or tofu. One oz of lean protein is equal to: ? 1 oz of meat, chicken, or fish. ? 1 egg. ?  cup of tofu.  Eat some foods each day that contain healthy fats, such as avocado, nuts, seeds, and fish. Lifestyle  Check your blood glucose regularly.  Exercise regularly as told by your health care provider. This may include: ? 150 minutes of moderate-intensity or vigorous-intensity exercise each week. This could be brisk walking, biking, or water aerobics. ? Stretching and doing strength exercises, such as yoga or weightlifting, at least 2 times a week.  Take medicines as told by your health care provider.  Do not use any products that contain nicotine or tobacco, such as cigarettes and e-cigarettes. If you need help quitting, ask your health care provider.  Work with a Social worker or diabetes educator to identify strategies to manage stress and any emotional and social challenges. Questions to ask a health care provider  Do I need to meet with a diabetes educator?  Do I need to meet with a dietitian?  What number can I call if I have questions?  When are the best times to check my blood glucose? Where to find more information:  American Diabetes Association: diabetes.org  Academy of Nutrition and Dietetics: www.eatright.[employer] of Diabetes and Digestive and Kidney Diseases (NIH): [employer] Summary  A healthy meal plan will help you control your blood glucose and maintain a healthy lifestyle.  Working with a diet and nutrition specialist (dietitian) can help you make a meal plan that is best for you.  Keep in mind that carbohydrates (carbs) and alcohol have immediate effects on your blood glucose levels. It is important to count carbs and to use alcohol  carefully. This information is not intended to replace advice given to you by your health care provider. Make sure you discuss any questions you have with your health care provider. Document Revised: 06/30/2017 Document Reviewed: 08/22/2016 Elsevier Patient Education  2020 Elsevier Inc.       Agustina Caroli, MD [facility]

## 2020-06-24 LAB — MICROALBUMIN, URINE: Microalbumin, Urine: 4.6 ug/mL

## 2020-07-13 DIAGNOSIS — E1136 Type 2 diabetes mellitus with diabetic cataract: Secondary | ICD-10-CM | POA: Diagnosis not present

## 2020-07-13 DIAGNOSIS — H2513 Age-related nuclear cataract, bilateral: Secondary | ICD-10-CM | POA: Diagnosis not present

## 2020-07-13 DIAGNOSIS — H04123 Dry eye syndrome of bilateral lacrimal glands: Secondary | ICD-10-CM | POA: Diagnosis not present

## 2020-07-13 DIAGNOSIS — Z9889 Other specified postprocedural states: Secondary | ICD-10-CM | POA: Diagnosis not present

## 2020-08-28 ENCOUNTER — Other Ambulatory Visit: Payer: Self-pay | Admitting: Internal Medicine

## 2020-09-13 ENCOUNTER — Other Ambulatory Visit: Payer: Self-pay | Admitting: Physician Assistant

## 2020-09-28 ENCOUNTER — Ambulatory Visit (INDEPENDENT_AMBULATORY_CARE_PROVIDER_SITE_OTHER): Payer: PPO | Admitting: Emergency Medicine

## 2020-09-28 ENCOUNTER — Encounter: Payer: Self-pay | Admitting: Emergency Medicine

## 2020-09-28 ENCOUNTER — Other Ambulatory Visit: Payer: Self-pay

## 2020-09-28 VITALS — BP 133/58 | HR 55 | Temp 97.7°F | Resp 16 | Ht 65.0 in | Wt 178.0 lb

## 2020-09-28 DIAGNOSIS — I495 Sick sinus syndrome: Secondary | ICD-10-CM | POA: Diagnosis not present

## 2020-09-28 DIAGNOSIS — J849 Interstitial pulmonary disease, unspecified: Secondary | ICD-10-CM

## 2020-09-28 DIAGNOSIS — E1159 Type 2 diabetes mellitus with other circulatory complications: Secondary | ICD-10-CM | POA: Diagnosis not present

## 2020-09-28 DIAGNOSIS — C61 Malignant neoplasm of prostate: Secondary | ICD-10-CM

## 2020-09-28 DIAGNOSIS — I152 Hypertension secondary to endocrine disorders: Secondary | ICD-10-CM | POA: Diagnosis not present

## 2020-09-28 DIAGNOSIS — E1165 Type 2 diabetes mellitus with hyperglycemia: Secondary | ICD-10-CM | POA: Diagnosis not present

## 2020-09-28 LAB — CMP14+EGFR
ALT: 20 IU/L (ref 0–44)
AST: 18 IU/L (ref 0–40)
Albumin/Globulin Ratio: 2 (ref 1.2–2.2)
Albumin: 4.8 g/dL (ref 3.8–4.8)
Alkaline Phosphatase: 58 IU/L (ref 44–121)
BUN/Creatinine Ratio: 14 (ref 10–24)
BUN: 11 mg/dL (ref 8–27)
Bilirubin Total: 0.4 mg/dL (ref 0.0–1.2)
CO2: 23 mmol/L (ref 20–29)
Calcium: 9.7 mg/dL (ref 8.6–10.2)
Chloride: 102 mmol/L (ref 96–106)
Creatinine, Ser: 0.81 mg/dL (ref 0.76–1.27)
Globulin, Total: 2.4 g/dL (ref 1.5–4.5)
Glucose: 110 mg/dL — ABNORMAL HIGH (ref 65–99)
Potassium: 5.2 mmol/L (ref 3.5–5.2)
Sodium: 140 mmol/L (ref 134–144)
Total Protein: 7.2 g/dL (ref 6.0–8.5)
eGFR: 95 mL/min/{1.73_m2} (ref >59)

## 2020-09-28 LAB — POCT GLYCOSYLATED HEMOGLOBIN (HGB A1C): Hemoglobin A1C: 6.3 % — AB (ref 4.0–5.6)

## 2020-09-28 LAB — GLUCOSE, POCT (MANUAL RESULT ENTRY): POC Glucose: 109 mg/dl — AB (ref 70–99)

## 2020-09-28 MED ORDER — JANUMET 50-500 MG PO TABS: 1.0000 | ORAL_TABLET | Freq: Two times a day (BID) | ORAL | 3 refills | Status: DC

## 2020-09-28 NOTE — Assessment & Plan Note (Signed)
Well-controlled hypertension.  Continue present medications.  No changes. Well-controlled diabetes with hemoglobin A1c at 6.3, better than before.  Continue Janumet 50-500 mg 1 tablet twice a day with a meal. Diet and nutrition discussed. Continue atorvastatin 80 mg daily. Continue daily baby aspirin. Follow-up in 6 months.

## 2020-09-28 NOTE — Patient Instructions (Addendum)
   If you have lab work done today you will be contacted with your lab results within the next 2 weeks.  If you have not heard from us then please contact us. The fastest way to get your results is to register for My Chart.   IF you received an x-ray today, you will receive an invoice from [facility]. Please contact [facility] at [phone] with questions or concerns regarding your invoice.   IF you received labwork today, you will receive an invoice from [facility]. Please contact [facility] at [phone] with questions or concerns regarding your invoice.   Our billing staff will not be able to assist you with questions regarding bills from these companies.  You will be contacted with the lab results as soon as they are available. The fastest way to get your results is to activate your My Chart account. Instructions are located on the last page of this paperwork. If you have not heard from us regarding the results in 2 weeks, please contact this office.     Diabetes Mellitus and Nutrition, Adult When you have diabetes, or diabetes mellitus, it is very important to have healthy eating habits because your blood sugar (glucose) levels are greatly affected by what you eat and drink. Eating healthy foods in the right amounts, at about the same times every day, can help you:  Control your blood glucose.  Lower your risk of heart disease.  Improve your blood pressure.  Reach or maintain a healthy weight. What can affect my meal plan? Every person with diabetes is different, and each person has different needs for a meal plan. Your health care provider may recommend that you work with a dietitian to make a meal plan that is best for you. Your meal plan may vary depending on factors such as:  The calories you need.  The medicines you take.  Your weight.  Your blood glucose, blood pressure, and cholesterol levels.  Your activity level.  Other health conditions you  have, such as heart or kidney disease. How do carbohydrates affect me? Carbohydrates, also called carbs, affect your blood glucose level more than any other type of food. Eating carbs naturally raises the amount of glucose in your blood. Carb counting is a method for keeping track of how many carbs you eat. Counting carbs is important to keep your blood glucose at a healthy level, especially if you use insulin or take certain oral diabetes medicines. It is important to know how many carbs you can safely have in each meal. This is different for every person. Your dietitian can help you calculate how many carbs you should have at each meal and for each snack. How does alcohol affect me? Alcohol can cause a sudden decrease in blood glucose (hypoglycemia), especially if you use insulin or take certain oral diabetes medicines. Hypoglycemia can be a life-threatening condition. Symptoms of hypoglycemia, such as sleepiness, dizziness, and confusion, are similar to symptoms of having too much alcohol.  Do not drink alcohol if: ? Your health care provider tells you not to drink. ? You are pregnant, may be pregnant, or are planning to become pregnant.  If you drink alcohol: ? Do not drink on an empty stomach. ? Limit how much you use to:  0-1 drink a day for women.  0-2 drinks a day for men. ? Be aware of how much alcohol is in your drink. In the [place] one drink equals one 12 oz bottle of beer (355 mL),   one 5 oz glass of wine (148 mL), or one 1 oz glass of hard liquor (44 mL). ? Keep yourself hydrated with water, diet soda, or unsweetened iced tea.  Keep in mind that regular soda, juice, and other mixers may contain a lot of sugar and must be counted as carbs. What are tips for following this plan? Reading food labels  Start by checking the serving size on the "Nutrition Facts" label of packaged foods and drinks. The amount of calories, carbs, fats, and other nutrients listed on the label is based on  one serving of the item. Many items contain more than one serving per package.  Check the total grams (g) of carbs in one serving. You can calculate the number of servings of carbs in one serving by dividing the total carbs by 15. For example, if a food has 30 g of total carbs per serving, it would be equal to 2 servings of carbs.  Check the number of grams (g) of saturated fats and trans fats in one serving. Choose foods that have a low amount or none of these fats.  Check the number of milligrams (mg) of salt (sodium) in one serving. Most people should limit total sodium intake to less than 2,300 mg per day.  Always check the nutrition information of foods labeled as "low-fat" or "nonfat." These foods may be higher in added sugar or refined carbs and should be avoided.  Talk to your dietitian to identify your daily goals for nutrients listed on the label. Shopping  Avoid buying canned, pre-made, or processed foods. These foods tend to be high in fat, sodium, and added sugar.  Shop around the outside edge of the grocery store. This is where you will most often find fresh fruits and vegetables, bulk grains, fresh meats, and fresh dairy. Cooking  Use low-heat cooking methods, such as baking, instead of high-heat cooking methods like deep frying.  Cook using healthy oils, such as olive, canola, or sunflower oil.  Avoid cooking with butter, cream, or high-fat meats. Meal planning  Eat meals and snacks regularly, preferably at the same times every day. Avoid going long periods of time without eating.  Eat foods that are high in fiber, such as fresh fruits, vegetables, beans, and whole grains. Talk with your dietitian about how many servings of carbs you can eat at each meal.  Eat 4-6 oz (112-168 g) of lean protein each day, such as lean meat, chicken, fish, eggs, or tofu. One ounce (oz) of lean protein is equal to: ? 1 oz (28 g) of meat, chicken, or fish. ? 1 egg. ?  cup (62 g) of  tofu.  Eat some foods each day that contain healthy fats, such as avocado, nuts, seeds, and fish.   What foods should I eat? Fruits Berries. Apples. Oranges. Peaches. Apricots. Plums. Grapes. Mango. Papaya. Pomegranate. Kiwi. Cherries. Vegetables Lettuce. Spinach. Leafy greens, including kale, chard, collard greens, and mustard greens. Beets. Cauliflower. Cabbage. Broccoli. Carrots. Green beans. Tomatoes. Peppers. Onions. Cucumbers. Brussels sprouts. Grains Whole grains, such as whole-wheat or whole-grain bread, crackers, tortillas, cereal, and pasta. Unsweetened oatmeal. Quinoa. Brown or wild rice. Meats and other proteins Seafood. Poultry without skin. Lean cuts of poultry and beef. Tofu. Nuts. Seeds. Dairy Low-fat or fat-free dairy products such as milk, yogurt, and cheese. The items listed above may not be a complete list of foods and beverages you can eat. Contact a dietitian for more information. What foods should I avoid? Fruits Fruits canned   with syrup. Vegetables Canned vegetables. Frozen vegetables with butter or cream sauce. Grains Refined white flour and flour products such as bread, pasta, snack foods, and cereals. Avoid all processed foods. Meats and other proteins Fatty cuts of meat. Poultry with skin. Breaded or fried meats. Processed meat. Avoid saturated fats. Dairy Full-fat yogurt, cheese, or milk. Beverages Sweetened drinks, such as soda or iced tea. The items listed above may not be a complete list of foods and beverages you should avoid. Contact a dietitian for more information. Questions to ask a health care provider  Do I need to meet with a diabetes educator?  Do I need to meet with a dietitian?  What number can I call if I have questions?  When are the best times to check my blood glucose? Where to find more information:  American Diabetes Association: diabetes.org  Academy of Nutrition and Dietetics: www.eatright.org  National Institute of  Diabetes and Digestive and Kidney Diseases: www.niddk.nih.gov  Association of Diabetes Care and Education Specialists: www.diabeteseducator.org Summary  It is important to have healthy eating habits because your blood sugar (glucose) levels are greatly affected by what you eat and drink.  A healthy meal plan will help you control your blood glucose and maintain a healthy lifestyle.  Your health care provider may recommend that you work with a dietitian to make a meal plan that is best for you.  Keep in mind that carbohydrates (carbs) and alcohol have immediate effects on your blood glucose levels. It is important to count carbs and to use alcohol carefully. This information is not intended to replace advice given to you by your health care provider. Make sure you discuss any questions you have with your health care provider. Document Revised: 06/25/2019 Document Reviewed: 06/25/2019 [employer]vier Patient Education  2021 [employer]  

## 2020-09-28 NOTE — Progress Notes (Signed)
Kenneth Velez 71 y.o.   Chief Complaint  Patient presents with  . Diabetes  . Hypertension  . Medication Refill    Janumet    HISTORY OF PRESENT ILLNESS: This is a 70 y.o. male with history of diabetes and hypertension here for follow-up and medication refill.  Presently taking Janumet 50-500 mg twice a day. Has no complaints or medical concerns.  Doing well. Takes atorvastatin 80 mg daily and 1 baby aspirin a day. Also taking Cardizem CD and Tambocor for sinus node dysfunction and periods of SVTs.  HPI   Prior to Admission medications   Medication Sig Start Date End Date Taking? Authorizing Provider  aspirin EC 81 MG tablet Take 1 tablet (81 mg total) by mouth daily. 02/25/19  Yes Turner, Eber Hong, MD  atorvastatin (LIPITOR) 80 MG tablet TAKE 1 TABLET(80 MG) BY MOUTH DAILY 06/15/20  Yes Imogene Burn, PA-C  diltiazem (CARDIZEM CD) 180 MG 24 hr capsule TAKE 1 CAPSULE(180 MG) BY MOUTH DAILY 09/15/20  Yes Sueanne Margarita, MD  flecainide (TAMBOCOR) 50 MG tablet Take 1 tablet by mouth twice daily 08/28/20  Yes Evans Lance, MD  tadalafil (CIALIS) 20 MG tablet Take 20 mg by mouth every other day. 07/30/19  Yes [provider]  sitaGLIPtin-metformin (JANUMET) 50-500 MG tablet Take 1 tablet by mouth 2 (two) times daily with a meal. 09/28/20 12/27/20  Horald Pollen, MD    No Known Allergies  Patient Active Problem List   Diagnosis Date Noted  . Type 2 diabetes mellitus with hyperglycemia, without long-term current use of insulin ([place]) 06/23/2020  . Interstitial pulmonary disease ([place]) 06/23/2020  . Hypertension associated with diabetes ([place]) 10/08/2019  . Sinus node dysfunction ([place]) 10/08/2019  . SVT (supraventricular tachycardia) ([place]) 07/18/2018  . Wide-complex tachycardia ([place]) 07/18/2018  . Exertional angina ([facility]) 06/04/2018  . Malignant neoplasm of prostate ([place]) 04/20/2015  . Osteoarthritis of left hip 09/21/2012    Past Medical History:  Diagnosis  Date  . Arthritis   . Malignant neoplasm of prostate ([place]) 04/20/2015  . Osteoarthritis of left hip 09/21/2012  . Prostate cancer [facility])     Past Surgical History:  Procedure Laterality Date  . JOINT REPLACEMENT    . PROSTATE BIOPSY    . 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 TOTAL HIP ARTHROPLASTY Left 09/21/2012   Procedure: TOTAL HIP ARTHROPLASTY;  Surgeon: Yvette Rack., MD;  Location: [place];  Service: Orthopedics;  Laterality: Left;    Social History   Socioeconomic History  . Marital status: Married    Spouse name: Not on file  . Number of children: Not on file  . Years of education: Not on file  . Highest education level: Not on file  Occupational History  . Not on file  Tobacco Use  . Smoking status: Never Smoker  . Smokeless tobacco: Never Used  Vaping Use  . Vaping Use: Never used  Substance and Sexual Activity  . Alcohol use: No    Comment: occasional  . Drug use: No  . Sexual activity: Yes  Other Topics Concern  . Not on file  Social History Narrative  . Not on file   Social Determinants of Health   Financial Resource Strain: Not on file  Food Insecurity: Not on file  Transportation Needs: Not on file  Physical Activity: Not on file  Stress: Not on file  Social Connections: Not on file  Intimate Partner Violence: Not on  file    Family History  Problem Relation Age of Onset  . Diabetes Mother   . Colon cancer Father   . Pancreatic cancer Neg Hx   . Rectal cancer Neg Hx   . Stomach cancer Neg Hx   . Esophageal cancer Neg Hx   . Cancer Neg Hx      Review of Systems  Constitutional: Negative.  Negative for chills and fever.  HENT: Negative.  Negative for congestion and sore throat.   Respiratory: Negative.  Negative for cough and shortness of breath.   Cardiovascular: Negative.  Negative for chest pain and palpitations.  Gastrointestinal: Negative for abdominal pain, diarrhea, nausea and vomiting.  Genitourinary: Negative.   Negative for dysuria and hematuria.  Skin: Negative.  Negative for rash.  Neurological: Negative.  Negative for dizziness and headaches.  All other systems reviewed and are negative.    Today's Vitals   09/28/20 1011  BP: (!) 133/58  Pulse: (!) 55  Resp: 16  Temp: 97.7 F (36.5 C)  TempSrc: Temporal  SpO2: 97%  Weight: 178 lb (80.7 kg)  Height: 5' 5"  (1.651 m)   Body mass index is 29.62 kg/m. Wt Readings from Last 3 Encounters:  09/28/20 178 lb (80.7 kg)  06/23/20 177 lb (80.3 kg)  04/16/20 181 lb (82.1 kg)     Physical Exam Vitals reviewed.  Constitutional:      Appearance: Normal appearance.  HENT:     Head: Normocephalic.  Eyes:     Extraocular Movements: Extraocular movements intact.     Conjunctiva/sclera: Conjunctivae normal.     Pupils: Pupils are equal, round, and reactive to light.  Cardiovascular:     Rate and Rhythm: Normal rate and regular rhythm.     Pulses: Normal pulses.     Heart sounds: Normal heart sounds.  Pulmonary:     Effort: Pulmonary effort is normal.     Breath sounds: Normal breath sounds.  Musculoskeletal:        General: Normal range of motion.     Cervical back: Normal range of motion.  Skin:    General: Skin is warm and dry.     Capillary Refill: Capillary refill takes less than 2 seconds.  Neurological:     General: No focal deficit present.     Mental Status: He is alert and oriented to person, place, and time.  Psychiatric:        Mood and Affect: Mood normal.        Behavior: Behavior normal.    A total of 30 minutes was spent with the patient, greater than 50% of which was in counseling/coordination of care regarding diabetes and hypertension and cardiovascular risks associated with these conditions, review of all medications, review of most recent blood work results including today's hemoglobin A1c, education on nutrition, review of most recent office visit notes, health maintenance items, prognosis, documentation, and need  for follow-up.   ASSESSMENT & PLAN: Hypertension associated with diabetes ([place]) Well-controlled hypertension.  Continue present medications.  No changes. Well-controlled diabetes with hemoglobin A1c at 6.3, better than before.  Continue Janumet 50-500 mg 1 tablet twice a day with a meal. Diet and nutrition discussed. Continue atorvastatin 80 mg daily. Continue daily baby aspirin. Follow-up in 6 months.  Kenneth Velez was seen today for diabetes, hypertension and medication refill.  Diagnoses and all orders for this visit:  Hypertension associated with diabetes ([place]) -     POCT glucose (manual entry) -     POCT  glycosylated hemoglobin (Hb A1C)  Type 2 diabetes mellitus with hyperglycemia, without long-term current use of insulin (H[facility]) -     CMP14+EGFR -     sitaGLIPtin-metformin (JANUMET) 50-500 MG tablet; Take 1 tablet by mouth 2 (two) times daily with a meal.  Interstitial pulmonary disease ([place])  Malignant neoplasm of prostate ([place])  Sinus node dysfunction ([place])    Patient Instructions       If you have lab work done today you will be contacted with your lab results within the next 2 weeks.  If you have not heard from [place] then please contact us. The fastest way to get your results is to register for My Chart.   IF you received an x-ray today, you will receive an invoice from [facility]. Please contact [facility] at [phone] with questions or concerns regarding your invoice.   IF you received labwork today, you will receive an invoice from [facility]. Please contact [facility] at [phone] with questions or concerns regarding your invoice.   Our billing staff will not be able to assist you with questions regarding bills from these companies.  You will be contacted with the lab results as soon as they are available. The fastest way to get your results is to activate your My Chart account. Instructions are located on the last page of this paperwork. If you  have not heard from [place] regarding the results in 2 weeks, please contact this office.     Diabetes Mellitus and Nutrition, Adult When you have diabetes, or diabetes mellitus, it is very important to have healthy eating habits because your blood sugar (glucose) levels are greatly affected by what you eat and drink. Eating healthy foods in the right amounts, at about the same times every day, can help you:  Control your blood glucose.  Lower your risk of heart disease.  Improve your blood pressure.  Reach or maintain a healthy weight. What can affect my meal plan? Every person with diabetes is different, and each person has different needs for a meal plan. Your health care provider may recommend that you work with a dietitian to make a meal plan that is best for you. Your meal plan may vary depending on factors such as:  The calories you need.  The medicines you take.  Your weight.  Your blood glucose, blood pressure, and cholesterol levels.  Your activity level.  Other health conditions you have, such as heart or kidney disease. How do carbohydrates affect me? Carbohydrates, also called carbs, affect your blood glucose level more than any other type of food. Eating carbs naturally raises the amount of glucose in your blood. Carb counting is a method for keeping track of how many carbs you eat. Counting carbs is important to keep your blood glucose at a healthy level, especially if you use insulin or take certain oral diabetes medicines. It is important to know how many carbs you can safely have in each meal. This is different for every person. Your dietitian can help you calculate how many carbs you should have at each meal and for each snack. How does alcohol affect me? Alcohol can cause a sudden decrease in blood glucose (hypoglycemia), especially if you use insulin or take certain oral diabetes medicines. Hypoglycemia can be a life-threatening condition. Symptoms of hypoglycemia,  such as sleepiness, dizziness, and confusion, are similar to symptoms of having too much alcohol.  Do not drink alcohol if: ? Your health care provider tells you not to drink. ?  You are pregnant, may be pregnant, or are planning to become pregnant.  If you drink alcohol: ? Do not drink on an empty stomach. ? Limit how much you use to:  0-1 drink a day for women.  0-2 drinks a day for men. ? Be aware of how much alcohol is in your drink. In the [place] one drink equals one 12 oz bottle of beer (355 mL), one 5 oz glass of wine (148 mL), or one 1 oz glass of hard liquor (44 mL). ? Keep yourself hydrated with water, diet soda, or unsweetened iced tea.  Keep in mind that regular soda, juice, and other mixers may contain a lot of sugar and must be counted as carbs. What are tips for following this plan? Reading food labels  Start by checking the serving size on the "Nutrition Facts" label of packaged foods and drinks. The amount of calories, carbs, fats, and other nutrients listed on the label is based on one serving of the item. Many items contain more than one serving per package.  Check the total grams (g) of carbs in one serving. You can calculate the number of servings of carbs in one serving by dividing the total carbs by 15. For example, if a food has 30 g of total carbs per serving, it would be equal to 2 servings of carbs.  Check the number of grams (g) of saturated fats and trans fats in one serving. Choose foods that have a low amount or none of these fats.  Check the number of milligrams (mg) of salt (sodium) in one serving. Most people should limit total sodium intake to less than 2,300 mg per day.  Always check the nutrition information of foods labeled as "low-fat" or "nonfat." These foods may be higher in added sugar or refined carbs and should be avoided.  Talk to your dietitian to identify your daily goals for nutrients listed on the label. Shopping  Avoid buying canned,  pre-made, or processed foods. These foods tend to be high in fat, sodium, and added sugar.  Shop around the outside edge of the grocery store. This is where you will most often find fresh fruits and vegetables, bulk grains, fresh meats, and fresh dairy. Cooking  Use low-heat cooking methods, such as baking, instead of high-heat cooking methods like deep frying.  Cook using healthy oils, such as olive, canola, or sunflower oil.  Avoid cooking with butter, cream, or high-fat meats. Meal planning  Eat meals and snacks regularly, preferably at the same times every day. Avoid going long periods of time without eating.  Eat foods that are high in fiber, such as fresh fruits, vegetables, beans, and whole grains. Talk with your dietitian about how many servings of carbs you can eat at each meal.  Eat 4-6 oz (112-168 g) of lean protein each day, such as lean meat, chicken, fish, eggs, or tofu. One ounce (oz) of lean protein is equal to: ? 1 oz (28 g) of meat, chicken, or fish. ? 1 egg. ?  cup (62 g) of tofu.  Eat some foods each day that contain healthy fats, such as avocado, nuts, seeds, and fish.   What foods should I eat? Fruits Berries. Apples. Oranges. Peaches. Apricots. Plums. Grapes. Mango. Papaya. Pomegranate. Kiwi. Cherries. Vegetables Lettuce. Spinach. Leafy greens, including kale, chard, collard greens, and mustard greens. Beets. Cauliflower. Cabbage. Broccoli. Carrots. Green beans. Tomatoes. Peppers. Onions. Cucumbers. Brussels sprouts. Grains Whole grains, such as whole-wheat or whole-grain bread, crackers, tortillas,  cereal, and pasta. Unsweetened oatmeal. Quinoa. Brown or wild rice. Meats and other proteins Seafood. Poultry without skin. Lean cuts of poultry and beef. Tofu. Nuts. Seeds. Dairy Low-fat or fat-free dairy products such as milk, yogurt, and cheese. The items listed above may not be a complete list of foods and beverages you can eat. Contact a dietitian for more  information. What foods should I avoid? Fruits Fruits canned with syrup. Vegetables Canned vegetables. Frozen vegetables with butter or cream sauce. Grains Refined white flour and flour products such as bread, pasta, snack foods, and cereals. Avoid all processed foods. Meats and other proteins Fatty cuts of meat. Poultry with skin. Breaded or fried meats. Processed meat. Avoid saturated fats. Dairy Full-fat yogurt, cheese, or milk. Beverages Sweetened drinks, such as soda or iced tea. The items listed above may not be a complete list of foods and beverages you should avoid. Contact a dietitian for more information. Questions to ask a health care provider  Do I need to meet with a diabetes educator?  Do I need to meet with a dietitian?  What number can I call if I have questions?  When are the best times to check my blood glucose? Where to find more information:  American Diabetes Association: diabetes.org  Academy of Nutrition and Dietetics: www.eatright.[employer] of Diabetes and Digestive and Kidney Diseases: [employer]  Association of Diabetes Care and Education Specialists: www.diabeteseducator.org Summary  It is important to have healthy eating habits because your blood sugar (glucose) levels are greatly affected by what you eat and drink.  A healthy meal plan will help you control your blood glucose and maintain a healthy lifestyle.  Your health care provider may recommend that you work with a dietitian to make a meal plan that is best for you.  Keep in mind that carbohydrates (carbs) and alcohol have immediate effects on your blood glucose levels. It is important to count carbs and to use alcohol carefully. This information is not intended to replace advice given to you by your health care provider. Make sure you discuss any questions you have with your health care provider. Document Revised: 06/25/2019 Document Reviewed: 06/25/2019 Elsevier  Patient Education  2021 Elsevier Inc.      Agustina Caroli, MD [facility]

## 2020-10-14 DIAGNOSIS — Z8546 Personal history of malignant neoplasm of prostate: Secondary | ICD-10-CM | POA: Diagnosis not present

## 2020-10-21 DIAGNOSIS — Z8546 Personal history of malignant neoplasm of prostate: Secondary | ICD-10-CM | POA: Diagnosis not present

## 2020-10-21 DIAGNOSIS — N5201 Erectile dysfunction due to arterial insufficiency: Secondary | ICD-10-CM | POA: Diagnosis not present

## 2021-03-29 ENCOUNTER — Other Ambulatory Visit: Payer: Self-pay

## 2021-03-29 ENCOUNTER — Ambulatory Visit (INDEPENDENT_AMBULATORY_CARE_PROVIDER_SITE_OTHER): Payer: PPO | Admitting: Emergency Medicine

## 2021-03-29 ENCOUNTER — Encounter: Payer: Self-pay | Admitting: Emergency Medicine

## 2021-03-29 VITALS — BP 130/70 | Temp 98.0°F | Ht 65.0 in | Wt 174.0 lb

## 2021-03-29 DIAGNOSIS — I152 Hypertension secondary to endocrine disorders: Secondary | ICD-10-CM

## 2021-03-29 DIAGNOSIS — I495 Sick sinus syndrome: Secondary | ICD-10-CM

## 2021-03-29 DIAGNOSIS — E1159 Type 2 diabetes mellitus with other circulatory complications: Secondary | ICD-10-CM | POA: Diagnosis not present

## 2021-03-29 DIAGNOSIS — Z23 Encounter for immunization: Secondary | ICD-10-CM | POA: Diagnosis not present

## 2021-03-29 DIAGNOSIS — E785 Hyperlipidemia, unspecified: Secondary | ICD-10-CM

## 2021-03-29 DIAGNOSIS — I700 Atherosclerosis of aorta: Secondary | ICD-10-CM | POA: Insufficient documentation

## 2021-03-29 DIAGNOSIS — E1169 Type 2 diabetes mellitus with other specified complication: Secondary | ICD-10-CM | POA: Diagnosis not present

## 2021-03-29 DIAGNOSIS — E1165 Type 2 diabetes mellitus with hyperglycemia: Secondary | ICD-10-CM | POA: Diagnosis not present

## 2021-03-29 LAB — LIPID PANEL
Cholesterol: 122 mg/dL (ref 0–200)
HDL: 39.3 mg/dL (ref >39.00)
LDL Cholesterol: 69 mg/dL (ref 0–99)
NonHDL: 82.5
Total CHOL/HDL Ratio: 3
Triglycerides: 70 mg/dL (ref 0.0–149.0)
VLDL: 14 mg/dL (ref 0.0–40.0)

## 2021-03-29 LAB — COMPREHENSIVE METABOLIC PANEL
ALT: 29 U/L (ref 0–53)
AST: 20 U/L (ref 0–37)
Albumin: 4.3 g/dL (ref 3.5–5.2)
Alkaline Phosphatase: 50 U/L (ref 39–117)
BUN: 14 mg/dL (ref 6–23)
CO2: 26 mEq/L (ref 19–32)
Calcium: 9.3 mg/dL (ref 8.4–10.5)
Chloride: 105 mEq/L (ref 96–112)
Creatinine, Ser: 0.87 mg/dL (ref 0.40–1.50)
GFR: 88 mL/min (ref >60.00)
Glucose, Bld: 124 mg/dL — ABNORMAL HIGH (ref 70–99)
Potassium: 4.5 mEq/L (ref 3.5–5.1)
Sodium: 139 mEq/L (ref 135–145)
Total Bilirubin: 0.5 mg/dL (ref 0.2–1.2)
Total Protein: 7.3 g/dL (ref 6.0–8.3)

## 2021-03-29 LAB — POCT GLYCOSYLATED HEMOGLOBIN (HGB A1C): Hemoglobin A1C: 6.2 % — AB (ref 4.0–5.6)

## 2021-03-29 NOTE — Assessment & Plan Note (Signed)
Well-controlled hypertension.  Did not take blood pressure medication this morning.  Normal blood pressure readings at home.  Dietary approaches to stop hypertension discussed. Well-controlled diabetes with hemoglobin A1c is 6.2. Lab Results  Component Value Date   HGBA1C 6.2 (A) 03/29/2021  Continue Janumet 50-500 mg twice a day.  Diet and nutrition discussed. Follow-up in 6 months.

## 2021-03-29 NOTE — Assessment & Plan Note (Signed)
Diet and nutrition discussed.  Continue atorvastatin 80 mg daily. The 10-year ASCVD risk score Mikey Bussing DC Brooke Bonito., et al., 2013) is: 30.1%   Values used to calculate the score:     Age: 70 years     Sex: Male     Is Non-Hispanic African American: Yes     Diabetic: Yes     Tobacco smoker: No     Systolic Blood Pressure: [id number]     Is BP treated: Yes     HDL Cholesterol: 47 mg/dL     Total Cholesterol: 131 mg/dL

## 2021-03-29 NOTE — Patient Instructions (Signed)

## 2021-03-29 NOTE — Progress Notes (Signed)
Kenneth Velez 70 y.o.   Chief Complaint  Patient presents with   Hypertension    BP and diabetic follow up    HISTORY OF PRESENT ILLNESS: This is a 70 y.o. male with history of diabetes and hypertension here for follow-up. #1 diabetes: On Janumet 50-500 mg twice a day.  Doing well.  Stable. #2 hypertension: On diltiazem 180 mg daily. #3 history of sinus node dysfunction, on Tambocor 50 mg twice daily #4 dyslipidemia: On atorvastatin 80 mg daily. Scheduled to see cardiologist next October. Takes 1 baby aspirin daily. No other complaints or medical concerns today.  Hypertension Pertinent negatives include no chest pain, headaches, palpitations or shortness of breath.    Prior to Admission medications   Medication Sig Start Date End Date Taking? Authorizing Provider  aspirin EC 81 MG tablet Take 1 tablet (81 mg total) by mouth daily. 02/25/19  Yes Turner, Eber Hong, MD  atorvastatin (LIPITOR) 80 MG tablet TAKE 1 TABLET(80 MG) BY MOUTH DAILY 06/15/20  Yes Imogene Burn, PA-C  diltiazem (CARDIZEM CD) 180 MG 24 hr capsule TAKE 1 CAPSULE(180 MG) BY MOUTH DAILY 09/15/20  Yes Sueanne Margarita, MD  flecainide (TAMBOCOR) 50 MG tablet Take 1 tablet by mouth twice daily 08/28/20  Yes Evans Lance, MD  tadalafil (CIALIS) 20 MG tablet Take 20 mg by mouth every other day. 07/30/19  Yes [provider]  sitaGLIPtin-metformin (JANUMET) 50-500 MG tablet Take 1 tablet by mouth 2 (two) times daily with a meal. 09/28/20 12/27/20  Horald Pollen, MD    No Known Allergies  Patient Active Problem List   Diagnosis Date Noted   Type 2 diabetes mellitus with hyperglycemia, without long-term current use of insulin ([place]) 06/23/2020   Interstitial pulmonary disease ([place]) 06/23/2020   Hypertension associated with diabetes ([place]) 10/08/2019   Sinus node dysfunction (H[facility]) 10/08/2019   SVT (supraventricular tachycardia) ([place]) 07/18/2018   Wide-complex tachycardia ([place]) 07/18/2018    Exertional angina ([place]) 06/04/2018   Malignant neoplasm of prostate ([place]) 04/20/2015   Osteoarthritis of left hip 09/21/2012    Past Medical History:  Diagnosis Date   Arthritis    Malignant neoplasm of prostate ([place]) 04/20/2015   Osteoarthritis of left hip 09/21/2012   Prostate cancer [facility])     Past Surgical History:  Procedure Laterality Date   JOINT REPLACEMENT     PROSTATE BIOPSY     TOTAL HIP ARTHROPLASTY Left 09/21/2012   Dr French Ana   TOTAL HIP ARTHROPLASTY Left 09/21/2012   Procedure: TOTAL HIP ARTHROPLASTY;  Surgeon: Yvette Rack., MD;  Location: [place];  Service: Orthopedics;  Laterality: Left;    Social History   Socioeconomic History   Marital status: Married    Spouse name: Not on file   Number of children: Not on file   Years of education: Not on file   Highest education level: Not on file  Occupational History   Not on file  Tobacco Use   Smoking status: Never   Smokeless tobacco: Never  Vaping Use   Vaping Use: Never used  Substance and Sexual Activity   Alcohol use: No    Comment: occasional   Drug use: No   Sexual activity: Yes  Other Topics Concern   Not on file  Social History Narrative   Not on file   Social Determinants of Health   Financial Resource Strain: Not on file  Food Insecurity: Not on file  Transportation Needs: Not on file  Physical Activity:  Not on file  Stress: Not on file  Social Connections: Not on file  Intimate Partner Violence: Not on file    Family History  Problem Relation Age of Onset   Diabetes Mother    Colon cancer Father    Pancreatic cancer Neg Hx    Rectal cancer Neg Hx    Stomach cancer Neg Hx    Esophageal cancer Neg Hx    Cancer Neg Hx      Review of Systems  Constitutional: Negative.  Negative for chills and fever.  HENT: Negative.  Negative for congestion and sore throat.   Respiratory: Negative.  Negative for cough and shortness of breath.   Cardiovascular: Negative.  Negative for chest pain  and palpitations.  Gastrointestinal:  Negative for abdominal pain, diarrhea, nausea and vomiting.  Genitourinary: Negative.  Negative for dysuria and hematuria.  Skin: Negative.  Negative for rash.  Neurological: Negative.  Negative for dizziness and headaches.  All other systems reviewed and are negative.  Today's Vitals   03/29/21 1030  BP: (!) 156/78  Temp: 98 F (36.7 C)  TempSrc: Oral  SpO2: 98%  Weight: 174 lb (78.9 kg)  Height: '5\' 5"'$  (1.651 m)   Body mass index is 28.96 kg/m. Wt Readings from Last 3 Encounters:  03/29/21 174 lb (78.9 kg)  09/28/20 178 lb (80.7 kg)  06/23/20 177 lb (80.3 kg)    Physical Exam Vitals reviewed.  Constitutional:      Appearance: Normal appearance.  HENT:     Head: Normocephalic.     Right Ear: Tympanic membrane, ear canal and external ear normal.     Left Ear: Tympanic membrane, ear canal and external ear normal.     Mouth/Throat:     Mouth: Mucous membranes are moist.     Pharynx: Oropharynx is clear.  Eyes:     Extraocular Movements: Extraocular movements intact.     Conjunctiva/sclera: Conjunctivae normal.     Pupils: Pupils are equal, round, and reactive to light.  Neck:     Vascular: No carotid bruit.  Cardiovascular:     Rate and Rhythm: Normal rate and regular rhythm.     Pulses: Normal pulses.     Heart sounds: Normal heart sounds.  Pulmonary:     Effort: Pulmonary effort is normal.     Breath sounds: Normal breath sounds.  Abdominal:     Palpations: Abdomen is soft.     Tenderness: There is no abdominal tenderness.  Musculoskeletal:        General: Normal range of motion.     Cervical back: Normal range of motion and neck supple. No tenderness.  Lymphadenopathy:     Cervical: No cervical adenopathy.  Skin:    General: Skin is warm and dry.     Capillary Refill: Capillary refill takes less than 2 seconds.  Neurological:     General: No focal deficit present.     Mental Status: He is alert and oriented to person,  place, and time.  Psychiatric:        Mood and Affect: Mood normal.        Behavior: Behavior normal.    Results for orders placed or performed in visit on 03/29/21 (from the past 24 hour(s))  POCT glycosylated hemoglobin (Hb A1C)     Status: Abnormal   Collection Time: 03/29/21 10:34 AM  Result Value Ref Range   Hemoglobin A1C 6.2 (A) 4.0 - 5.6 %   HbA1c POC (<> result, manual entry)  HbA1c, POC (prediabetic range)     HbA1c, POC (controlled diabetic range)      ASSESSMENT & PLAN: Kyson was seen today for hypertension.  Diagnoses and all orders for this visit:  Hypertension associated with diabetes ([place]) -     Comprehensive metabolic panel -     Lipid panel  Type 2 diabetes mellitus with hyperglycemia, without long-term current use of insulin (HCC) -     POCT glycosylated hemoglobin (Hb A1C)  Need for influenza vaccination -     Flu Vaccine QUAD High Dose(Fluad)  Atherosclerosis of aorta (HCC)  Sinus node dysfunction (HCC)  Dyslipidemia associated with type 2 diabetes mellitus ([place])  Hypertension associated with diabetes ([place]) Well-controlled hypertension.  Did not take blood pressure medication this morning.  Normal blood pressure readings at home.  Dietary approaches to stop hypertension discussed. Well-controlled diabetes with hemoglobin A1c is 6.2. Lab Results  Component Value Date   HGBA1C 6.2 (A) 03/29/2021  Continue Janumet 50-500 mg twice a day.  Diet and nutrition discussed. Follow-up in 6 months.   Dyslipidemia associated with type 2 diabetes mellitus ([place]) Diet and nutrition discussed.  Continue atorvastatin 80 mg daily. The 10-year ASCVD risk score Mikey Bussing DC Brooke Bonito., et al., 2013) is: 30.1%   Values used to calculate the score:     Age: 25 years     Sex: Male     Is Non-Hispanic African American: Yes     Diabetic: Yes     Tobacco smoker: No     Systolic Blood Pressure: [id number]     Is BP treated: Yes     HDL Cholesterol: 47 mg/dL     Total  Cholesterol: 131 mg/dL  Patient Instructions  Hypertension, Adult High blood pressure (hypertension) is when the force of blood pumping through the arteries is too strong. The arteries are the blood vessels that carry blood from the heart throughout the body. Hypertension forces the heart to work harder to pump blood and may cause arteries to become narrow or stiff. Untreated or uncontrolled hypertension can cause a heart attack, heart failure, a stroke, kidney disease, and otherproblems. A blood pressure reading consists of a higher number over a lower number. Ideally, your blood pressure should be below 120/80. The first ("top") number is called the systolic pressure. It is a measure of the pressure in your arteries as your heart beats. The second ("bottom") number is called the diastolic pressure. It is a measure of the pressure in your arteries as theheart relaxes. What are the causes? The exact cause of this condition is not known. There are some conditions thatresult in or are related to high blood pressure. What increases the risk? Some risk factors for high blood pressure are under your control. The following factors may make you more likely to develop this condition: Smoking. Having type 2 diabetes mellitus, high cholesterol, or both. Not getting enough exercise or physical activity. Being overweight. Having too much fat, sugar, calories, or salt (sodium) in your diet. Drinking too much alcohol. Some risk factors for high blood pressure may be difficult or impossible to change. Some of these factors include: Having chronic kidney disease. Having a family history of high blood pressure. Age. Risk increases with age. Race. You may be at higher risk if you are African American. Gender. Men are at higher risk than women before age 20. After age 54, women are at higher risk than men. Having obstructive sleep apnea. Stress. What are the signs or symptoms? High  blood pressure may not cause  symptoms. Very high blood pressure (hypertensive crisis) may cause: Headache. Anxiety. Shortness of breath. Nosebleed. Nausea and vomiting. Vision changes. Severe chest pain. Seizures. How is this diagnosed? This condition is diagnosed by measuring your blood pressure while you are seated, with your arm resting on a flat surface, your legs uncrossed, and your feet flat on the floor. The cuff of the blood pressure monitor will be placed directly against the skin of your upper arm at the level of your heart. It should be measured at least twice using the same arm. Certain conditions cancause a difference in blood pressure between your right and left arms. Certain factors can cause blood pressure readings to be lower or higher than normal for a short period of time: When your blood pressure is higher when you are in a health care provider's office than when you are at home, this is called white coat hypertension. Most people with this condition do not need medicines. When your blood pressure is higher at home than when you are in a health care provider's office, this is called masked hypertension. Most people with this condition may need medicines to control blood pressure. If you have a high blood pressure reading during one visit or you have normal blood pressure with other risk factors, you may be asked to: Return on a different day to have your blood pressure checked again. Monitor your blood pressure at home for 1 week or longer. If you are diagnosed with hypertension, you may have other blood or imaging tests to help your health care provider understand your overall risk for otherconditions. How is this treated? This condition is treated by making healthy lifestyle changes, such as eating healthy foods, exercising more, and reducing your alcohol intake. Your health care provider may prescribe medicine if lifestyle changes are not enough to get your blood pressure under control, and if: Your  systolic blood pressure is above 130. Your diastolic blood pressure is above 80. Your personal target blood pressure may vary depending on your medicalconditions, your age, and other factors. Follow these instructions at home: Eating and drinking  Eat a diet that is high in fiber and potassium, and low in sodium, added sugar, and fat. An example eating plan is called the DASH (Dietary Approaches to Stop Hypertension) diet. To eat this way: Eat plenty of fresh fruits and vegetables. Try to fill one half of your plate at each meal with fruits and vegetables. Eat whole grains, such as whole-wheat pasta, brown rice, or whole-grain bread. Fill about one fourth of your plate with whole grains. Eat or drink low-fat dairy products, such as skim milk or low-fat yogurt. Avoid fatty cuts of meat, processed or cured meats, and poultry with skin. Fill about one fourth of your plate with lean proteins, such as fish, chicken without skin, beans, eggs, or tofu. Avoid pre-made and processed foods. These tend to be higher in sodium, added sugar, and fat. Reduce your daily sodium intake. Most people with hypertension should eat less than 1,500 mg of sodium a day. Do not drink alcohol if: Your health care provider tells you not to drink. You are pregnant, may be pregnant, or are planning to become pregnant. If you drink alcohol: Limit how much you use to: 0-1 drink a day for women. 0-2 drinks a day for men. Be aware of how much alcohol is in your drink. In the [place] one drink equals one 12 oz bottle of beer (355 mL),  one 5 oz glass of wine (148 mL), or one 1 oz glass of hard liquor (44 mL).  Lifestyle  Work with your health care provider to maintain a healthy body weight or to lose weight. Ask what an ideal weight is for you. Get at least 30 minutes of exercise most days of the week. Activities may include walking, swimming, or biking. Include exercise to strengthen your muscles (resistance exercise), such as  Pilates or lifting weights, as part of your weekly exercise routine. Try to do these types of exercises for 30 minutes at least 3 days a week. Do not use any products that contain nicotine or tobacco, such as cigarettes, e-cigarettes, and chewing tobacco. If you need help quitting, ask your health care provider. Monitor your blood pressure at home as told by your health care provider. Keep all follow-up visits as told by your health care provider. This is important.  Medicines Take over-the-counter and prescription medicines only as told by your health care provider. Follow directions carefully. Blood pressure medicines must be taken as prescribed. Do not skip doses of blood pressure medicine. Doing this puts you at risk for problems and can make the medicine less effective. Ask your health care provider about side effects or reactions to medicines that you should watch for. Contact a health care provider if you: Think you are having a reaction to a medicine you are taking. Have headaches that keep coming back (recurring). Feel dizzy. Have swelling in your ankles. Have trouble with your vision. Get help right away if you: Develop a severe headache or confusion. Have unusual weakness or numbness. Feel faint. Have severe pain in your chest or abdomen. Vomit repeatedly. Have trouble breathing. Summary Hypertension is when the force of blood pumping through your arteries is too strong. If this condition is not controlled, it may put you at risk for serious complications. Your personal target blood pressure may vary depending on your medical conditions, your age, and other factors. For most people, a normal blood pressure is less than 120/80. Hypertension is treated with lifestyle changes, medicines, or a combination of both. Lifestyle changes include losing weight, eating a healthy, low-sodium diet, exercising more, and limiting alcohol. This information is not intended to replace advice given  to you by your health care provider. Make sure you discuss any questions you have with your healthcare provider. Document Revised: 03/28/2018 Document Reviewed: 03/28/2018 Elsevier Patient Education  2022 [place], [place] [facility]

## 2021-04-02 ENCOUNTER — Other Ambulatory Visit: Payer: Self-pay | Admitting: Physician Assistant

## 2021-04-06 ENCOUNTER — Encounter: Payer: Self-pay | Admitting: Gastroenterology

## 2021-04-13 DIAGNOSIS — Z8546 Personal history of malignant neoplasm of prostate: Secondary | ICD-10-CM | POA: Diagnosis not present

## 2021-04-19 ENCOUNTER — Ambulatory Visit (INDEPENDENT_AMBULATORY_CARE_PROVIDER_SITE_OTHER): Payer: PPO | Admitting: Cardiology

## 2021-04-19 ENCOUNTER — Other Ambulatory Visit: Payer: Self-pay

## 2021-04-19 ENCOUNTER — Encounter: Payer: Self-pay | Admitting: Cardiology

## 2021-04-19 VITALS — BP 110/68 | HR 69 | Ht 65.0 in | Wt 176.0 lb

## 2021-04-19 DIAGNOSIS — I471 Supraventricular tachycardia: Secondary | ICD-10-CM | POA: Diagnosis not present

## 2021-04-19 DIAGNOSIS — I495 Sick sinus syndrome: Secondary | ICD-10-CM | POA: Diagnosis not present

## 2021-04-19 DIAGNOSIS — R072 Precordial pain: Secondary | ICD-10-CM | POA: Diagnosis not present

## 2021-04-19 DIAGNOSIS — I25118 Atherosclerotic heart disease of native coronary artery with other forms of angina pectoris: Secondary | ICD-10-CM

## 2021-04-19 DIAGNOSIS — E785 Hyperlipidemia, unspecified: Secondary | ICD-10-CM

## 2021-04-19 DIAGNOSIS — I10 Essential (primary) hypertension: Secondary | ICD-10-CM

## 2021-04-19 DIAGNOSIS — I2510 Atherosclerotic heart disease of native coronary artery without angina pectoris: Secondary | ICD-10-CM | POA: Insufficient documentation

## 2021-04-19 NOTE — Progress Notes (Signed)
Cardiology Office Note:    Date:  04/19/2021   ID:  Kenneth Velez, DOB Dec 27, 1950, MRN [id number]  PCP:  Horald Pollen, MD  Cardiologist:  Fransico Him, MD    Referring MD: Horald Pollen, *   Chief Complaint  Patient presents with   Coronary Artery Disease   Hypertension   Hyperlipidemia     History of Present Illness:    Kenneth Velez is a 70 y.o. male with a hx of mild ASCAD with coronary CTA showing mild CAD in the prox to mid RCA 25-49% and minimal calcified plaque in the ostial and mid LAD and prox LCX.  Calcium score was 23.  FFR showed no flow limiting lesion.  2D echo for CP and SOB showed normal LVEF 60 to 65% with mild LVH and grade 1 DD, nuclear stress test normal, 24-hour monitor showed sinus bradycardia, normal sinus rhythm and sinus tachycardia average heart rate 61 bpm range 41 to 231 bpm and SVT up to 7 beats at a time, frequent PACs and wide-complex tachycardia up to 11 beats with bigeminal PVCs with  PVC load less than 1%. He was placed on Toprol XL 50 mg in the morning '25mg'$  in the afternoon.   He continued to have atypical sharp stabbing CP in the back despite coronary CTA showing  mild CAD in the proximal to mid RCA 25 to 49% and minimal calcified plaque in the ostial and mid LAD and proximal circumflex.  Calcium score 23.  FFR no flow-limiting lesions.  CP was very atypical and felt not cardiac in origin.    He was referred to Pulmonary for evaluation which was unremarkable. His cardiopulmonary stress test showed that he may not be able to increase HR with exercise which may be attributing to his SOB. He was seen back by Estella Husk, PA in 07/2019 and he was complaining of continued SOB and CP.  His sx were felt to possibly be due to weight and anxiety.  He was also complaining severe fatigue and his Bystolic was stopped and started on Diltiazem for palpitations.  He was seen by Cristopher Peru, MD 08/22/2019 for ongoing palpitations.  His heart  monitor showed PVCs, NSVT and multiple atrial tachycardias.  He was started on Flecainide.  He is here today for followup and is doing well.  He has not had any more sharp pain but  occasionally will have chest pressure when it is cold outside with radiation into his back but no associated nausea or diaphoresis.  He denies any SOB, DOE (except in cold weather), PND, orthopnea, LE edema, dizziness, palpitations or syncope. He is compliant with his meds and is tolerating meds with no SE.     Past Medical History:  Diagnosis Date   Arthritis    CAD (coronary artery disease), native coronary artery    mild ASCAD with coronary CTA showing mild CAD in the prox to mid RCA 25-49% and minimal calcified plaque in the ostial and mid LAD and prox LCX.  Calcium score was 23.  FFR showed no flow limiting lesion.   HLD (hyperlipidemia)    HTN (hypertension), benign    Malignant neoplasm of prostate ([place]) 04/20/2015   Osteoarthritis of left hip 09/21/2012   Prostate [facility])     Past Surgical History:  Procedure Laterality Date   JOINT REPLACEMENT     PROSTATE BIOPSY     TOTAL HIP ARTHROPLASTY Left 09/21/2012   Dr French Ana   TOTAL HIP ARTHROPLASTY  Left 09/21/2012   Procedure: TOTAL HIP ARTHROPLASTY;  Surgeon: Yvette Rack., MD;  Location: [facility];  Service: Orthopedics;  Laterality: Left;    Current Medications: Current Meds  Medication Sig   aspirin EC 81 MG tablet Take 1 tablet (81 mg total) by mouth daily.   atorvastatin (LIPITOR) 80 MG tablet TAKE 1 TABLET(80 MG) BY MOUTH DAILY   diltiazem (CARDIZEM CD) 180 MG 24 hr capsule TAKE 1 CAPSULE(180 MG) BY MOUTH DAILY   flecainide (TAMBOCOR) 50 MG tablet Take 1 tablet by mouth twice daily   sitaGLIPtin-metformin (JANUMET) 50-500 MG tablet Take 1 tablet by mouth 2 (two) times daily with a meal.   tadalafil (CIALIS) 20 MG tablet Take 20 mg by mouth every other day.     Allergies:   Patient has no known allergies.   Social History   Socioeconomic  History   Marital status: Married    Spouse name: Not on file   Number of children: Not on file   Years of education: Not on file   Highest education level: Not on file  Occupational History   Not on file  Tobacco Use   Smoking status: Never   Smokeless tobacco: Never  Vaping Use   Vaping Use: Never used  Substance and Sexual Activity   Alcohol use: No    Comment: occasional   Drug use: No   Sexual activity: Yes  Other Topics Concern   Not on file  Social History Narrative   Not on file   Social Determinants of Health   Financial Resource Strain: Not on file  Food Insecurity: Not on file  Transportation Needs: Not on file  Physical Activity: Not on file  Stress: Not on file  Social Connections: Not on file     Family History: The patient's family history includes Colon cancer in his father; Diabetes in his mother. There is no history of Pancreatic cancer, Rectal cancer, Stomach cancer, Esophageal cancer, or Cancer.  ROS:   Please see the history of present illness.    ROS  All other systems reviewed and negative.   EKGs/Labs/Other Studies Reviewed:    The following studies were reviewed today: EKG. Nuclear stress test, coronary CTA< 2D echo  EKG:  EKG is  ordered today and showed NSR with no ST changes  Recent Labs: 03/29/2021: ALT 29; BUN 14; Creatinine, Ser 0.87; Potassium 4.5; Sodium 139   Recent Lipid Panel    Component Value Date/Time   CHOL 122 03/29/2021 1058   CHOL 131 03/23/2020 1245   TRIG 70.0 03/29/2021 1058   HDL 39.30 03/29/2021 1058   HDL 47 03/23/2020 1245   CHOLHDL 3 03/29/2021 1058   VLDL 14.0 03/29/2021 1058   LDLCALC 69 03/29/2021 1058   LDLCALC 71 03/23/2020 1245    Physical Exam:    VS:  BP 110/68   Pulse 69   Ht '5\' 5"'$  (1.651 m)   Wt 176 lb (79.8 kg)   SpO2 95%   BMI 29.29 kg/m     Wt Readings from Last 3 Encounters:  04/19/21 176 lb (79.8 kg)  03/29/21 174 lb (78.9 kg)  09/28/20 178 lb (80.7 kg)    GEN: Well  nourished, well developed in no acute distress HEENT: Normal NECK: No JVD; No carotid bruits LYMPHATICS: No lymphadenopathy CARDIAC:RRR, no murmurs, rubs, gallops RESPIRATORY:  Clear to auscultation without rales, wheezing or rhonchi  ABDOMEN: Soft, non-tender, non-distended MUSCULOSKELETAL:  No edema; No deformity  SKIN: Warm and dry  NEUROLOGIC:  Alert and oriented x 3 PSYCHIATRIC:  Normal affect   ASSESSMENT:    1. Coronary artery disease of native artery of native heart with stable angina pectoris ([place])   2. Essential hypertension   3. Hyperlipidemia LDL goal <70   4. SVT (supraventricular tachycardia) ([place])   5. Sinus node dysfunction (H[facility])    PLAN:    In order of problems listed above:  1.  ASCAD -coronary CTA showed mild CAD in the prox to mid RCA 25-49% and minimal calcified plaque in the ostial and mid LAD and prox LCX.  Calcium score was 23.  FFR showed no flow limiting lesion -he has chronic atypical CP that is sharp in his back and chest and not related to exertion with constant chest heaviness -  likely MSK with a high anxiety component was well.   -ETT in Feb 2021 normal -Cardiopulmonary stress test showed normal functional capacity with no cardiopulmonary limitation but did have a significant chronotropic incompetence and hypertensive response to exercise -Referred to pulmonary with negative workup -BB changed to CCB and started on  Flecainide for PVCs and PACS with resolution of his SOB -he has not had any further sharp CP but has pressure when he exerts himself in colder weathert -I will get a stress myoview to assess for ischemia -Shared Decision Making/Informed Consent The risks [chest pain, shortness of breath, cardiac arrhythmias, dizziness, blood pressure fluctuations, myocardial infarction, stroke/transient ischemic attack, nausea, vomiting, allergic reaction, radiation exposure, metallic taste sensation and life-threatening complications (estimated to be 1 in  10,000)], benefits (risk stratification, diagnosing coronary artery disease, treatment guidance) and alternatives of a nuclear stress test were discussed in detail with Mr. Hockey and he agrees to proceed. -continue ASA, statin and CCB  2.  HTN -BP controlled on exam today -Continue prescription drug management with Cardizem CD '180mg'$  daily>refilled    3.  HLD -LDL goal < 70 -I have personally reviewed and interpreted outside labs performed by patient's PCP which showed  LDL 69, HDL 39, TAG 70 and ALT 29 in Aug 2022 -Continue prescription drug management with Atorvastatin '80mg'$  daily>refilled    4.  SVT -fairly asymptomatic with minimal palpitations -Continue prescription drug management with Cardizem CD '180mg'$  daily and Flecainide '50mg'$  BID>refilled  -ETT in 09/2019 with no exercise induced arrhythmias  5.  Sinus node dysfunction -chronotropic incompetence on CPTX -no indication per EP for PPM at this time   Medication Adjustments/Labs and Tests Ordered: Current medicines are reviewed at length with the patient today.  Concerns regarding medicines are outlined above.  Orders Placed This Encounter  Procedures   EKG 12-Lead    No orders of the defined types were placed in this encounter.   Signed, Fransico Him, MD  04/19/2021 10:53 AM    [place]

## 2021-04-19 NOTE — Addendum Note (Signed)
Addended by: Antonieta Iba on: 04/19/2021 04:23 PM   Modules accepted: Orders

## 2021-04-19 NOTE — Addendum Note (Signed)
Addended by: Antonieta Iba on: 04/19/2021 11:02 AM   Modules accepted: Orders

## 2021-04-19 NOTE — Patient Instructions (Signed)
Medication Instructions:  Your physician recommends that you continue on your current medications as directed. Please refer to the Current Medication list given to you today.  *If you need a refill on your cardiac medications before your next appointment, please call your pharmacy*   Testing/Procedures: Your physician has recommended that you have a stress myoview.    Follow-Up: At [facility], you and your health needs are our priority.  As part of our continuing mission to provide you with exceptional heart care, we have created designated Provider Care Teams.  These Care Teams include your primary Cardiologist (physician) and Advanced Practice Providers (APPs -  Physician Assistants and Nurse Practitioners) who all work together to provide you with the care you need, when you need it.  Your next appointment:   1 year(s)  The format for your next appointment:   In Person  Provider:   You may see Fransico Him, MD or one of the following Advanced Practice Providers on your designated Care Team:   Melina Copa, PA-C Ermalinda Barrios, PA-C

## 2021-04-20 NOTE — Addendum Note (Signed)
Addended by: Sueanne Margarita on: 04/20/2021 08:43 AM   Modules accepted: Orders

## 2021-04-22 ENCOUNTER — Other Ambulatory Visit: Payer: Self-pay | Admitting: Physician Assistant

## 2021-04-23 ENCOUNTER — Other Ambulatory Visit: Payer: Self-pay | Admitting: Physician Assistant

## 2021-04-23 MED ORDER — DILTIAZEM HCL ER COATED BEADS 180 MG PO CP24: ORAL_CAPSULE | ORAL | 3 refills | Status: DC

## 2021-04-28 ENCOUNTER — Telehealth: Payer: Self-pay

## 2021-04-28 NOTE — Telephone Encounter (Signed)
Detailed instructions left on the patient's answering machine. Asked to call back with any questions. S.Dyamon Sosinski EMTP 

## 2021-04-29 ENCOUNTER — Ambulatory Visit (HOSPITAL_COMMUNITY): Payer: PPO | Attending: Cardiology

## 2021-04-29 ENCOUNTER — Other Ambulatory Visit: Payer: Self-pay

## 2021-04-29 DIAGNOSIS — R072 Precordial pain: Secondary | ICD-10-CM | POA: Diagnosis not present

## 2021-04-29 LAB — MYOCARDIAL PERFUSION IMAGING
Estimated workload: 10.1
Exercise duration (min): 8 min
Exercise duration (sec): 30 s
LV dias vol: 88 mL (ref 62–150)
LV sys vol: 31 mL
MPHR: 151 {beats}/min
Nuc Stress EF: 65 %
Peak HR: 129 {beats}/min
Percent HR: 86 %
RPE: 19
Rest HR: 55 {beats}/min
Rest Nuclear Isotope Dose: 10.7 mCi
SDS: 0
SRS: 0
SSS: 0
ST Depression (mm): 0 mm
Stress Nuclear Isotope Dose: 32.8 mCi
TID: 1.01

## 2021-04-29 MED ORDER — TECHNETIUM TC 99M TETROFOSMIN IV KIT
32.8000 | PACK | Freq: Once | INTRAVENOUS | Status: AC | PRN
  Administered 2021-04-29: 32.8 via INTRAVENOUS
  Filled 2021-04-29: qty 33

## 2021-04-29 MED ORDER — TECHNETIUM TC 99M TETROFOSMIN IV KIT
10.7000 | PACK | Freq: Once | INTRAVENOUS | Status: AC | PRN
  Administered 2021-04-29: 10.7 via INTRAVENOUS
  Filled 2021-04-29: qty 11

## 2021-04-29 MED ORDER — REGADENOSON 0.4 MG/5ML IV SOLN: 0.4000 mg | Freq: Once | INTRAVENOUS | Status: AC

## 2021-06-04 ENCOUNTER — Other Ambulatory Visit: Payer: Self-pay | Admitting: Internal Medicine

## 2021-06-27 NOTE — Progress Notes (Signed)
06/27/2021 Kenneth Velez [id number] 06-24-1951   CHIEF COMPLAINT: Schedule a colonoscopy   HISTORY OF PRESENT ILLNESS:  Kenneth Velez is a 70 year old male with a past medical history of hypertension, coronary artery disease, SVT and PVCs on Flecainide and Cardizem, hyperlipidemia, arthritis, prostate adenocarcinoma 2016 s/p radiation  Jan - March 2017 and colon polyps. He presents to our office today to schedule a colonoscopy.  He underwent a colonoscopy by Dr. Ardis Hughs 12/02/2012 which identified two 3 to 38mm tubular adenomatous polyps which were removed from the ascending and descending colon.  He was advised to repeat a colonoscopy in 5 years.  His father possibly had colon cancer, further details are unclear.  He reports passing a normal formed brown bowel movement once daily.  No rectal bleeding or black-colored stools.  No dysphagia or heartburn.    He has a history of nonobstructive CAD for coronary CTA.  He denies having any current chest pain, palpitations or shortness of breath.  However, he reports having mid chest pressure when it is cold outside (last episode occurred 1 or 2 weeks ago) which can last for several hours or day and is unrelated to exertion or activity for the past few years which has been followed by his cardiologist Dr. Fransico Him. He was last seen in office by Dr. Radford Pax on 04/19/2021 and he again mentioned having mid chest pressure which radiates to his back without associated nausea or diaphoresis. A Myoview exercise study was done 04/29/2021 which was normal, no evidence of ischemia.  Negative pulmonary evaluation 07/2019.   Myoview exercise stress test 04/29/2021:   Good exercise capacity, achieved 10.1 METS   Normal BP response to exercise   Max HR 130 bpm, 86% max age predicted HR   No EKG evidence of ischemia   Left ventricular function is normal.   LV perfusion is normal. There is no evidence of ischemia. There is no evidence of infarction.   The  study is normal. The study is low risk.  Coronary CT 02/08/2019: 1. Coronary calcium score of 23.9. This was 53rd percentile for age and sex matched control.   2. Normal coronary origin with right dominance.   3. Mild atherosclerosis of the RCA 25-49%.   4. Recommend aggressive risk factor modification.  ECHO 05/23/2019: 1. Left ventricular ejection fraction, by visual estimation, is 55 to 60%. The left ventricle has normal function. Normal left ventricular size. There is mildly increased left ventricular hypertrophy. 2. Left ventricular diastolic Doppler parameters are indeterminate pattern of LV diastolic filling. 3. Global right ventricle has normal systolic function.The right ventricular size is normal. No increase in right ventricular wall thickness. 4. Left atrial size was mildly dilated. 5. Right atrial size was normal. 6. Mild mitral annular calcification. 7. Moderate aortic valve annular calcification. 8. The mitral valve is normal in structure. Mild mitral valve regurgitation. No evidence of mitral stenosis. 9. The tricuspid valve is normal in structure. Tricuspid valve regurgitation is mild. 10. The aortic valve is normal in structure. Aortic valve regurgitation is trivial by color flow Doppler. Mild aortic valve sclerosis without stenosis. 11. There is Mild calcification of the aortic valve. 12. There is Mild thickening of the aortic valve. 13. The pulmonic valve was normal in structure. Pulmonic valve regurgitation is trivial by color flow Doppler. 14. Mildly elevated pulmonary artery systolic pressure. 15. The inferior vena cava is normal in size with greater than 50% respiratory variability, suggesting right atrial pressure of 3  mmHg  Colonoscopy 12/02/2012 by Dr. Ardis Hughs: Two polyps were found, removed and sent to pathology. The examination was otherwise normal. - 5 year colonoscopy recall Surgical [P], endoscopic procedure, ascending, descending, polyp (2) -  TUBULAR ADENOMA. NO HIGH GRADE DYSPLASIA OR MALIGNANCY IDENTIFIED. (TWO FRAGMENTS) CBC Latest Ref Rng & Units 03/23/2020 04/30/2019 05/25/2018  WBC 3.4 - 10.8 x10E3/uL 6.3 6.7 5.3  Hemoglobin 13.0 - 17.7 g/dL 13.6 14.0 13.8  Hematocrit 37.5 - 51.0 % 41.0 42.0 42.5  Platelets 150 - 450 x10E3/uL 243 245.0 341     CMP Latest Ref Rng & Units 03/29/2021 09/28/2020 03/23/2020  Glucose 70 - 99 mg/dL 124(H) 110(H) 258(H)  BUN 6 - 23 mg/dL 14 11 16   Creatinine 0.40 - 1.50 mg/dL 0.87 0.81 0.81  Sodium 135 - 145 mEq/L 139 140 140  Potassium 3.5 - 5.1 mEq/L 4.5 5.2 4.4  Chloride 96 - 112 mEq/L 105 102 105  CO2 19 - 32 mEq/L 26 23 21   Calcium 8.4 - 10.5 mg/dL 9.3 9.7 9.6  Total Protein 6.0 - 8.3 g/dL 7.3 7.2 6.8  Total Bilirubin 0.2 - 1.2 mg/dL 0.5 0.4 0.3  Alkaline Phos 39 - 117 U/L 50 58 74  AST 0 - 37 U/L 20 18 31   ALT 0 - 53 U/L 29 20 34    Past Medical History:  Diagnosis Date   Arthritis    CAD (coronary artery disease), native coronary artery    mild ASCAD with coronary CTA showing mild CAD in the prox to mid RCA 25-49% and minimal calcified plaque in the ostial and mid LAD and prox LCX.  Calcium score was 23.  FFR showed no flow limiting lesion.   HLD (hyperlipidemia)    HTN (hypertension), benign    Malignant neoplasm of prostate ([place]) 04/20/2015   Osteoarthritis of left hip 09/21/2012   Prostate cancer [facility])    Past Surgical History:  Procedure Laterality Date   JOINT REPLACEMENT     PROSTATE BIOPSY     TOTAL HIP ARTHROPLASTY Left 09/21/2012   Dr French Ana   TOTAL HIP ARTHROPLASTY Left 09/21/2012   Procedure: TOTAL HIP ARTHROPLASTY;  Surgeon: Yvette Rack., MD;  Location: [facility];  Service: Orthopedics;  Laterality: Left;   Social History: He is originally from [place].  Nonsmoker. He drinks one glass of wine or beer on the weekends. No drug use.   Family History: Mother deceased with questionable liver disease and diabetes. Father deceased possibly had colon cancer.   No Known  Allergies    Outpatient Encounter Medications as of 06/28/2021  Medication Sig   aspirin EC 81 MG tablet Take 1 tablet (81 mg total) by mouth daily.   atorvastatin (LIPITOR) 80 MG tablet TAKE 1 TABLET(80 MG) BY MOUTH DAILY   diltiazem (CARDIZEM CD) 180 MG 24 hr capsule TAKE 1 CAPSULE(180 MG) BY MOUTH DAILY   flecainide (TAMBOCOR) 50 MG tablet Take 1 tablet by mouth twice daily   sitaGLIPtin-metformin (JANUMET) 50-500 MG tablet Take 1 tablet by mouth 2 (two) times daily with a meal.   tadalafil (CIALIS) 20 MG tablet Take 20 mg by mouth every other day.   Facility-Administered Encounter Medications as of 06/28/2021  Medication   regadenoson (LEXISCAN) injection SOLN 0.4 mg    REVIEW OF SYSTEMS:  Gen: Denies fever, sweats or chills. No weight loss.  CV:See HPI. No edema.  Resp: Denies cough, shortness of breath of hemoptysis.  GI: See HPI.   GU : Denies urinary burning, blood  in urine, increased urinary frequency or incontinence. MS: Denies joint pain, muscles aches or weakness. Derm: Denies rash, itchiness, skin lesions or unhealing ulcers. Psych: Denies depression, anxiety or memory loss. Heme: Denies bruising, bleeding. Neuro:  Denies headaches, dizziness or paresthesias. Endo:  + DM II.   PHYSICAL EXAM: There were no vitals taken for this visit. BP 120/60   Pulse (!) 57   Ht 5\' 5"  (1.651 m)   Wt 176 lb (79.8 kg)   SpO2 98%   BMI 29.29 kg/m   General: 70 year old male in NAD.  Head: Normocephalic and atraumatic. Eyes:  Sclerae non-icteric, conjunctive pink. Ears: Normal auditory acuity. Mouth: Dentition intact. No ulcers or lesions.  Neck: Supple, no lymphadenopathy or thyromegaly.  Lungs: Clear bilaterally to auscultation without wheezes, crackles or rhonchi. Heart: Regular rate and rhythm. No murmur, rub or gallop appreciated.  Abdomen: Soft, nontender, non distended. No masses. No hepatosplenomegaly. Normoactive bowel sounds x 4 quadrants.  Rectal:   Musculoskeletal: Symmetrical with no gross deformities. Skin: Warm and dry. No rash or lesions on visible extremities. Extremities: No edema. Neurological: Alert oriented x 4, no focal deficits.  Psychological:  Alert and cooperative. Normal mood and affect.  ASSESSMENT AND PLAN:  29) 70 year old male with a history of tubular adenomatous colon polyps per colonoscopy in 2015. Father with possible history of colon cancer.  -Colonoscopy benefits and risks discussed including risk with sedation, risk of bleeding, perforation and infection  -Further recommendations to be determined after colonoscopy completed  2) Coronary artery disease per coronary CT. Atypical CP which occurs during times of cold temperatures, thought to be musculoskeletal +/- anxiety component and not cardiac per cardiologist Dr. Fransico Him. Myoview exercise stress test 04/29/2021 without evidence of ischemia.  Negative pulmonary evaluation 07/2019.  3) DM II  4) History of prostate cancer s/p radiation     CC:  Horald Pollen, *

## 2021-06-28 ENCOUNTER — Encounter: Payer: Self-pay | Admitting: Nurse Practitioner

## 2021-06-28 ENCOUNTER — Ambulatory Visit (INDEPENDENT_AMBULATORY_CARE_PROVIDER_SITE_OTHER): Payer: PPO | Admitting: Nurse Practitioner

## 2021-06-28 VITALS — BP 120/60 | HR 57 | Ht 65.0 in | Wt 176.0 lb

## 2021-06-28 DIAGNOSIS — I2510 Atherosclerotic heart disease of native coronary artery without angina pectoris: Secondary | ICD-10-CM

## 2021-06-28 DIAGNOSIS — Z86010 Personal history of colonic polyps: Secondary | ICD-10-CM

## 2021-06-28 DIAGNOSIS — E1169 Type 2 diabetes mellitus with other specified complication: Secondary | ICD-10-CM | POA: Diagnosis not present

## 2021-06-28 DIAGNOSIS — E785 Hyperlipidemia, unspecified: Secondary | ICD-10-CM

## 2021-06-28 MED ORDER — SUPREP BOWEL PREP KIT 17.5-3.13-1.6 GM/177ML PO SOLN: 1.0000 | ORAL | 0 refills | Status: DC

## 2021-06-28 NOTE — Patient Instructions (Signed)
If you are age 69 or older, your body mass index should be between 23-30. Your Body mass index is 29.29 kg/m. If this is out of the aforementioned range listed, please consider follow up with your Primary Care Provider.  The [facility] providers would like to encourage you to use [facility] to communicate with providers for non-urgent requests or questions.  Due to long hold times on the telephone, sending your provider a message by [facility] may be faster and more efficient way to get a response. Please allow 48 business hours for a response.  Please remember that this is for non-urgent requests/questions.  PROCEDURES: You have been scheduled for a colonoscopy. Please follow the written instructions given to you at your visit today. Please pick up your prep supplies at the pharmacy within the next 1-3 days. If you use inhalers (even only as needed), please bring them with you on the day of your procedure.  It was great seeing you today! Thank you for entrusting me with your care and choosing [facility].  Noralyn Pick, CRNP

## 2021-06-29 NOTE — Progress Notes (Signed)
Sounds very atypical for cardiac type chest pain.  It looks like his cardiologist agrees.  I think he is safe without formal cardiology clearance for a colonoscopy.  Thank you

## 2021-07-07 DIAGNOSIS — Z9889 Other specified postprocedural states: Secondary | ICD-10-CM | POA: Diagnosis not present

## 2021-07-07 DIAGNOSIS — H2513 Age-related nuclear cataract, bilateral: Secondary | ICD-10-CM | POA: Diagnosis not present

## 2021-07-08 ENCOUNTER — Other Ambulatory Visit: Payer: Self-pay | Admitting: Physician Assistant

## 2021-08-17 ENCOUNTER — Telehealth: Payer: Self-pay | Admitting: Gastroenterology

## 2021-08-17 NOTE — Telephone Encounter (Signed)
Patient called to cancel procedure for tomorrow.  He said he forgot about it and did not follow the prep instructions.  He has rescheduled for 09/21/21 at 1:30 p.m.  Thank you.

## 2021-08-18 ENCOUNTER — Encounter: Payer: PPO | Admitting: Gastroenterology

## 2021-09-21 ENCOUNTER — Ambulatory Visit (AMBULATORY_SURGERY_CENTER): Payer: Medicare Other | Admitting: Gastroenterology

## 2021-09-21 ENCOUNTER — Encounter: Payer: Self-pay | Admitting: Gastroenterology

## 2021-09-21 ENCOUNTER — Other Ambulatory Visit: Payer: Self-pay

## 2021-09-21 VITALS — BP 115/54 | HR 63 | Temp 98.6°F | Resp 15 | Ht 65.0 in | Wt 176.0 lb

## 2021-09-21 DIAGNOSIS — Z86010 Personal history of colonic polyps: Secondary | ICD-10-CM

## 2021-09-21 MED ORDER — SODIUM CHLORIDE 0.9 % IV SOLN: 500.0000 mL | Freq: Once | INTRAVENOUS | Status: DC

## 2021-09-21 NOTE — Progress Notes (Signed)
PT taken to PACU. Monitors in place. VSS. Report given to RN. 

## 2021-09-21 NOTE — Progress Notes (Signed)
°  Vitals-AG  History reviewed.

## 2021-09-21 NOTE — Progress Notes (Signed)
HPI: This is a man with h/o polyps, his father had CRC  Colonoscopy 11/2012 Dr. Ardis Hughs found two subCM adenomas   ROS: complete GI ROS as described in HPI, all other review negative.  Constitutional:  No unintentional weight loss   Past Medical History:  Diagnosis Date   Arthritis    CAD (coronary artery disease), native coronary artery    mild ASCAD with coronary CTA showing mild CAD in the prox to mid RCA 25-49% and minimal calcified plaque in the ostial and mid LAD and prox LCX.  Calcium score was 23.  FFR showed no flow limiting lesion.   HLD (hyperlipidemia)    HTN (hypertension), benign    Malignant neoplasm of prostate ([place]) 04/20/2015   Osteoarthritis of left hip 09/21/2012   Prostate cancer [facility])     Past Surgical History:  Procedure Laterality Date   JOINT REPLACEMENT     PROSTATE BIOPSY     TOTAL HIP ARTHROPLASTY Left 09/21/2012   Dr French Ana   TOTAL HIP ARTHROPLASTY Left 09/21/2012   Procedure: TOTAL HIP ARTHROPLASTY;  Surgeon: Yvette Rack., MD;  Location: [place];  Service: Orthopedics;  Laterality: Left;    Current Outpatient Medications  Medication Sig Dispense Refill   aspirin EC 81 MG tablet Take 1 tablet (81 mg total) by mouth daily. 90 tablet 3   atorvastatin (LIPITOR) 80 MG tablet Take 1 tablet by mouth once daily 90 tablet 0   diltiazem (CARDIZEM CD) 180 MG 24 hr capsule TAKE 1 CAPSULE(180 MG) BY MOUTH DAILY 90 capsule 3   flecainide (TAMBOCOR) 50 MG tablet Take 1 tablet by mouth twice daily 180 tablet 1   sitaGLIPtin-metformin (JANUMET) 50-500 MG tablet Take 1 tablet by mouth 2 (two) times daily with a meal. 180 tablet 3   tadalafil (CIALIS) 20 MG tablet Take 20 mg by mouth every other day.     No current facility-administered medications for this visit.   Facility-Administered Medications Ordered in Other Visits  Medication Dose Route Frequency Provider Last Rate Last Admin   regadenoson (LEXISCAN) injection SOLN 0.4 mg  0.4 mg Intravenous Once  Donato Heinz, MD        Allergies as of 09/21/2021   (No Known Allergies)    Family History  Problem Relation Age of Onset   Diabetes Mother    Colon cancer Father    Pancreatic cancer Neg Hx    Rectal cancer Neg Hx    Stomach cancer Neg Hx    Esophageal cancer Neg Hx    Cancer Neg Hx     Social History   Socioeconomic History   Marital status: Married    Spouse name: Not on file   Number of children: Not on file   Years of education: Not on file   Highest education level: Not on file  Occupational History   Not on file  Tobacco Use   Smoking status: Never   Smokeless tobacco: Never  Vaping Use   Vaping Use: Never used  Substance and Sexual Activity   Alcohol use: No    Comment: occasional   Drug use: No   Sexual activity: Yes  Other Topics Concern   Not on file  Social History Narrative   Not on file   Social Determinants of Health   Financial Resource Strain: Not on file  Food Insecurity: Not on file  Transportation Needs: Not on file  Physical Activity: Not on file  Stress: Not on file  Social Connections:  Not on file  Intimate Partner Violence: Not on file     Physical Exam: BP (!) 159/77 (BP Location: Right Arm, Patient Position: Sitting, Cuff Size: Normal)    Pulse 72    Temp 98.6 F (37 C) (Temporal)    Ht 5\' 5"  (1.651 m)    Wt 176 lb (79.8 kg)    SpO2 99%    BMI 29.29 kg/m  Constitutional: generally well-appearing Psychiatric: alert and oriented x3 Lungs: CTA bilaterally Heart: no MCR  Assessment and plan: 71 y.o. male with h/o polyps, FH CRC  Surveillance colonsocopy today  Care is appropriate for the ambulatory setting.  [name], MD [facility] 09/21/2021, 1:11 PM

## 2021-09-21 NOTE — Op Note (Signed)
[place] Patient Name: Kenneth Velez Procedure Date: 09/21/2021 1:24 PM MRN: [id number] Endoscopist: Milus Banister , MD Age: 71 Referring MD:  Date of Birth: May 16, 1951 Gender: Male Account #: [id number] Procedure:                Colonoscopy Indications:              High risk colon cancer surveillance: Personal                            history of colonic polyps; Colonoscopy 11/2012 Dr.                            Ardis Hughs found two subCM adenomas; also father had                            colon cancer Medicines:                Monitored Anesthesia Care Procedure:                Pre-Anesthesia Assessment:                           - Prior to the procedure, a History and Physical                            was performed, and patient medications and                            allergies were reviewed. The patient's tolerance of                            previous anesthesia was also reviewed. The risks                            and benefits of the procedure and the sedation                            options and risks were discussed with the patient.                            All questions were answered, and informed consent                            was obtained. Prior Anticoagulants: The patient has                            taken no previous anticoagulant or antiplatelet                            agents. ASA Grade Assessment: III - A patient with                            severe systemic disease. After reviewing the risks  and benefits, the patient was deemed in                            satisfactory condition to undergo the procedure.                           After obtaining informed consent, the colonoscope                            was passed under direct vision. Throughout the                            procedure, the patient's blood pressure, pulse, and                            oxygen saturations were monitored continuously. The                             Olympus CF-HQ190L [id number]) Colonoscope was                            introduced through the anus and advanced to the the                            cecum, identified by appendiceal orifice and                            ileocecal valve. The colonoscopy was performed                            without difficulty. The patient tolerated the                            procedure well. The quality of the bowel                            preparation was good. The ileocecal valve,                            appendiceal orifice, and rectum were photographed. Scope In: 1:27:46 PM Scope Out: 1:40:40 PM Scope Withdrawal Time: 0 hours 10 minutes 14 seconds  Total Procedure Duration: 0 hours 12 minutes 54 seconds  Findings:                 External and internal hemorrhoids were found. The                            hemorrhoids were small.                           The exam was otherwise without abnormality on                            direct and retroflexion views. Complications:            No  immediate complications. Estimated blood loss:                            None. Estimated Blood Loss:     Estimated blood loss: none. Impression:               - External and internal hemorrhoids.                           - The examination was otherwise normal on direct                            and retroflexion views.                           - No polyps or cancers. Recommendation:           - Patient has a contact number available for                            emergencies. The signs and symptoms of potential                            delayed complications were discussed with the                            patient. Return to normal activities tomorrow.                            Written discharge instructions were provided to the                            patient.                           - Resume previous diet.                           - Continue present medications.                            - Repeat colonoscopy in 5 years for screening. Milus Banister, MD 09/21/2021 1:43:28 PM This report has been signed electronically.

## 2021-09-21 NOTE — Patient Instructions (Signed)
Repeat colonoscopy in 5 years for screening   YOU HAD AN ENDOSCOPIC PROCEDURE TODAY AT [facility]:   Refer to the procedure report that was given to you for any specific questions about what was found during the examination.  If the procedure report does not answer your questions, please call your gastroenterologist to clarify.  If you requested that your care partner not be given the details of your procedure findings, then the procedure report has been included in a sealed envelope for you to review at your convenience later.  YOU SHOULD EXPECT: Some feelings of bloating in the abdomen. Passage of more gas than usual.  Walking can help get rid of the air that was put into your GI tract during the procedure and reduce the bloating. If you had a lower endoscopy (such as a colonoscopy or flexible sigmoidoscopy) you may notice spotting of blood in your stool or on the toilet paper. If you underwent a bowel prep for your procedure, you may not have a normal bowel movement for a few days.  Please Note:  You might notice some irritation and congestion in your nose or some drainage.  This is from the oxygen used during your procedure.  There is no need for concern and it should clear up in a day or so.  SYMPTOMS TO REPORT IMMEDIATELY:  Following lower endoscopy (colonoscopy or flexible sigmoidoscopy):  Excessive amounts of blood in the stool  Significant tenderness or worsening of abdominal pains  Swelling of the abdomen that is new, acute  Fever of 100F or higher   For urgent or emergent issues, a gastroenterologist can be reached at any hour by calling [phone]. Do not use MyChart messaging for urgent concerns.    DIET:  We do recommend a small meal at first, but then you may proceed to your regular diet.  Drink plenty of fluids but you should avoid alcoholic beverages for 24 hours.  ACTIVITY:  You should plan to take it easy for the rest of today and you should NOT DRIVE  or use heavy machinery until tomorrow (because of the sedation medicines used during the test).    FOLLOW UP: Our staff will call the number listed on your records 48-72 hours following your procedure to check on you and address any questions or concerns that you may have regarding the information given to you following your procedure. If we do not reach you, we will leave a message.  We will attempt to reach you two times.  During this call, we will ask if you have developed any symptoms of COVID 19. If you develop any symptoms (ie: fever, flu-like symptoms, shortness of breath, cough etc.) before then, please call [phone].  If you test positive for Covid 19 in the 2 weeks post procedure, please call and report this information to [place].    If any biopsies were taken you will be contacted by phone or by letter within the next 1-3 weeks.  Please call us at [phone] if you have not heard about the biopsies in 3 weeks.    SIGNATURES/CONFIDENTIALITY: You and/or your care partner have signed paperwork which will be entered into your electronic medical record.  These signatures attest to the fact that that the information above on your After Visit Summary has been reviewed and is understood.  Full responsibility of the confidentiality of this discharge information lies with you and/or your care-partner.

## 2021-09-23 ENCOUNTER — Telehealth: Payer: Self-pay | Admitting: *Deleted

## 2021-09-23 NOTE — Telephone Encounter (Signed)
°  Follow up Call-  Call back number 09/21/2021  Post procedure Call Back phone  # [phone]  Permission to leave phone message Yes  Some recent data might be hidden     Patient questions:  Do you have a fever, pain , or abdominal swelling? No. Pain Score  0 *  Have you tolerated food without any problems? Yes.    Have you been able to return to your normal activities? Yes.    Do you have any questions about your discharge instructions: Diet   No. Medications  No. Follow up visit  No.  Do you have questions or concerns about your Care? No.  Actions: * If pain score is 4 or above: No action needed, pain <4.  Have you developed a fever since your procedure? no  2.   Have you had an respiratory symptoms (SOB or cough) since your procedure? no  3.   Have you tested positive for COVID 19 since your procedure no  4.   Have you had any family members/close contacts diagnosed with the COVID 19 since your procedure?  no   If yes to any of these questions please route to Joylene John, RN and Joella Prince, RN

## 2021-10-06 ENCOUNTER — Telehealth: Payer: Self-pay | Admitting: Gastroenterology

## 2021-10-06 NOTE — Telephone Encounter (Signed)
Left message for patient to call back  

## 2021-10-06 NOTE — Telephone Encounter (Signed)
Patient recently had colonoscopy at the end of February.  He said for the first 2-3 days he felt fine, but now he is experiencing severe abdominal pain.  Please call and advise.  Thank you. ?

## 2021-10-07 ENCOUNTER — Other Ambulatory Visit: Payer: Self-pay | Admitting: Emergency Medicine

## 2021-10-07 ENCOUNTER — Other Ambulatory Visit: Payer: Self-pay | Admitting: Cardiology

## 2021-10-07 DIAGNOSIS — E1165 Type 2 diabetes mellitus with hyperglycemia: Secondary | ICD-10-CM

## 2021-10-07 NOTE — Telephone Encounter (Signed)
Patient called in with complaints of severe abdominal pain, nausea, and belching. He stated his pain started 4 days after his colonoscopy that was on 09/21/21. He is having daily bowel movements, however he feels constipated. He has not taken anything for the pain yet. Patient advised to go to urgent care/ED if pain worsens.  ? ?Dr. Ardis Hughs, please advise. Thank you! ?

## 2021-10-07 NOTE — Telephone Encounter (Signed)
Spoke with patient regarding Dr. Ardis Hughs recommendations. He is scheduled for the first available OV which is 11/29/21 at 3:20 pm. He will be added to wait list & contacted if sooner date becomes available.  ?

## 2021-11-29 ENCOUNTER — Other Ambulatory Visit (INDEPENDENT_AMBULATORY_CARE_PROVIDER_SITE_OTHER): Payer: Medicare Other

## 2021-11-29 ENCOUNTER — Ambulatory Visit (INDEPENDENT_AMBULATORY_CARE_PROVIDER_SITE_OTHER): Payer: Medicare Other | Admitting: Gastroenterology

## 2021-11-29 ENCOUNTER — Encounter: Payer: Self-pay | Admitting: Gastroenterology

## 2021-11-29 DIAGNOSIS — R1030 Lower abdominal pain, unspecified: Secondary | ICD-10-CM

## 2021-11-29 DIAGNOSIS — R1013 Epigastric pain: Secondary | ICD-10-CM

## 2021-11-29 LAB — COMPREHENSIVE METABOLIC PANEL
ALT: 18 U/L (ref 0–53)
AST: 14 U/L (ref 0–37)
Albumin: 4.6 g/dL (ref 3.5–5.2)
Alkaline Phosphatase: 49 U/L (ref 39–117)
BUN: 14 mg/dL (ref 6–23)
CO2: 27 mEq/L (ref 19–32)
Calcium: 9.9 mg/dL (ref 8.4–10.5)
Chloride: 105 mEq/L (ref 96–112)
Creatinine, Ser: 0.87 mg/dL (ref 0.40–1.50)
GFR: 87.58 mL/min (ref >60.00)
Glucose, Bld: 107 mg/dL — ABNORMAL HIGH (ref 70–99)
Potassium: 4.3 mEq/L (ref 3.5–5.1)
Sodium: 140 mEq/L (ref 135–145)
Total Bilirubin: 0.3 mg/dL (ref 0.2–1.2)
Total Protein: 7.5 g/dL (ref 6.0–8.3)

## 2021-11-29 LAB — CBC
HCT: 39.5 % (ref 39.0–52.0)
Hemoglobin: 12.9 g/dL — ABNORMAL LOW (ref 13.0–17.0)
MCHC: 32.6 g/dL (ref 30.0–36.0)
MCV: 86.8 fl (ref 78.0–100.0)
Platelets: 271 10*3/uL (ref 150.0–400.0)
RBC: 4.55 Mil/uL (ref 4.22–5.81)
RDW: 13.9 % (ref 11.5–15.5)
WBC: 8.1 10*3/uL (ref 4.0–10.5)

## 2021-11-29 NOTE — Patient Instructions (Signed)
If you are age 70 or older, your body mass index should be between 23-30. Your Body mass index is 29.29 kg/m?[name] Kitchen If this is out of the aforementioned range listed, please consider follow up with your Primary Care Provider. ?_______________________________________________________ ? ?The [facility] providers would like to encourage you to use [facility] to communicate with providers for non-urgent requests or questions.  Due to long hold times on the telephone, sending your provider a message by [facility] may be a faster and more efficient way to get a response.  Please allow 48 business hours for a response.  Please remember that this is for non-urgent requests.  ?_______________________________________________________ ? ?Your provider has requested that you go to the basement level for lab work before leaving today. Press "B" on the elevator. The lab is located at the first door on the left as you exit the elevator. ? ?Due to recent changes in healthcare laws, you may see the results of your imaging and laboratory studies on MyChart before your provider has had a chance to review them.  We understand that in some cases there may be results that are confusing or concerning to you. Not all laboratory results come back in the same time frame and the provider may be waiting for multiple results in order to interpret others.  Please give [place] 48 hours in order for your provider to thoroughly review all the results before contacting the office for clarification of your results.  ? ?You have been scheduled for a CT scan of the abdomen and pelvis at [facility], 1st floor Radiology. You are scheduled on 12-07-21  at 11:30am. You should arrive 30 minutes prior to your appointment time for registration. The solution may taste better if refrigerated, but do NOT add ice or any other liquid to this solution. Shake well before drinking.  ? ?Please follow the written instructions below on the day of your exam:  ? ?1) Do not eat  anything after 7:30am (4 hours prior to your test)  ? ?2) Drink 1 bottle of contrast @ 9:30am (2 hours prior to your exam)  Remember to shake well before drinking and do NOT pour over ice. ?    Drink 1 bottle of contrast @ 10:30am (1 hour prior to your exam)  ? ?You may take any medications as prescribed with a small amount of water, if necessary. If you take any of the following medications: METFORMIN, GLUCOPHAGE, GLUCOVANCE, AVANDAMET, RIOMET, FORTAMET, ACTOPLUS MET, JANUMET, [place] or METAGLIP, you MAY be asked to HOLD this medication 48 hours AFTER the exam.  ? ?The purpose of you drinking the oral contrast is to aid in the visualization of your intestinal tract. The contrast solution may cause some diarrhea. Depending on your individual set of symptoms, you may also receive an intravenous injection of x-ray contrast/dye. Plan on being at [facility] for 45 minutes or longer, depending on the type of exam you are having performed.  ? ?If you have any questions regarding your exam or if you need to reschedule, you may call [facility] at [phone] between the hours of 8:00 am and 5:00 pm, Monday-Friday.  ? ?Thank you for entrusting me with your care and choosing [facility]. ? ?Dr Ardis Hughs ? ? ?

## 2021-11-29 NOTE — Progress Notes (Signed)
Review of pertinent gastrointestinal problems: ?1.  Increased risk for colon cancer, his father had colon cancer.  Colonoscopy 2014 Dr. Ardis Hughs found 2 subcentimeter adenomas.  Colonoscopy 09/2021 found no polyps, small hemorrhoids were found.  Recommended repeat colonoscopy at 5-year interval given increased risk for colon cancer, father with colon cancer. ? ?HPI: ?This is a very pleasant 71 year old man  ? ?I last saw him about 3 months ago at the time of colonoscopy.  See the results summarized above.  He is here today for a new problem. ? ?Starting about a week after the colonoscopy he has been having intermittent upper and lower abdominal crampy type pains.  The pains occur daily, 2 or 3 times a day.  They last for about 15 minutes.  They go away without him necessarily doing anything.  They are often associate with nausea.  They can happen after he eats.  Moving his bowels does not seem to make a difference.  His weight is overall stable.  He takes an aspirin 81 mg once daily.  He takes no other NSAIDs.  He has rare intermittent GERD.  He has no dysphagia.  His bowels have not been abnormal at all for him, specifically he never has diarrhea and he will once or twice a month have some mild constipation.  He has had no fevers or chills ? ? ?Review of systems: ?Pertinent positive and negative review of systems were noted in the above HPI section. All other review negative. ? ? ?Past Medical History:  ?Diagnosis Date  ? Arthritis   ? CAD (coronary artery disease), native coronary artery   ? mild ASCAD with coronary CTA showing mild CAD in the prox to mid RCA 25-49% and minimal calcified plaque in the ostial and mid LAD and prox LCX.  Calcium score was 23.  FFR showed no flow limiting lesion.  ? Diabetes mellitus without complication ([place])   ? HLD (hyperlipidemia)   ? HTN (hypertension), benign   ? Malignant neoplasm of prostate ([place]) 04/20/2015  ? Osteoarthritis of left hip 09/21/2012  ? Prostate cancer ([place])    ? ? ?Past Surgical History:  ?Procedure Laterality Date  ? JOINT REPLACEMENT    ? PROSTATE BIOPSY    ? TOTAL HIP ARTHROPLASTY Left 09/21/2012  ? Dr French Ana  ? TOTAL HIP ARTHROPLASTY Left 09/21/2012  ? Procedure: TOTAL HIP ARTHROPLASTY;  Surgeon: Yvette Rack., MD;  Location: [place];  Service: Orthopedics;  Laterality: Left;  ? ? ?Current Outpatient Medications  ?Medication Instructions  ? aspirin EC 81 mg, Oral, Daily  ? atorvastatin (LIPITOR) 80 MG tablet Take 1 tablet by mouth once daily  ? diltiazem (CARDIZEM CD) 180 MG 24 hr capsule TAKE 1 CAPSULE(180 MG) BY MOUTH DAILY  ? flecainide (TAMBOCOR) 50 MG tablet Take 1 tablet by mouth twice daily  ? JANUMET 50-500 MG tablet TAKE 1 TABLET BY MOUTH TWICE DAILY WITH A MEAL  ? tadalafil (CIALIS) 20 mg, Oral, Every other day  ? ? ?Allergies as of 11/29/2021  ? (No Known Allergies)  ? ? ?Family History  ?Problem Relation Age of Onset  ? Diabetes Mother   ? Colon cancer Father   ? Pancreatic cancer Neg Hx   ? Rectal cancer Neg Hx   ? Stomach cancer Neg Hx   ? Esophageal cancer Neg Hx   ? Cancer Neg Hx   ? ? ?Social History  ? ?Socioeconomic History  ? Marital status: Married  ?  Spouse name: Not on  file  ? Number of children: Not on file  ? Years of education: Not on file  ? Highest education level: Not on file  ?Occupational History  ? Not on file  ?Tobacco Use  ? Smoking status: Never  ? Smokeless tobacco: Never  ?Vaping Use  ? Vaping Use: Never used  ?Substance and Sexual Activity  ? Alcohol use: No  ?  Comment: occasional  ? Drug use: No  ? Sexual activity: Yes  ?Other Topics Concern  ? Not on file  ?Social History Narrative  ? Not on file  ? ?Social Determinants of Health  ? ?Financial Resource Strain: Not on file  ?Food Insecurity: Not on file  ?Transportation Needs: Not on file  ?Physical Activity: Not on file  ?Stress: Not on file  ?Social Connections: Not on file  ?Intimate Partner Violence: Not on file  ? ? ? ?Physical Exam: ?BP 138/64   Pulse 61   Ht '5\' 5"'$   (1.651 m)   Wt 176 lb (79.8 kg)   SpO2 99%   BMI 29.29 kg/m?  ?Constitutional: generally well-appearing ?Psychiatric: alert and oriented x3 ?Eyes: extraocular movements intact ?Mouth: oral pharynx moist, no lesions ?Neck: supple no lymphadenopathy ?Cardiovascular: heart regular rate and rhythm ?Lungs: clear to auscultation bilaterally ?Abdomen: soft, mildly tender throughout his abdomen, most significant in the upper quadrants, nondistended, no obvious ascites, no peritoneal signs, normal bowel sounds ?Extremities: no lower extremity edema bilaterally ?Skin: no lesions on visible extremities ? ? ?Assessment and plan: ?71 y.o. male with intermittent upper and lower abdominal pains ? ?These occur daily, they last 15 minutes, they are associate with some nausea.  They are often after eating and moving his bowels seem to not related all.  These have been going on for about 2 or 3 months.  This would be quite unusual to be related to the colonoscopy 3 months ago even though the symptoms started 7 or 10 days afterwards.  That would be quite a delayed complication. ? ?He is a bit tender on examination today.  I recommended we start his work-up with blood test as well as a CT scan abdomen pelvis with IV and oral contrast.  He understands that if these tests do not help understand his pains better then he will probably need, likely an upper endoscopy. ? ?Please see the "Patient Instructions" section for addition details about the plan. ? ? ?[name], MD ?[facility] ?11/29/2021, 3:25 PM ? ?Cc: [place], [place] ?Total time on date of encounter was 46  minutes (this included time spent preparing to see the patient reviewing records; obtaining and/or reviewing separately obtained history; performing a medically appropriate exam and/or evaluation; counseling and educating the patient and family if present; ordering medications, tests or procedures if applicable; and documenting clinical information  in the health record). ? ? ? ? ? ?

## 2021-12-07 ENCOUNTER — Ambulatory Visit (HOSPITAL_COMMUNITY)
Admission: RE | Admit: 2021-12-07 | Discharge: 2021-12-07 | Disposition: A | Discharge status: Home / Self Care | Payer: Medicare Other | Source: Ambulatory Visit | Attending: Gastroenterology | Admitting: Gastroenterology

## 2021-12-07 ENCOUNTER — Encounter (HOSPITAL_COMMUNITY): Payer: Self-pay

## 2021-12-07 DIAGNOSIS — R1030 Lower abdominal pain, unspecified: Secondary | ICD-10-CM | POA: Diagnosis not present

## 2021-12-07 DIAGNOSIS — I700 Atherosclerosis of aorta: Secondary | ICD-10-CM | POA: Diagnosis not present

## 2021-12-07 DIAGNOSIS — R1013 Epigastric pain: Secondary | ICD-10-CM | POA: Diagnosis not present

## 2021-12-07 MED ORDER — SODIUM CHLORIDE (PF) 0.9 % IJ SOLN
INTRAMUSCULAR | Status: AC
  Filled 2021-12-07: qty 50

## 2021-12-07 MED ORDER — IOHEXOL 300 MG/ML  SOLN
100.0000 mL | Freq: Once | INTRAMUSCULAR | Status: AC | PRN
  Administered 2021-12-07: 100 mL via INTRAVENOUS

## 2021-12-10 ENCOUNTER — Telehealth: Payer: Self-pay | Admitting: Gastroenterology

## 2021-12-10 NOTE — Telephone Encounter (Signed)
Please call the patient.  CT scan shows perhaps a bit more stool than his usual but no clear etiology of his abdominal pains.  As we discussed in the office I think the next step is upper endoscopy, LAC.  Thanks ? ? ?The pt has been advised and states he wants to think about the EGD and call back if he decides to proceed.  ?

## 2021-12-10 NOTE — Telephone Encounter (Signed)
Patient calling to obtain CT results. ?

## 2021-12-17 ENCOUNTER — Other Ambulatory Visit: Payer: Self-pay | Admitting: Internal Medicine

## 2021-12-21 ENCOUNTER — Other Ambulatory Visit: Payer: Self-pay | Admitting: Internal Medicine

## 2022-01-12 ENCOUNTER — Other Ambulatory Visit: Payer: Self-pay | Admitting: Emergency Medicine

## 2022-01-12 DIAGNOSIS — E1165 Type 2 diabetes mellitus with hyperglycemia: Secondary | ICD-10-CM

## 2022-04-13 ENCOUNTER — Other Ambulatory Visit: Payer: Self-pay | Admitting: Cardiology

## 2022-04-29 ENCOUNTER — Other Ambulatory Visit: Payer: Self-pay | Admitting: Cardiology

## 2022-04-29 ENCOUNTER — Other Ambulatory Visit: Payer: Self-pay | Admitting: Emergency Medicine

## 2022-04-29 DIAGNOSIS — E1165 Type 2 diabetes mellitus with hyperglycemia: Secondary | ICD-10-CM

## 2022-05-02 ENCOUNTER — Other Ambulatory Visit: Payer: Self-pay | Admitting: Cardiology

## 2022-06-08 ENCOUNTER — Other Ambulatory Visit: Payer: Self-pay | Admitting: Cardiology

## 2022-06-13 ENCOUNTER — Telehealth: Payer: Self-pay | Admitting: Cardiology

## 2022-06-13 MED ORDER — ATORVASTATIN CALCIUM 80 MG PO TABS: 80.0000 mg | ORAL_TABLET | Freq: Every day | ORAL | 0 refills | Status: DC

## 2022-06-13 MED ORDER — DILTIAZEM HCL ER COATED BEADS 180 MG PO CP24: ORAL_CAPSULE | ORAL | 0 refills | Status: DC

## 2022-06-13 NOTE — Telephone Encounter (Signed)
Pt's medications were sent to pt's pharmacy as requested. Confirmation received.  

## 2022-06-13 NOTE — Telephone Encounter (Signed)
*  STAT* If patient is at the pharmacy, call can be transferred to refill team.   1. Which medications need to be refilled? (please list name of each medication and dose if known)  atorvastatin (LIPITOR) 80 MG tablet diltiazem (CARDIZEM CD) 180 MG 24 hr   2. Which pharmacy/location (including street and city if local pharmacy) is medication to be sent to? [place], [place]   3. Do they need a 30 day or 90 day supply?  90 day supply

## 2022-06-27 ENCOUNTER — Other Ambulatory Visit: Payer: Self-pay | Admitting: Cardiology

## 2022-06-28 ENCOUNTER — Other Ambulatory Visit: Payer: Self-pay | Admitting: Cardiology

## 2022-07-07 ENCOUNTER — Ambulatory Visit (INDEPENDENT_AMBULATORY_CARE_PROVIDER_SITE_OTHER): Payer: Medicare Other | Admitting: Emergency Medicine

## 2022-07-07 ENCOUNTER — Encounter: Payer: Self-pay | Admitting: Emergency Medicine

## 2022-07-07 VITALS — BP 130/68 | HR 57 | Temp 97.8°F | Ht 65.0 in | Wt 176.2 lb

## 2022-07-07 DIAGNOSIS — E1169 Type 2 diabetes mellitus with other specified complication: Secondary | ICD-10-CM | POA: Diagnosis not present

## 2022-07-07 DIAGNOSIS — E1165 Type 2 diabetes mellitus with hyperglycemia: Secondary | ICD-10-CM | POA: Diagnosis not present

## 2022-07-07 DIAGNOSIS — Z23 Encounter for immunization: Secondary | ICD-10-CM | POA: Diagnosis not present

## 2022-07-07 DIAGNOSIS — E785 Hyperlipidemia, unspecified: Secondary | ICD-10-CM

## 2022-07-07 DIAGNOSIS — I495 Sick sinus syndrome: Secondary | ICD-10-CM | POA: Diagnosis not present

## 2022-07-07 DIAGNOSIS — I152 Hypertension secondary to endocrine disorders: Secondary | ICD-10-CM

## 2022-07-07 DIAGNOSIS — E1159 Type 2 diabetes mellitus with other circulatory complications: Secondary | ICD-10-CM

## 2022-07-07 LAB — CBC WITH DIFFERENTIAL/PLATELET
Basophils Absolute: 0 10*3/uL (ref 0.0–0.1)
Basophils Relative: 0.4 % (ref 0.0–3.0)
Eosinophils Absolute: 0.1 10*3/uL (ref 0.0–0.7)
Eosinophils Relative: 1.3 % (ref 0.0–5.0)
HCT: 41.1 % (ref 39.0–52.0)
Hemoglobin: 13.9 g/dL (ref 13.0–17.0)
Lymphocytes Relative: 21.7 % (ref 12.0–46.0)
Lymphs Abs: 1.7 10*3/uL (ref 0.7–4.0)
MCHC: 33.9 g/dL (ref 30.0–36.0)
MCV: 89 fl (ref 78.0–100.0)
Monocytes Absolute: 0.6 10*3/uL (ref 0.1–1.0)
Monocytes Relative: 7.4 % (ref 3.0–12.0)
Neutro Abs: 5.5 10*3/uL (ref 1.4–7.7)
Neutrophils Relative %: 69.2 % (ref 43.0–77.0)
Platelets: 290 10*3/uL (ref 150.0–400.0)
RBC: 4.62 Mil/uL (ref 4.22–5.81)
RDW: 14.2 % (ref 11.5–15.5)
WBC: 8 10*3/uL (ref 4.0–10.5)

## 2022-07-07 LAB — POCT GLYCOSYLATED HEMOGLOBIN (HGB A1C): Hemoglobin A1C: 6.4 % — AB (ref 4.0–5.6)

## 2022-07-07 LAB — COMPREHENSIVE METABOLIC PANEL
ALT: 21 U/L (ref 0–53)
AST: 18 U/L (ref 0–37)
Albumin: 4.5 g/dL (ref 3.5–5.2)
Alkaline Phosphatase: 52 U/L (ref 39–117)
BUN: 15 mg/dL (ref 6–23)
CO2: 31 mEq/L (ref 19–32)
Calcium: 9.3 mg/dL (ref 8.4–10.5)
Chloride: 102 mEq/L (ref 96–112)
Creatinine, Ser: 0.91 mg/dL (ref 0.40–1.50)
GFR: 85.19 mL/min (ref >60.00)
Glucose, Bld: 127 mg/dL — ABNORMAL HIGH (ref 70–99)
Potassium: 4.2 mEq/L (ref 3.5–5.1)
Sodium: 138 mEq/L (ref 135–145)
Total Bilirubin: 0.4 mg/dL (ref 0.2–1.2)
Total Protein: 7.4 g/dL (ref 6.0–8.3)

## 2022-07-07 LAB — LIPID PANEL
Cholesterol: 120 mg/dL (ref 0–200)
HDL: 45.3 mg/dL (ref >39.00)
LDL Cholesterol: 59 mg/dL (ref 0–99)
NonHDL: 74.94
Total CHOL/HDL Ratio: 3
Triglycerides: 79 mg/dL (ref 0.0–149.0)
VLDL: 15.8 mg/dL (ref 0.0–40.0)

## 2022-07-07 LAB — MICROALBUMIN / CREATININE URINE RATIO
Creatinine,U: 207.3 mg/dL
Microalb Creat Ratio: 0.5 mg/g (ref 0.0–30.0)
Microalb, Ur: 1.1 mg/dL (ref 0.0–1.9)

## 2022-07-07 NOTE — Assessment & Plan Note (Signed)
Stable.  Continue Tambocor 50 mg twice a day

## 2022-07-07 NOTE — Progress Notes (Signed)
Kenneth Velez 71 y.o.   Chief Complaint  Patient presents with   Annual Exam    Physical, wants to talk about his heart has some concerns.    HISTORY OF PRESENT ILLNESS: This is a 71 y.o. male here for follow-up of chronic medical conditions. History of diabetes, hypertension, and dyslipidemia, Most recent cardiology office visit assessment and plan as follows:  ASSESSMENT:     1. Coronary artery disease of native artery of native heart with stable angina pectoris (Kenneth Velez)   2. Essential hypertension   3. Hyperlipidemia LDL goal <70   4. SVT (supraventricular tachycardia) (Normal)   5. Sinus node dysfunction (HCC)     PLAN:     In order of problems listed above:   1.  ASCAD -coronary CTA showed mild CAD in the prox to mid RCA 25-49% and minimal calcified plaque in the ostial and mid LAD and prox LCX.  Calcium score was 23.  FFR showed no flow limiting lesion -he has chronic atypical CP that is sharp in his back and chest and not related to exertion with constant chest heaviness -  likely MSK with a high anxiety component was well.   -ETT in Feb 2021 normal -Cardiopulmonary stress test showed normal functional capacity with no cardiopulmonary limitation but did have a significant chronotropic incompetence and hypertensive response to exercise -Referred to pulmonary with negative workup -BB changed to CCB and started on  Flecainide for PVCs and PACS with resolution of his SOB -he has not had any further sharp CP but has pressure when he exerts himself in colder weathert -I will get a stress myoview to assess for ischemia -Shared Decision Making/Informed Consent The risks [chest pain, shortness of breath, cardiac arrhythmias, dizziness, blood pressure fluctuations, myocardial infarction, stroke/transient ischemic attack, nausea, vomiting, allergic reaction, radiation exposure, metallic taste sensation and life-threatening complications (estimated to be 1 in 10,000)], benefits (risk  stratification, diagnosing coronary artery disease, treatment guidance) and alternatives of a nuclear stress test were discussed in detail with Kenneth Velez and he agrees to proceed. -continue ASA, statin and CCB   2.  HTN -BP controlled on exam today -Continue prescription drug management with Cardizem CD '180mg'$  daily>refilled    3.  HLD -LDL goal < 70 -I have personally reviewed and interpreted outside labs performed by patient's PCP which showed  LDL 69, HDL 39, TAG 70 and ALT 29 in Aug 2022 -Continue prescription drug management with Atorvastatin '80mg'$  daily>refilled    4.  SVT -fairly asymptomatic with minimal palpitations -Continue prescription drug management with Cardizem CD '180mg'$  daily and Flecainide '50mg'$  BID>refilled  -ETT in 09/2019 with no exercise induced arrhythmias   5.  Sinus node dysfunction -chronotropic incompetence on CPTX -no indication per EP for PPM at this time     Medication Adjustments/Labs and Tests Ordered: Current medicines are reviewed at length with the patient today.  Concerns regarding medicines are outlined above.     Orders Placed This Encounter  Procedures   EKG 12-Lead      No orders of the defined types were placed in this encounter.     Signed, Kenneth Him, MD  04/19/2021 10:53 AM    [place]     HPI   Prior to Admission medications   Medication Sig Start Date End Date Taking? Authorizing Provider  aspirin EC 81 MG tablet Take 1 tablet (81 mg total) by mouth daily. 02/25/19  Yes Kenneth Margarita, MD  atorvastatin (LIPITOR) 80  MG tablet Take 1 tablet (80 mg total) by mouth daily. 06/13/22  Yes Kenneth Velez, Kenneth Hong, MD  diltiazem (CARDIZEM CD) 180 MG 24 hr capsule TAKE 1 CAPSULE BY MOUTH ONCE DAILY . 06/13/22  Yes Kenneth Margarita, MD  flecainide (TAMBOCOR) 50 MG tablet Take 1 tablet by mouth twice daily 12/21/21  Yes Kenneth Velez, Kenneth Hong, MD  JANUMET 50-500 MG tablet TAKE 1 TABLET BY MOUTH TWICE DAILY WITH A MEAL 04/30/22   Yes Kenneth Velez, Kenneth Bloomer, MD  tadalafil (CIALIS) 20 MG tablet Take 20 mg by mouth every other day. 07/30/19  Yes [provider]    No Known Allergies  Patient Active Problem List   Diagnosis Date Noted   CAD (coronary artery disease), native coronary artery 04/19/2021   Atherosclerosis of aorta ([place]) 03/29/2021   Dyslipidemia associated with type 2 diabetes mellitus ([place]) 06/23/2020   Interstitial pulmonary disease ([place]) 06/23/2020   Hypertension associated with diabetes ([place]) 10/08/2019   Sinus node dysfunction ([place]) 10/08/2019   SVT (supraventricular tachycardia) 07/18/2018   Wide-complex tachycardia 07/18/2018   Exertional angina 06/04/2018   Malignant neoplasm of prostate ([place]) 04/20/2015   Osteoarthritis of left hip 09/21/2012    Past Medical History:  Diagnosis Date   Arthritis    CAD (coronary artery disease), native coronary artery    mild ASCAD with coronary CTA showing mild CAD in the prox to mid RCA 25-49% and minimal calcified plaque in the ostial and mid LAD and prox LCX.  Calcium score was 23.  FFR showed no flow limiting lesion.   Diabetes mellitus without complication ([place])    HLD (hyperlipidemia)    HTN (hypertension), benign    Malignant neoplasm of prostate ([place]) 04/20/2015   Osteoarthritis of left hip 09/21/2012   Prostate cancer [facility])     Past Surgical History:  Procedure Laterality Date   JOINT REPLACEMENT     PROSTATE BIOPSY     TOTAL HIP ARTHROPLASTY Left 09/21/2012   Dr Kenneth Velez   TOTAL HIP ARTHROPLASTY Left 09/21/2012   Procedure: TOTAL HIP ARTHROPLASTY;  Surgeon: Kenneth Velez., MD;  Location: [place];  Service: Orthopedics;  Laterality: Left;    Social History   Socioeconomic History   Marital status: Married    Spouse name: Not on file   Number of children: Not on file   Years of education: Not on file   Highest education level: Not on file  Occupational History   Not on file  Tobacco Use   Smoking status: Never   Smokeless  tobacco: Never  Vaping Use   Vaping Use: Never used  Substance and Sexual Activity   Alcohol use: No    Comment: occasional   Drug use: No   Sexual activity: Yes  Other Topics Concern   Not on file  Social History Narrative   Not on file   Social Determinants of Health   Financial Resource Strain: Not on file  Food Insecurity: Not on file  Transportation Needs: Not on file  Physical Activity: Not on file  Stress: Not on file  Social Connections: Not on file  Intimate Partner Violence: Not on file    Family History  Problem Relation Age of Onset   Diabetes Mother    Colon cancer Father    Pancreatic cancer Neg Hx    Rectal cancer Neg Hx    Stomach cancer Neg Hx    Esophageal cancer Neg Hx    Cancer Neg Hx  Review of Systems  Constitutional: Negative.  Negative for chills and fever.  HENT: Negative.  Negative for congestion and sore throat.   Respiratory: Negative.  Negative for cough and shortness of breath.   Cardiovascular: Negative.  Negative for chest pain and palpitations.  Gastrointestinal: Negative.  Negative for abdominal pain, diarrhea, nausea and vomiting.  Genitourinary: Negative.  Negative for dysuria and hematuria.  Skin: Negative.  Negative for rash.  Neurological: Negative.  Negative for dizziness and headaches.  All other systems reviewed and are negative.   Today's Vitals   07/07/22 0937  BP: 130/68  Pulse: (!) 57  Temp: 97.8 F (36.6 C)  TempSrc: Oral  SpO2: 96%  Weight: 176 lb 3.2 oz (79.9 kg)  Height: '5\' 5"'$  (1.651 m)   Body mass index is 29.32 kg/m.  Physical Exam Vitals reviewed.  Constitutional:      Appearance: Normal appearance.  HENT:     Head: Normocephalic.     Right Ear: Tympanic membrane, ear canal and external ear normal.     Left Ear: Tympanic membrane, ear canal and external ear normal.     Mouth/Throat:     Mouth: Mucous membranes are moist.     Pharynx: Oropharynx is clear.  Eyes:     Extraocular Movements:  Extraocular movements intact.     Conjunctiva/sclera: Conjunctivae normal.     Pupils: Pupils are equal, round, and reactive to light.  Cardiovascular:     Rate and Rhythm: Normal rate and regular rhythm.     Pulses: Normal pulses.     Heart sounds: Normal heart sounds.  Pulmonary:     Effort: Pulmonary effort is normal.     Breath sounds: Normal breath sounds.  Abdominal:     Palpations: Abdomen is soft.     Tenderness: There is no abdominal tenderness.  Musculoskeletal:     Cervical back: No tenderness.  Lymphadenopathy:     Cervical: No cervical adenopathy.  Skin:    General: Skin is warm and dry.  Neurological:     General: No focal deficit present.     Mental Status: He is alert and oriented to person, place, and time.  Psychiatric:        Mood and Affect: Mood normal.        Behavior: Behavior normal.    Results for orders placed or performed in visit on 07/07/22 (from the past 24 hour(s))  POCT HgB A1C     Status: Abnormal   Collection Time: 07/07/22 10:06 AM  Result Value Ref Range   Hemoglobin A1C 6.4 (A) 4.0 - 5.6 %   HbA1c POC (<> result, manual entry)     HbA1c, POC (prediabetic range)     HbA1c, POC (controlled diabetic range)       ASSESSMENT & PLAN: A total of 47 minutes was spent with the patient and counseling/coordination of care regarding preparing for this visit, review of most recent office visit notes, review of most recent blood work results including interpretation of today's hemoglobin A1c, review of multiple chronic medical conditions and their management, review of all medications, education on nutrition, cardiovascular risks associated with hypertension and diabetes, prognosis, documentation, need for follow-up.  Problem List Items Addressed This Visit       Cardiovascular and Mediastinum   Hypertension associated with diabetes ([place]) - Primary    Well-controlled hypertension. Continue Cardizem CD 180 mg daily Well-controlled diabetes with  hemoglobin A1c of 6.4. Continue Janumet 50-500 mg twice a day Diet and  nutrition discussed      Relevant Orders   CBC with Differential/Platelet   Comprehensive metabolic panel   Urine Microalbumin w/creat. ratio   Sinus node dysfunction (HCC)    Stable.  Continue Tambocor 50 mg twice a day        Endocrine   Dyslipidemia associated with type 2 diabetes mellitus ([place])    Stable.  Diet and nutrition discussed. Continue atorvastatin 80 mg daily.      Relevant Orders   Lipid panel   Other Visit Diagnoses     Type 2 diabetes mellitus with hyperglycemia, without long-term current use of insulin (HCC)       Relevant Orders   POCT HgB A1C (Completed)   Pneumococcal conjugate vaccine 20-valent (Prevnar 20) (Completed)      Patient Instructions  Health Maintenance After Age 34 After age 54, you are at a higher risk for certain long-term diseases and infections as well as injuries from falls. Falls are a major cause of broken bones and head injuries in people who are older than age 28. Getting regular preventive care can help to keep you healthy and well. Preventive care includes getting regular testing and making lifestyle changes as recommended by your health care provider. Talk with your health care provider about: Which screenings and tests you should have. A screening is a test that checks for a disease when you have no symptoms. A diet and exercise plan that is right for you. What should I know about screenings and tests to prevent falls? Screening and testing are the best ways to find a health problem early. Early diagnosis and treatment give you the best chance of managing medical conditions that are common after age 1. Certain conditions and lifestyle choices may make you more likely to have a fall. Your health care provider may recommend: Regular vision checks. Poor vision and conditions such as cataracts can make you more likely to have a fall. If you wear glasses, make sure  to get your prescription updated if your vision changes. Medicine review. Work with your health care provider to regularly review all of the medicines you are taking, including over-the-counter medicines. Ask your health care provider about any side effects that may make you more likely to have a fall. Tell your health care provider if any medicines that you take make you feel dizzy or sleepy. Strength and balance checks. Your health care provider may recommend certain tests to check your strength and balance while standing, walking, or changing positions. Foot health exam. Foot pain and numbness, as well as not wearing proper footwear, can make you more likely to have a fall. Screenings, including: Osteoporosis screening. Osteoporosis is a condition that causes the bones to get weaker and break more easily. Blood pressure screening. Blood pressure changes and medicines to control blood pressure can make you feel dizzy. Depression screening. You may be more likely to have a fall if you have a fear of falling, feel depressed, or feel unable to do activities that you used to do. Alcohol use screening. Using too much alcohol can affect your balance and may make you more likely to have a fall. Follow these instructions at home: Lifestyle Do not drink alcohol if: Your health care provider tells you not to drink. If you drink alcohol: Limit how much you have to: 0-1 drink a day for women. 0-2 drinks a day for men. Know how much alcohol is in your drink. In the [place] one drink equals one 12  oz bottle of beer (355 mL), one 5 oz glass of wine (148 mL), or one 1 oz glass of hard liquor (44 mL). Do not use any products that contain nicotine or tobacco. These products include cigarettes, chewing tobacco, and vaping devices, such as e-cigarettes. If you need help quitting, ask your health care provider. Activity  Follow a regular exercise program to stay fit. This will help you maintain your balance. Ask  your health care provider what types of exercise are appropriate for you. If you need a cane or walker, use it as recommended by your health care provider. Wear supportive shoes that have nonskid soles. Safety  Remove any tripping hazards, such as rugs, cords, and clutter. Install safety equipment such as grab bars in bathrooms and safety rails on stairs. Keep rooms and walkways well-lit. General instructions Talk with your health care provider about your risks for falling. Tell your health care provider if: You fall. Be sure to tell your health care provider about all falls, even ones that seem minor. You feel dizzy, tiredness (fatigue), or off-balance. Take over-the-counter and prescription medicines only as told by your health care provider. These include supplements. Eat a healthy diet and maintain a healthy weight. A healthy diet includes low-fat dairy products, low-fat (lean) meats, and fiber from whole grains, beans, and lots of fruits and vegetables. Stay current with your vaccines. Schedule regular health, dental, and eye exams. Summary Having a healthy lifestyle and getting preventive care can help to protect your health and wellness after age 4. Screening and testing are the best way to find a health problem early and help you avoid having a fall. Early diagnosis and treatment give you the best chance for managing medical conditions that are more common for people who are older than age 29. Falls are a major cause of broken bones and head injuries in people who are older than age 25. Take precautions to prevent a fall at home. Work with your health care provider to learn what changes you can make to improve your health and wellness and to prevent falls. This information is not intended to replace advice given to you by your health care provider. Make sure you discuss any questions you have with your health care provider. Document Revised: 12/07/2020 Document Reviewed:  12/07/2020 Elsevier Patient Education  [place], MD [facility]

## 2022-07-07 NOTE — Assessment & Plan Note (Signed)
Stable.  Diet and nutrition discussed. Continue atorvastatin 80 mg daily.

## 2022-07-07 NOTE — Assessment & Plan Note (Signed)
Well-controlled hypertension. Continue Cardizem CD 180 mg daily Well-controlled diabetes with hemoglobin A1c of 6.4. Continue Janumet 50-500 mg twice a day Diet and nutrition discussed

## 2022-07-07 NOTE — Patient Instructions (Signed)
Health Maintenance After Age 70 After age 70, you are at a higher risk for certain long-term diseases and infections as well as injuries from falls. Falls are a major cause of broken bones and head injuries in people who are older than age 70. Getting regular preventive care can help to keep you healthy and well. Preventive care includes getting regular testing and making lifestyle changes as recommended by your health care provider. Talk with your health care provider about: Which screenings and tests you should have. A screening is a test that checks for a disease when you have no symptoms. A diet and exercise plan that is right for you. What should I know about screenings and tests to prevent falls? Screening and testing are the best ways to find a health problem early. Early diagnosis and treatment give you the best chance of managing medical conditions that are common after age 70. Certain conditions and lifestyle choices may make you more likely to have a fall. Your health care provider may recommend: Regular vision checks. Poor vision and conditions such as cataracts can make you more likely to have a fall. If you wear glasses, make sure to get your prescription updated if your vision changes. Medicine review. Work with your health care provider to regularly review all of the medicines you are taking, including over-the-counter medicines. Ask your health care provider about any side effects that may make you more likely to have a fall. Tell your health care provider if any medicines that you take make you feel dizzy or sleepy. Strength and balance checks. Your health care provider may recommend certain tests to check your strength and balance while standing, walking, or changing positions. Foot health exam. Foot pain and numbness, as well as not wearing proper footwear, can make you more likely to have a fall. Screenings, including: Osteoporosis screening. Osteoporosis is a condition that causes  the bones to get weaker and break more easily. Blood pressure screening. Blood pressure changes and medicines to control blood pressure can make you feel dizzy. Depression screening. You may be more likely to have a fall if you have a fear of falling, feel depressed, or feel unable to do activities that you used to do. Alcohol use screening. Using too much alcohol can affect your balance and may make you more likely to have a fall. Follow these instructions at home: Lifestyle Do not drink alcohol if: Your health care provider tells you not to drink. If you drink alcohol: Limit how much you have to: 0-1 drink a day for women. 0-2 drinks a day for men. Know how much alcohol is in your drink. In the [place] one drink equals one 12 oz bottle of beer (355 mL), one 5 oz glass of wine (148 mL), or one 1 oz glass of hard liquor (44 mL). Do not use any products that contain nicotine or tobacco. These products include cigarettes, chewing tobacco, and vaping devices, such as e-cigarettes. If you need help quitting, ask your health care provider. Activity  Follow a regular exercise program to stay fit. This will help you maintain your balance. Ask your health care provider what types of exercise are appropriate for you. If you need a cane or walker, use it as recommended by your health care provider. Wear supportive shoes that have nonskid soles. Safety  Remove any tripping hazards, such as rugs, cords, and clutter. Install safety equipment such as grab bars in bathrooms and safety rails on stairs. Keep rooms and walkways   well-lit. General instructions Talk with your health care provider about your risks for falling. Tell your health care provider if: You fall. Be sure to tell your health care provider about all falls, even ones that seem minor. You feel dizzy, tiredness (fatigue), or off-balance. Take over-the-counter and prescription medicines only as told by your health care provider. These include  supplements. Eat a healthy diet and maintain a healthy weight. A healthy diet includes low-fat dairy products, low-fat (lean) meats, and fiber from whole grains, beans, and lots of fruits and vegetables. Stay current with your vaccines. Schedule regular health, dental, and eye exams. Summary Having a healthy lifestyle and getting preventive care can help to protect your health and wellness after age 70. Screening and testing are the best way to find a health problem early and help you avoid having a fall. Early diagnosis and treatment give you the best chance for managing medical conditions that are more common for people who are older than age 70. Falls are a major cause of broken bones and head injuries in people who are older than age 70. Take precautions to prevent a fall at home. Work with your health care provider to learn what changes you can make to improve your health and wellness and to prevent falls. This information is not intended to replace advice given to you by your health care provider. Make sure you discuss any questions you have with your health care provider. Document Revised: 12/07/2020 Document Reviewed: 12/07/2020 Elsevier Patient Education  2023 [employer]  

## 2022-07-21 ENCOUNTER — Encounter: Payer: Self-pay | Admitting: Cardiology

## 2022-07-21 ENCOUNTER — Ambulatory Visit: Payer: Medicare Other | Attending: Cardiology | Admitting: Cardiology

## 2022-07-21 VITALS — BP 124/60 | HR 62 | Ht 65.0 in | Wt 177.0 lb

## 2022-07-21 DIAGNOSIS — I495 Sick sinus syndrome: Secondary | ICD-10-CM

## 2022-07-21 DIAGNOSIS — I25118 Atherosclerotic heart disease of native coronary artery with other forms of angina pectoris: Secondary | ICD-10-CM

## 2022-07-21 DIAGNOSIS — I471 Supraventricular tachycardia, unspecified: Secondary | ICD-10-CM | POA: Diagnosis not present

## 2022-07-21 DIAGNOSIS — E785 Hyperlipidemia, unspecified: Secondary | ICD-10-CM | POA: Diagnosis not present

## 2022-07-21 DIAGNOSIS — I10 Essential (primary) hypertension: Secondary | ICD-10-CM

## 2022-07-21 NOTE — Addendum Note (Signed)
Addended by: Daiva Huge on: 07/21/2022 12:57 PM   Modules accepted: Orders

## 2022-07-21 NOTE — Progress Notes (Signed)
Cardiology Office Note:    Date:  07/21/2022   ID:  Kenneth Velez, Kenneth Velez [birth year], MRN [id number]  PCP:  Horald Pollen, MD  Cardiologist:  Fransico Him, MD    Referring MD: Horald Pollen, *   Chief Complaint  Patient presents with   Coronary Artery Disease   Hypertension   Hyperlipidemia     History of Present Illness:    Kenneth Velez is a 71 y.o. male with a hx of mild ASCAD with coronary CTA showing mild CAD in the prox to mid RCA 25-49% and minimal calcified plaque in the ostial and mid LAD and prox LCX.  Calcium score was 23.  FFR showed no flow limiting lesion.  2D echo for CP and SOB showed normal LVEF 60 to 65% with mild LVH and grade 1 DD, nuclear stress test normal, 24-hour monitor showed sinus bradycardia, normal sinus rhythm and sinus tachycardia average heart rate 61 bpm range 41 to 231 bpm and SVT up to 7 beats at a time, frequent PACs and wide-complex tachycardia up to 11 beats with bigeminal PVCs with  PVC load less than 1%. He was placed on Toprol XL 50 mg in the morning '25mg'$  in the afternoon.   He continued to have atypical sharp stabbing CP in the back despite coronary CTA showing  mild CAD in the proximal to mid RCA 25 to 49% and minimal calcified plaque in the ostial and mid LAD and proximal circumflex.  Calcium score 23.  FFR no flow-limiting lesions.  CP was very atypical and felt not cardiac in origin.    He was referred to Pulmonary for evaluation which was unremarkable. His cardiopulmonary stress test showed that he may not be able to increase HR with exercise which may be attributing to his SOB. He was seen back by Estella Husk, PA in 07/2019 and he was complaining of continued SOB and CP.  His sx were felt to possibly be due to weight and anxiety.  He was also complaining severe fatigue and his Bystolic was stopped and started on Diltiazem for palpitations.  He was seen by Cristopher Peru, MD 08/22/2019 for ongoing palpitations.  His  heart monitor showed PVCs, NSVT and multiple atrial tachycardias.  He was started on Flecainide.  Nuclear stress test 2022 showed no ischemia.   He is here today for followup and is doing well.  He denies any chest pain or pressure, SOB, DOE, PND, orthopnea, LE edema, dizziness, palpitations or syncope. He is compliant with his meds and is tolerating meds with no SE.    Past Medical History:  Diagnosis Date   Arthritis    CAD (coronary artery disease), native coronary artery    mild ASCAD with coronary CTA showing mild CAD in the prox to mid RCA 25-49% and minimal calcified plaque in the ostial and mid LAD and prox LCX.  Calcium score was 23.  FFR showed no flow limiting lesion.   Diabetes mellitus without complication ([place])    HLD (hyperlipidemia)    HTN (hypertension), benign    Malignant neoplasm of prostate ([place]) 04/20/2015   Osteoarthritis of left hip 09/21/2012   Prostate cancer [facility])     Past Surgical History:  Procedure Laterality Date   JOINT REPLACEMENT     PROSTATE BIOPSY     TOTAL HIP ARTHROPLASTY Left 09/21/2012   Dr French Ana   TOTAL HIP ARTHROPLASTY Left 09/21/2012   Procedure: TOTAL HIP ARTHROPLASTY;  Surgeon: Yvette Rack., MD;  Location: [place];  Service: Orthopedics;  Laterality: Left;    Current Medications: Current Meds  Medication Sig   aspirin EC 81 MG tablet Take 1 tablet (81 mg total) by mouth daily.   atorvastatin (LIPITOR) 80 MG tablet Take 1 tablet (80 mg total) by mouth daily.   diltiazem (CARDIZEM CD) 180 MG 24 hr capsule TAKE 1 CAPSULE BY MOUTH ONCE DAILY .   flecainide (TAMBOCOR) 50 MG tablet Take 1 tablet by mouth twice daily   JANUMET 50-500 MG tablet TAKE 1 TABLET BY MOUTH TWICE DAILY WITH A MEAL   tadalafil (CIALIS) 20 MG tablet Take 20 mg by mouth every other day.     Allergies:   Patient has no known allergies.   Social History   Socioeconomic History   Marital status: Married    Spouse name: Not on file   Number of children: Not on file    Years of education: Not on file   Highest education level: Not on file  Occupational History   Not on file  Tobacco Use   Smoking status: Never   Smokeless tobacco: Never  Vaping Use   Vaping Use: Never used  Substance and Sexual Activity   Alcohol use: No    Comment: occasional   Drug use: No   Sexual activity: Yes  Other Topics Concern   Not on file  Social History Narrative   Not on file   Social Determinants of Health   Financial Resource Strain: Not on file  Food Insecurity: Not on file  Transportation Needs: Not on file  Physical Activity: Not on file  Stress: Not on file  Social Connections: Not on file     Family History: The patient's family history includes Colon cancer in his father; Diabetes in his mother. There is no history of Pancreatic cancer, Rectal cancer, Stomach cancer, Esophageal cancer, or Cancer.  ROS:   Please see the history of present illness.    ROS  All other systems reviewed and negative.   EKGs/Labs/Other Studies Reviewed:    The following studies were reviewed today: EKG. Nuclear stress test, coronary CTA< 2D echo  EKG:  EKG is  ordered today and showed NSR with no ST changes  Recent Labs: 07/07/2022: ALT 21; BUN 15; Creatinine, Ser 0.91; Hemoglobin 13.9; Platelets 290.0; Potassium 4.2; Sodium 138   Recent Lipid Panel    Component Value Date/Time   CHOL 120 07/07/2022 1029   CHOL 131 03/23/2020 1245   TRIG 79.0 07/07/2022 1029   HDL 45.30 07/07/2022 1029   HDL 47 03/23/2020 1245   CHOLHDL 3 07/07/2022 1029   VLDL 15.8 07/07/2022 1029   LDLCALC 59 07/07/2022 1029   LDLCALC 71 03/23/2020 1245    Physical Exam:    VS:  BP 124/60   Pulse 62   Ht '5\' 5"'$  (1.651 m)   Wt 177 lb (80.3 kg)   SpO2 99%   BMI 29.45 kg/m     Wt Readings from Last 3 Encounters:  07/21/22 177 lb (80.3 kg)  07/07/22 176 lb 3.2 oz (79.9 kg)  11/29/21 176 lb (79.8 kg)    GEN: Well nourished, well developed in no acute distress HEENT:  Normal NECK: No JVD; No carotid bruits LYMPHATICS: No lymphadenopathy CARDIAC:RRR, no murmurs, rubs, gallops RESPIRATORY:  Clear to auscultation without rales, wheezing or rhonchi  ABDOMEN: Soft, non-tender, non-distended MUSCULOSKELETAL:  No edema; No deformity  SKIN: Warm and dry NEUROLOGIC:  Alert and oriented x 3 PSYCHIATRIC:  Normal  affect  ASSESSMENT:    1. Coronary artery disease of native artery of native heart with stable angina pectoris ([place])   2. Essential hypertension   3. Hyperlipidemia LDL goal <70   4. SVT (supraventricular tachycardia)   5. Sinus node dysfunction ([facility])    PLAN:    In order of problems listed above:  1.  ASCAD -coronary CTA showed mild CAD in the prox to mid RCA 25-49% and minimal calcified plaque in the ostial and mid LAD and prox LCX.  Calcium score was 23.  FFR showed no flow limiting lesion -he has chronic atypical CP that is sharp in his back and chest and not related to exertion with constant chest heaviness -  likely MSK with a high anxiety component was well.   -ETT in Feb 2021 normal and nuclear stress test 04/2021 showed no ischemia -Cardiopulmonary stress test showed normal functional capacity with no cardiopulmonary limitation but did have a significant chronotropic incompetence and hypertensive response to exercise -Referred to pulmonary with negative workup -BB changed to CCB and started on  Flecainide for PVCs and PACS with resolution of his SOB -He denies any anginal symptoms  -Continue prescription drug management aspirin 81 mg daily, Cardizem CD 180 mg daily and atorvastatin 80 mg daily with as needed refills continue ASA, statin and CCB  2.  HTN -Blood pressure is controlled on exam today -Continue drug management with Cardizem CD 180 mg daily with.  Refills   3.  HLD -LDL goal < 70 -I have personally reviewed and interpreted outside labs performed by patient's PCP which showed LDL 59 and HDL 45 on 07/07/2022 and ALT normal at  21 -Continue prescription drug management with atorvastatin 80 mg daily with.  Refills  4.  SVT -He denies any significant palpitations -Continue prescription drug management with Cardizem CD 180 mg daily and flecainide 50 mg twice daily with as needed refills -ETT in 09/2019 with no exercise induced arrhythmias and new stress test 04/2021 showed no ischemia  5.  Sinus node dysfunction -chronotropic incompetence on CPTX -no indication per EP for PPM at this time   Medication Adjustments/Labs and Tests Ordered: Current medicines are reviewed at length with the patient today.  Concerns regarding medicines are outlined above.  No orders of the defined types were placed in this encounter.   No orders of the defined types were placed in this encounter.   Signed, Fransico Him, MD  07/21/2022 11:53 AM    [place]

## 2022-07-21 NOTE — Addendum Note (Signed)
Addended by: Bernestine Amass on: 07/21/2022 12:58 PM   Modules accepted: Orders

## 2022-07-21 NOTE — Patient Instructions (Signed)
Medication Instructions:  Your physician recommends that you continue on your current medications as directed. Please refer to the Current Medication list given to you today.  *If you need a refill on your cardiac medications before your next appointment, please call your pharmacy*   Lab Work: None. If you have labs (blood work) drawn today and your tests are completely normal, you will receive your results only by: [place] (if you have MyChart) OR A paper copy in the mail If you have any lab test that is abnormal or we need to change your treatment, we will call you to review the results.   Testing/Procedures: None.   Follow-Up: At [facility], you and your health needs are our priority.  As part of our continuing mission to provide you with exceptional heart care, we have created designated Provider Care Teams.  These Care Teams include your primary Cardiologist (physician) and Advanced Practice Providers (APPs -  Physician Assistants and Nurse Practitioners) who all work together to provide you with the care you need, when you need it.  We recommend signing up for the patient portal called "MyChart".  Sign up information is provided on this After Visit Summary.  MyChart is used to connect with patients for Virtual Visits (Telemedicine).  Patients are able to view lab/test results, encounter notes, upcoming appointments, etc.  Non-urgent messages can be sent to your provider as well.   To learn more about what you can do with MyChart, go to [email or web address].    Your next appointment:   1 year(s)  The format for your next appointment:   In Person  Provider:   Fransico Him, MD      Important Information About Sugar

## 2022-08-03 ENCOUNTER — Other Ambulatory Visit: Payer: Self-pay | Admitting: Emergency Medicine

## 2022-08-03 DIAGNOSIS — E1165 Type 2 diabetes mellitus with hyperglycemia: Secondary | ICD-10-CM

## 2022-09-20 ENCOUNTER — Ambulatory Visit: Payer: Medicare Other | Admitting: Cardiology

## 2022-09-26 ENCOUNTER — Other Ambulatory Visit: Payer: Self-pay | Admitting: Cardiology

## 2022-09-28 DIAGNOSIS — N5201 Erectile dysfunction due to arterial insufficiency: Secondary | ICD-10-CM | POA: Diagnosis not present

## 2022-09-28 DIAGNOSIS — Z8546 Personal history of malignant neoplasm of prostate: Secondary | ICD-10-CM | POA: Diagnosis not present

## 2022-10-07 ENCOUNTER — Other Ambulatory Visit: Payer: Self-pay | Admitting: Cardiology

## 2023-01-09 ENCOUNTER — Encounter: Payer: Self-pay | Admitting: Emergency Medicine

## 2023-01-09 ENCOUNTER — Ambulatory Visit (INDEPENDENT_AMBULATORY_CARE_PROVIDER_SITE_OTHER): Payer: Medicare HMO | Admitting: Emergency Medicine

## 2023-01-09 VITALS — BP 130/68 | HR 55 | Temp 98.4°F | Ht 65.0 in | Wt 177.5 lb

## 2023-01-09 DIAGNOSIS — Z7984 Long term (current) use of oral hypoglycemic drugs: Secondary | ICD-10-CM

## 2023-01-09 DIAGNOSIS — E785 Hyperlipidemia, unspecified: Secondary | ICD-10-CM

## 2023-01-09 DIAGNOSIS — I700 Atherosclerosis of aorta: Secondary | ICD-10-CM

## 2023-01-09 DIAGNOSIS — E1169 Type 2 diabetes mellitus with other specified complication: Secondary | ICD-10-CM | POA: Diagnosis not present

## 2023-01-09 DIAGNOSIS — I495 Sick sinus syndrome: Secondary | ICD-10-CM

## 2023-01-09 DIAGNOSIS — I2510 Atherosclerotic heart disease of native coronary artery without angina pectoris: Secondary | ICD-10-CM

## 2023-01-09 DIAGNOSIS — I152 Hypertension secondary to endocrine disorders: Secondary | ICD-10-CM

## 2023-01-09 DIAGNOSIS — E1159 Type 2 diabetes mellitus with other circulatory complications: Secondary | ICD-10-CM | POA: Diagnosis not present

## 2023-01-09 LAB — CBC WITH DIFFERENTIAL/PLATELET
Basophils Absolute: 0 10*3/uL (ref 0.0–0.1)
Basophils Relative: 0.4 % (ref 0.0–3.0)
Eosinophils Absolute: 0.1 10*3/uL (ref 0.0–0.7)
Eosinophils Relative: 1.1 % (ref 0.0–5.0)
HCT: 39 % (ref 39.0–52.0)
Hemoglobin: 13 g/dL (ref 13.0–17.0)
Lymphocytes Relative: 25.1 % (ref 12.0–46.0)
Lymphs Abs: 2 10*3/uL (ref 0.7–4.0)
MCHC: 33.2 g/dL (ref 30.0–36.0)
MCV: 90 fl (ref 78.0–100.0)
Monocytes Absolute: 0.6 10*3/uL (ref 0.1–1.0)
Monocytes Relative: 7.1 % (ref 3.0–12.0)
Neutro Abs: 5.2 10*3/uL (ref 1.4–7.7)
Neutrophils Relative %: 66.3 % (ref 43.0–77.0)
Platelets: 270 10*3/uL (ref 150.0–400.0)
RBC: 4.34 Mil/uL (ref 4.22–5.81)
RDW: 13.7 % (ref 11.5–15.5)
WBC: 7.8 10*3/uL (ref 4.0–10.5)

## 2023-01-09 LAB — COMPREHENSIVE METABOLIC PANEL
ALT: 19 U/L (ref 0–53)
AST: 17 U/L (ref 0–37)
Albumin: 4.4 g/dL (ref 3.5–5.2)
Alkaline Phosphatase: 49 U/L (ref 39–117)
BUN: 12 mg/dL (ref 6–23)
CO2: 26 mEq/L (ref 19–32)
Calcium: 9.2 mg/dL (ref 8.4–10.5)
Chloride: 105 mEq/L (ref 96–112)
Creatinine, Ser: 0.79 mg/dL (ref 0.40–1.50)
GFR: 89.47 mL/min (ref >60.00)
Glucose, Bld: 126 mg/dL — ABNORMAL HIGH (ref 70–99)
Potassium: 4.3 mEq/L (ref 3.5–5.1)
Sodium: 137 mEq/L (ref 135–145)
Total Bilirubin: 0.4 mg/dL (ref 0.2–1.2)
Total Protein: 7.2 g/dL (ref 6.0–8.3)

## 2023-01-09 LAB — POCT GLYCOSYLATED HEMOGLOBIN (HGB A1C): Hemoglobin A1C: 6.6 % — AB (ref 4.0–5.6)

## 2023-01-09 LAB — LIPID PANEL
Cholesterol: 114 mg/dL (ref 0–200)
HDL: 43.5 mg/dL (ref >39.00)
LDL Cholesterol: 45 mg/dL (ref 0–99)
NonHDL: 70.15
Total CHOL/HDL Ratio: 3
Triglycerides: 126 mg/dL (ref 0.0–149.0)
VLDL: 25.2 mg/dL (ref 0.0–40.0)

## 2023-01-09 NOTE — Assessment & Plan Note (Signed)
Well-controlled hypertension Continue diltiazem 180 mg daily Well-controlled diabetes with hemoglobin A1c of 6.6 Continue Janumet 50-500 mg twice a day Cardiovascular risks associated with diabetes and hypertension discussed Dietary approaches to stop hypertension discussed Blood work done today Follow-up in 6 months

## 2023-01-09 NOTE — Patient Instructions (Signed)
Health Maintenance After Age 71 After age 71, you are at a higher risk for certain long-term diseases and infections as well as injuries from falls. Falls are a major cause of broken bones and head injuries in people who are older than age 71. Getting regular preventive care can help to keep you healthy and well. Preventive care includes getting regular testing and making lifestyle changes as recommended by your health care provider. Talk with your health care provider about: Which screenings and tests you should have. A screening is a test that checks for a disease when you have no symptoms. A diet and exercise plan that is right for you. What should I know about screenings and tests to prevent falls? Screening and testing are the best ways to find a health problem early. Early diagnosis and treatment give you the best chance of managing medical conditions that are common after age 71. Certain conditions and lifestyle choices may make you more likely to have a fall. Your health care provider may recommend: Regular vision checks. Poor vision and conditions such as cataracts can make you more likely to have a fall. If you wear glasses, make sure to get your prescription updated if your vision changes. Medicine review. Work with your health care provider to regularly review all of the medicines you are taking, including over-the-counter medicines. Ask your health care provider about any side effects that may make you more likely to have a fall. Tell your health care provider if any medicines that you take make you feel dizzy or sleepy. Strength and balance checks. Your health care provider may recommend certain tests to check your strength and balance while standing, walking, or changing positions. Foot health exam. Foot pain and numbness, as well as not wearing proper footwear, can make you more likely to have a fall. Screenings, including: Osteoporosis screening. Osteoporosis is a condition that causes  the bones to get weaker and break more easily. Blood pressure screening. Blood pressure changes and medicines to control blood pressure can make you feel dizzy. Depression screening. You may be more likely to have a fall if you have a fear of falling, feel depressed, or feel unable to do activities that you used to do. Alcohol use screening. Using too much alcohol can affect your balance and may make you more likely to have a fall. Follow these instructions at home: Lifestyle Do not drink alcohol if: Your health care provider tells you not to drink. If you drink alcohol: Limit how much you have to: 0-1 drink a day for women. 0-2 drinks a day for men. Know how much alcohol is in your drink. In the [place] one drink equals one 12 oz bottle of beer (355 mL), one 5 oz glass of wine (148 mL), or one 1 oz glass of hard liquor (44 mL). Do not use any products that contain nicotine or tobacco. These products include cigarettes, chewing tobacco, and vaping devices, such as e-cigarettes. If you need help quitting, ask your health care provider. Activity  Follow a regular exercise program to stay fit. This will help you maintain your balance. Ask your health care provider what types of exercise are appropriate for you. If you need a cane or walker, use it as recommended by your health care provider. Wear supportive shoes that have nonskid soles. Safety  Remove any tripping hazards, such as rugs, cords, and clutter. Install safety equipment such as grab bars in bathrooms and safety rails on stairs. Keep rooms and walkways   well-lit. General instructions Talk with your health care provider about your risks for falling. Tell your health care provider if: You fall. Be sure to tell your health care provider about all falls, even ones that seem minor. You feel dizzy, tiredness (fatigue), or off-balance. Take over-the-counter and prescription medicines only as told by your health care provider. These include  supplements. Eat a healthy diet and maintain a healthy weight. A healthy diet includes low-fat dairy products, low-fat (lean) meats, and fiber from whole grains, beans, and lots of fruits and vegetables. Stay current with your vaccines. Schedule regular health, dental, and eye exams. Summary Having a healthy lifestyle and getting preventive care can help to protect your health and wellness after age 71. Screening and testing are the best way to find a health problem early and help you avoid having a fall. Early diagnosis and treatment give you the best chance for managing medical conditions that are more common for people who are older than age 71. Falls are a major cause of broken bones and head injuries in people who are older than age 71. Take precautions to prevent a fall at home. Work with your health care provider to learn what changes you can make to improve your health and wellness and to prevent falls. This information is not intended to replace advice given to you by your health care provider. Make sure you discuss any questions you have with your health care provider. Document Revised: 12/07/2020 Document Reviewed: 12/07/2020 Elsevier Patient Education  2024 [employer]  

## 2023-01-09 NOTE — Progress Notes (Signed)
Kenneth Velez 72 y.o.   Chief Complaint  Patient presents with   Medical Management of Chronic Issues    f/u appt, no concerns     HISTORY OF PRESENT ILLNESS: This is a 72 y.o. male here for 80-month follow-up of chronic medical conditions including hypertension and diabetes. Overall doing well.  Has no complaints or medical concerns today.  BP Readings from Last 3 Encounters:  01/09/23 130/68  07/21/22 124/60  07/07/22 130/68   Wt Readings from Last 3 Encounters:  01/09/23 177 lb 8 oz (80.5 kg)  07/21/22 177 lb (80.3 kg)  07/07/22 176 lb 3.2 oz (79.9 kg)     HPI   Prior to Admission medications   Medication Sig Start Date End Date Taking? Authorizing Provider  aspirin EC 81 MG tablet Take 1 tablet (81 mg total) by mouth daily. 02/25/19  Yes Quintella Reichert, MD  atorvastatin (LIPITOR) 80 MG tablet Take 1 tablet by mouth once daily 09/26/22  Yes Turner, Cornelious Bryant, MD  diltiazem (CARDIZEM CD) 180 MG 24 hr capsule Take 1 capsule by mouth once daily 09/26/22  Yes Turner, Cornelious Bryant, MD  flecainide (TAMBOCOR) 50 MG tablet Take 1 tablet by mouth twice daily 10/07/22  Yes Turner, Cornelious Bryant, MD  sitaGLIPtin-metformin (JANUMET) 50-500 MG tablet TAKE 1 TABLET BY MOUTH TWICE DAILY WITH A MEAL 08/03/22  Yes Aquanetta Schwarz, Eilleen Kempf, MD  tadalafil (CIALIS) 20 MG tablet Take 20 mg by mouth every other day. 07/30/19  Yes [provider]    No Known Allergies  Patient Active Problem List   Diagnosis Date Noted   CAD (coronary artery disease), native coronary artery 04/19/2021   Atherosclerosis of aorta (HCC) 03/29/2021   Dyslipidemia associated with type 2 diabetes mellitus (HCC) 06/23/2020   Interstitial pulmonary disease (HCC) 06/23/2020   Hypertension associated with diabetes (HCC) 10/08/2019   Sinus node dysfunction (HCC) 10/08/2019   SVT (supraventricular tachycardia) 07/18/2018   Wide-complex tachycardia 07/18/2018   Exertional angina 06/04/2018   Malignant neoplasm of  prostate (HCC) 04/20/2015   Osteoarthritis of left hip 09/21/2012    Past Medical History:  Diagnosis Date   Arthritis    CAD (coronary artery disease), native coronary artery    mild ASCAD with coronary CTA showing mild CAD in the prox to mid RCA 25-49% and minimal calcified plaque in the ostial and mid LAD and prox LCX.  Calcium score was 23.  FFR showed no flow limiting lesion.   Diabetes mellitus without complication (HCC)    HLD (hyperlipidemia)    HTN (hypertension), benign    Malignant neoplasm of prostate (HCC) 04/20/2015   Osteoarthritis of left hip 09/21/2012   Prostate cancer [facility])     Past Surgical History:  Procedure Laterality Date   JOINT REPLACEMENT     PROSTATE BIOPSY     TOTAL HIP ARTHROPLASTY Left 09/21/2012   Dr Madelon Lips   TOTAL HIP ARTHROPLASTY Left 09/21/2012   Procedure: TOTAL HIP ARTHROPLASTY;  Surgeon: Thera Flake., MD;  Location: [facility] OR;  Service: Orthopedics;  Laterality: Left;    Social History   Socioeconomic History   Marital status: Married    Spouse name: Not on file   Number of children: Not on file   Years of education: Not on file   Highest education level: Not on file  Occupational History   Not on file  Tobacco Use   Smoking status: Never   Smokeless tobacco: Never  Vaping Use   Vaping  Use: Never used  Substance and Sexual Activity   Alcohol use: No    Comment: occasional   Drug use: No   Sexual activity: Yes  Other Topics Concern   Not on file  Social History Narrative   Not on file   Social Determinants of Health   Financial Resource Strain: Not on file  Food Insecurity: Not on file  Transportation Needs: Not on file  Physical Activity: Not on file  Stress: Not on file  Social Connections: Not on file  Intimate Partner Violence: Not on file    Family History  Problem Relation Age of Onset   Diabetes Mother    Colon cancer Father    Pancreatic cancer Neg Hx    Rectal cancer Neg Hx    Stomach cancer Neg Hx     Esophageal cancer Neg Hx    Cancer Neg Hx      Review of Systems  Constitutional: Negative.  Negative for chills and fever.  HENT: Negative.  Negative for congestion and sore throat.   Respiratory: Negative.  Negative for cough and shortness of breath.   Cardiovascular: Negative.  Negative for chest pain and palpitations.  Gastrointestinal:  Negative for abdominal pain, diarrhea, nausea and vomiting.  Genitourinary: Negative.  Negative for dysuria and hematuria.  Skin: Negative.  Negative for rash.  Neurological: Negative.  Negative for dizziness and headaches.  All other systems reviewed and are negative.   Vitals:   01/09/23 1002  BP: 130/68  Pulse: (!) 55  Temp: 98.4 F (36.9 C)  SpO2: 96%    Physical Exam Vitals reviewed.  Constitutional:      Appearance: Normal appearance.  HENT:     Head: Normocephalic.     Mouth/Throat:     Mouth: Mucous membranes are moist.     Pharynx: Oropharynx is clear.  Eyes:     Extraocular Movements: Extraocular movements intact.     Conjunctiva/sclera: Conjunctivae normal.     Pupils: Pupils are equal, round, and reactive to light.  Cardiovascular:     Rate and Rhythm: Normal rate and regular rhythm.     Pulses: Normal pulses.     Heart sounds: Normal heart sounds.  Pulmonary:     Effort: Pulmonary effort is normal.     Breath sounds: Normal breath sounds.  Abdominal:     Palpations: Abdomen is soft.     Tenderness: There is no abdominal tenderness.  Musculoskeletal:     Cervical back: No tenderness.  Lymphadenopathy:     Cervical: No cervical adenopathy.  Skin:    General: Skin is warm and dry.     Capillary Refill: Capillary refill takes less than 2 seconds.  Neurological:     General: No focal deficit present.     Mental Status: He is alert and oriented to person, place, and time.  Psychiatric:        Mood and Affect: Mood normal.        Behavior: Behavior normal.    Results for orders placed or performed in visit on  01/09/23 (from the past 24 hour(s))  POCT HgB A1C     Status: Abnormal   Collection Time: 01/09/23 10:58 AM  Result Value Ref Range   Hemoglobin A1C 6.6 (A) 4.0 - 5.6 %   HbA1c POC (<> result, manual entry)     HbA1c, POC (prediabetic range)     HbA1c, POC (controlled diabetic range)       ASSESSMENT & PLAN: A total of  42 minutes was spent with the patient and counseling/coordination of care regarding preparing for this visit, review of most recent office visit notes, review of multiple chronic medical conditions and their management, review of all medications, cardiovascular risks associated with hypertension and diabetes, review of most recent blood work results including interpretation of today's hemoglobin A1c, education on nutrition, prognosis, documentation, need for follow-up.  Problem List Items Addressed This Visit       Cardiovascular and Mediastinum   Hypertension associated with diabetes (HCC) - Primary    Well-controlled hypertension Continue diltiazem 180 mg daily Well-controlled diabetes with hemoglobin A1c of 6.6 Continue Janumet 50-500 mg twice a day Cardiovascular risks associated with diabetes and hypertension discussed Dietary approaches to stop hypertension discussed Blood work done today Follow-up in 6 months      Relevant Orders   POCT HgB A1C   CBC with Differential/Platelet   Comprehensive metabolic panel   Lipid panel   Sinus node dysfunction (HCC)    Stable. Continues Tambocor 50 mg twice daily Follows up with cardiologist on a regular basis Continues on daily baby aspirin      Atherosclerosis of aorta (HCC)    Continues atorvastatin 80 mg daily Diet and nutrition discussed      CAD (coronary artery disease), native coronary artery    No recent anginal episodes Continues diltiazem for blood pressure control Continues Tambocor for rhythm control Continues 1 daily baby aspirin        Endocrine   Dyslipidemia associated with type 2  diabetes mellitus (HCC)    Chronic stable condition Continue atorvastatin 80 mg daily Diet and nutrition discussed      Relevant Orders   POCT HgB A1C   CBC with Differential/Platelet   Comprehensive metabolic panel   Lipid panel   Patient Instructions  Health Maintenance After Age 19 After age 20, you are at a higher risk for certain long-term diseases and infections as well as injuries from falls. Falls are a major cause of broken bones and head injuries in people who are older than age 33. Getting regular preventive care can help to keep you healthy and well. Preventive care includes getting regular testing and making lifestyle changes as recommended by your health care provider. Talk with your health care provider about: Which screenings and tests you should have. A screening is a test that checks for a disease when you have no symptoms. A diet and exercise plan that is right for you. What should I know about screenings and tests to prevent falls? Screening and testing are the best ways to find a health problem early. Early diagnosis and treatment give you the best chance of managing medical conditions that are common after age 67. Certain conditions and lifestyle choices may make you more likely to have a fall. Your health care provider may recommend: Regular vision checks. Poor vision and conditions such as cataracts can make you more likely to have a fall. If you wear glasses, make sure to get your prescription updated if your vision changes. Medicine review. Work with your health care provider to regularly review all of the medicines you are taking, including over-the-counter medicines. Ask your health care provider about any side effects that may make you more likely to have a fall. Tell your health care provider if any medicines that you take make you feel dizzy or sleepy. Strength and balance checks. Your health care provider may recommend certain tests to check your strength and  balance while standing, walking,  or changing positions. Foot health exam. Foot pain and numbness, as well as not wearing proper footwear, can make you more likely to have a fall. Screenings, including: Osteoporosis screening. Osteoporosis is a condition that causes the bones to get weaker and break more easily. Blood pressure screening. Blood pressure changes and medicines to control blood pressure can make you feel dizzy. Depression screening. You may be more likely to have a fall if you have a fear of falling, feel depressed, or feel unable to do activities that you used to do. Alcohol use screening. Using too much alcohol can affect your balance and may make you more likely to have a fall. Follow these instructions at home: Lifestyle Do not drink alcohol if: Your health care provider tells you not to drink. If you drink alcohol: Limit how much you have to: 0-1 drink a day for women. 0-2 drinks a day for men. Know how much alcohol is in your drink. In the [place] one drink equals one 12 oz bottle of beer (355 mL), one 5 oz glass of wine (148 mL), or one 1 oz glass of hard liquor (44 mL). Do not use any products that contain nicotine or tobacco. These products include cigarettes, chewing tobacco, and vaping devices, such as e-cigarettes. If you need help quitting, ask your health care provider. Activity  Follow a regular exercise program to stay fit. This will help you maintain your balance. Ask your health care provider what types of exercise are appropriate for you. If you need a cane or walker, use it as recommended by your health care provider. Wear supportive shoes that have nonskid soles. Safety  Remove any tripping hazards, such as rugs, cords, and clutter. Install safety equipment such as grab bars in bathrooms and safety rails on stairs. Keep rooms and walkways well-lit. General instructions Talk with your health care provider about your risks for falling. Tell your health care  provider if: You fall. Be sure to tell your health care provider about all falls, even ones that seem minor. You feel dizzy, tiredness (fatigue), or off-balance. Take over-the-counter and prescription medicines only as told by your health care provider. These include supplements. Eat a healthy diet and maintain a healthy weight. A healthy diet includes low-fat dairy products, low-fat (lean) meats, and fiber from whole grains, beans, and lots of fruits and vegetables. Stay current with your vaccines. Schedule regular health, dental, and eye exams. Summary Having a healthy lifestyle and getting preventive care can help to protect your health and wellness after age 72. Screening and testing are the best way to find a health problem early and help you avoid having a fall. Early diagnosis and treatment give you the best chance for managing medical conditions that are more common for people who are older than age 69. Falls are a major cause of broken bones and head injuries in people who are older than age 9. Take precautions to prevent a fall at home. Work with your health care provider to learn what changes you can make to improve your health and wellness and to prevent falls. This information is not intended to replace advice given to you by your health care provider. Make sure you discuss any questions you have with your health care provider. Document Revised: 12/07/2020 Document Reviewed: 12/07/2020 Elsevier Patient Education  2024 [employer]    Edwina Barth, MD  [facility]

## 2023-01-09 NOTE — Assessment & Plan Note (Signed)
Chronic stable condition Continue atorvastatin 80 mg daily Diet and nutrition discussed 

## 2023-01-09 NOTE — Assessment & Plan Note (Signed)
Continues atorvastatin 80 mg daily Diet and nutrition discussed

## 2023-01-09 NOTE — Assessment & Plan Note (Signed)
No recent anginal episodes Continues diltiazem for blood pressure control Continues Tambocor for rhythm control Continues 1 daily baby aspirin

## 2023-01-09 NOTE — Assessment & Plan Note (Signed)
Stable. Continues Tambocor 50 mg twice daily Follows up with cardiologist on a regular basis Continues on daily baby aspirin

## 2023-05-05 ENCOUNTER — Other Ambulatory Visit: Payer: Self-pay | Admitting: Emergency Medicine

## 2023-05-05 DIAGNOSIS — E1165 Type 2 diabetes mellitus with hyperglycemia: Secondary | ICD-10-CM

## 2023-05-16 ENCOUNTER — Ambulatory Visit: Payer: Medicare HMO | Admitting: Emergency Medicine

## 2023-07-11 ENCOUNTER — Encounter: Payer: Self-pay | Admitting: Emergency Medicine

## 2023-07-11 ENCOUNTER — Ambulatory Visit (INDEPENDENT_AMBULATORY_CARE_PROVIDER_SITE_OTHER): Payer: Medicare HMO | Admitting: Emergency Medicine

## 2023-07-11 VITALS — BP 140/78 | HR 61 | Temp 98.4°F | Ht 65.0 in | Wt 173.8 lb

## 2023-07-11 DIAGNOSIS — E1169 Type 2 diabetes mellitus with other specified complication: Secondary | ICD-10-CM

## 2023-07-11 DIAGNOSIS — Z794 Long term (current) use of insulin: Secondary | ICD-10-CM | POA: Diagnosis not present

## 2023-07-11 DIAGNOSIS — I152 Hypertension secondary to endocrine disorders: Secondary | ICD-10-CM

## 2023-07-11 DIAGNOSIS — J849 Interstitial pulmonary disease, unspecified: Secondary | ICD-10-CM | POA: Diagnosis not present

## 2023-07-11 DIAGNOSIS — E785 Hyperlipidemia, unspecified: Secondary | ICD-10-CM | POA: Diagnosis not present

## 2023-07-11 DIAGNOSIS — I700 Atherosclerosis of aorta: Secondary | ICD-10-CM

## 2023-07-11 DIAGNOSIS — I2510 Atherosclerotic heart disease of native coronary artery without angina pectoris: Secondary | ICD-10-CM

## 2023-07-11 DIAGNOSIS — I495 Sick sinus syndrome: Secondary | ICD-10-CM

## 2023-07-11 DIAGNOSIS — E1159 Type 2 diabetes mellitus with other circulatory complications: Secondary | ICD-10-CM

## 2023-07-11 LAB — POCT GLYCOSYLATED HEMOGLOBIN (HGB A1C): Hemoglobin A1C: 6.6 % — AB (ref 4.0–5.6)

## 2023-07-11 NOTE — Assessment & Plan Note (Signed)
Well-controlled hypertension Continue diltiazem 180 mg daily Well-controlled diabetes with hemoglobin A1c of 6.6 Continue Janumet 50-500 mg twice a day Cardiovascular risks associated with diabetes and hypertension discussed Dietary approaches to stop hypertension discussed Blood work done today Follow-up in 6 months

## 2023-07-11 NOTE — Assessment & Plan Note (Signed)
Continues atorvastatin 80 mg daily Diet and nutrition discussed

## 2023-07-11 NOTE — Patient Instructions (Signed)
Health Maintenance After Age 71 After age 71, you are at a higher risk for certain long-term diseases and infections as well as injuries from falls. Falls are a major cause of broken bones and head injuries in people who are older than age 71. Getting regular preventive care can help to keep you healthy and well. Preventive care includes getting regular testing and making lifestyle changes as recommended by your health care provider. Talk with your health care provider about: Which screenings and tests you should have. A screening is a test that checks for a disease when you have no symptoms. A diet and exercise plan that is right for you. What should I know about screenings and tests to prevent falls? Screening and testing are the best ways to find a health problem early. Early diagnosis and treatment give you the best chance of managing medical conditions that are common after age 71. Certain conditions and lifestyle choices may make you more likely to have a fall. Your health care provider may recommend: Regular vision checks. Poor vision and conditions such as cataracts can make you more likely to have a fall. If you wear glasses, make sure to get your prescription updated if your vision changes. Medicine review. Work with your health care provider to regularly review all of the medicines you are taking, including over-the-counter medicines. Ask your health care provider about any side effects that may make you more likely to have a fall. Tell your health care provider if any medicines that you take make you feel dizzy or sleepy. Strength and balance checks. Your health care provider may recommend certain tests to check your strength and balance while standing, walking, or changing positions. Foot health exam. Foot pain and numbness, as well as not wearing proper footwear, can make you more likely to have a fall. Screenings, including: Osteoporosis screening. Osteoporosis is a condition that causes  the bones to get weaker and break more easily. Blood pressure screening. Blood pressure changes and medicines to control blood pressure can make you feel dizzy. Depression screening. You may be more likely to have a fall if you have a fear of falling, feel depressed, or feel unable to do activities that you used to do. Alcohol use screening. Using too much alcohol can affect your balance and may make you more likely to have a fall. Follow these instructions at home: Lifestyle Do not drink alcohol if: Your health care provider tells you not to drink. If you drink alcohol: Limit how much you have to: 0-1 drink a day for women. 0-2 drinks a day for men. Know how much alcohol is in your drink. In the [place] one drink equals one 12 oz bottle of beer (355 mL), one 5 oz glass of wine (148 mL), or one 1 oz glass of hard liquor (44 mL). Do not use any products that contain nicotine or tobacco. These products include cigarettes, chewing tobacco, and vaping devices, such as e-cigarettes. If you need help quitting, ask your health care provider. Activity  Follow a regular exercise program to stay fit. This will help you maintain your balance. Ask your health care provider what types of exercise are appropriate for you. If you need a cane or walker, use it as recommended by your health care provider. Wear supportive shoes that have nonskid soles. Safety  Remove any tripping hazards, such as rugs, cords, and clutter. Install safety equipment such as grab bars in bathrooms and safety rails on stairs. Keep rooms and walkways   well-lit. General instructions Talk with your health care provider about your risks for falling. Tell your health care provider if: You fall. Be sure to tell your health care provider about all falls, even ones that seem minor. You feel dizzy, tiredness (fatigue), or off-balance. Take over-the-counter and prescription medicines only as told by your health care provider. These include  supplements. Eat a healthy diet and maintain a healthy weight. A healthy diet includes low-fat dairy products, low-fat (lean) meats, and fiber from whole grains, beans, and lots of fruits and vegetables. Stay current with your vaccines. Schedule regular health, dental, and eye exams. Summary Having a healthy lifestyle and getting preventive care can help to protect your health and wellness after age 71. Screening and testing are the best way to find a health problem early and help you avoid having a fall. Early diagnosis and treatment give you the best chance for managing medical conditions that are more common for people who are older than age 71. Falls are a major cause of broken bones and head injuries in people who are older than age 71. Take precautions to prevent a fall at home. Work with your health care provider to learn what changes you can make to improve your health and wellness and to prevent falls. This information is not intended to replace advice given to you by your health care provider. Make sure you discuss any questions you have with your health care provider. Document Revised: 12/07/2020 Document Reviewed: 12/07/2020 Elsevier Patient Education  2024 [employer]  

## 2023-07-11 NOTE — Assessment & Plan Note (Signed)
Chronic stable condition Continue atorvastatin 80 mg daily Diet and nutrition discussed 

## 2023-07-11 NOTE — Assessment & Plan Note (Signed)
Stable. Continues Tambocor 50 mg twice daily Follows up with cardiologist on a regular basis Continues on daily baby aspirin

## 2023-07-11 NOTE — Progress Notes (Signed)
Arkin Bekele Waner 72 y.o.   Chief Complaint  Patient presents with   Follow-up    6 month f/u for HTN. Patient c/o of ribs and chest area hurting when its cold, he has to warm himself up to make it feel better. Patient c/o of being fatigue for 2-3 days then its gone this comes and goes. Also waking up with slight headache every morning for awhile now .     HISTORY OF PRESENT ILLNESS: This is a 72 y.o. male here for 54-month follow-up of chronic medical conditions Overall doing well. Scheduled to follow-up with cardiologist next month Today complains of occasional bilateral rib pains mostly when it is cold Denies difficulty breathing or any other associated symptoms Occasional headaches in the morning lasting minutes and then going away spontaneously No other complaints or medical concerns today.  HPI   Prior to Admission medications   Medication Sig Start Date End Date Taking? Authorizing Provider  aspirin EC 81 MG tablet Take 1 tablet (81 mg total) by mouth daily. 02/25/19  Yes Quintella Reichert, MD  atorvastatin (LIPITOR) 80 MG tablet Take 1 tablet by mouth once daily 09/26/22  Yes Turner, Cornelious Bryant, MD  diltiazem (CARDIZEM CD) 180 MG 24 hr capsule Take 1 capsule by mouth once daily 09/26/22  Yes Turner, Cornelious Bryant, MD  flecainide (TAMBOCOR) 50 MG tablet Take 1 tablet by mouth twice daily 10/07/22  Yes Turner, Cornelious Bryant, MD  sitaGLIPtin-metformin (JANUMET) 50-500 MG tablet TAKE 1 TABLET BY MOUTH TWICE DAILY WITH A MEAL 05/05/23  Yes Chritopher Coster, Eilleen Kempf, MD  tadalafil (CIALIS) 20 MG tablet Take 20 mg by mouth every other day. 07/30/19  Yes [provider]    No Known Allergies  Patient Active Problem List   Diagnosis Date Noted   CAD (coronary artery disease), native coronary artery 04/19/2021   Atherosclerosis of aorta (HCC) 03/29/2021   Dyslipidemia associated with type 2 diabetes mellitus (HCC) 06/23/2020   Interstitial pulmonary disease (HCC) 06/23/2020   Hypertension  associated with diabetes (HCC) 10/08/2019   Sinus node dysfunction (HCC) 10/08/2019   Exertional angina (HCC) 06/04/2018   Malignant neoplasm of prostate (HCC) 04/20/2015   Osteoarthritis of left hip 09/21/2012    Past Medical History:  Diagnosis Date   Arthritis    CAD (coronary artery disease), native coronary artery    mild ASCAD with coronary CTA showing mild CAD in the prox to mid RCA 25-49% and minimal calcified plaque in the ostial and mid LAD and prox LCX.  Calcium score was 23.  FFR showed no flow limiting lesion.   Diabetes mellitus without complication (HCC)    HLD (hyperlipidemia)    HTN (hypertension), benign    Malignant neoplasm of prostate (HCC) 04/20/2015   Osteoarthritis of left hip 09/21/2012   Prostate cancer [facility])     Past Surgical History:  Procedure Laterality Date   JOINT REPLACEMENT     PROSTATE BIOPSY     TOTAL HIP ARTHROPLASTY Left 09/21/2012   Dr Madelon Lips   TOTAL HIP ARTHROPLASTY Left 09/21/2012   Procedure: TOTAL HIP ARTHROPLASTY;  Surgeon: Thera Flake., MD;  Location: [facility] OR;  Service: Orthopedics;  Laterality: Left;    Social History   Socioeconomic History   Marital status: Married    Spouse name: Not on file   Number of children: Not on file   Years of education: Not on file   Highest education level: Not on file  Occupational History   Not on  file  Tobacco Use   Smoking status: Never   Smokeless tobacco: Never  Vaping Use   Vaping status: Never Used  Substance and Sexual Activity   Alcohol use: No    Comment: occasional   Drug use: No   Sexual activity: Yes  Other Topics Concern   Not on file  Social History Narrative   Not on file   Social Determinants of Health   Financial Resource Strain: Not on file  Food Insecurity: Not on file  Transportation Needs: Not on file  Physical Activity: Not on file  Stress: Not on file  Social Connections: Not on file  Intimate Partner Violence: Not on file    Family History  Problem  Relation Age of Onset   Diabetes Mother    Colon cancer Father    Pancreatic cancer Neg Hx    Rectal cancer Neg Hx    Stomach cancer Neg Hx    Esophageal cancer Neg Hx    Cancer Neg Hx      Review of Systems  Constitutional: Negative.  Negative for chills and fever.  HENT: Negative.  Negative for congestion and sore throat.   Respiratory: Negative.  Negative for cough and shortness of breath.   Cardiovascular: Negative.  Negative for chest pain and palpitations.  Gastrointestinal:  Negative for abdominal pain, diarrhea, nausea and vomiting.  Genitourinary: Negative.  Negative for dysuria and hematuria.  Skin: Negative.  Negative for rash.  Neurological: Negative.  Negative for dizziness and headaches.  All other systems reviewed and are negative.   Vitals:   07/11/23 0932  BP: (!) 140/78  Pulse: 61  Temp: 98.4 F (36.9 C)  SpO2: 96%    Physical Exam Vitals reviewed.  Constitutional:      Appearance: Normal appearance.  HENT:     Head: Normocephalic.     Mouth/Throat:     Mouth: Mucous membranes are moist.     Pharynx: Oropharynx is clear.  Eyes:     Extraocular Movements: Extraocular movements intact.     Pupils: Pupils are equal, round, and reactive to light.  Cardiovascular:     Rate and Rhythm: Normal rate and regular rhythm.     Pulses: Normal pulses.     Heart sounds: Normal heart sounds.  Pulmonary:     Effort: Pulmonary effort is normal.     Breath sounds: Normal breath sounds.  Abdominal:     Palpations: Abdomen is soft.     Tenderness: There is no abdominal tenderness.  Musculoskeletal:     Cervical back: No tenderness.     Right lower leg: No edema.     Left lower leg: No edema.  Lymphadenopathy:     Cervical: No cervical adenopathy.  Skin:    General: Skin is warm and dry.  Neurological:     Mental Status: He is alert and oriented to person, place, and time.  Psychiatric:        Mood and Affect: Mood normal.        Behavior: Behavior  normal.    Results for orders placed or performed in visit on 07/11/23 (from the past 24 hour(s))  POCT HgB A1C     Status: Abnormal   Collection Time: 07/11/23  9:58 AM  Result Value Ref Range   Hemoglobin A1C 6.6 (A) 4.0 - 5.6 %   HbA1c POC (<> result, manual entry)     HbA1c, POC (prediabetic range)     HbA1c, POC (controlled diabetic range)  ASSESSMENT & PLAN: A total of 46 minutes was spent with the patient and counseling/coordination of care regarding preparing for this visit, review of most recent office visit notes, review of multiple chronic medical conditions and their management, review of all medications, review of most recent bloodwork results including interpretation of today's hemoglobin A1c, review of health maintenance items, education on nutrition, prognosis, documentation, and need for follow up.   Problem List Items Addressed This Visit       Cardiovascular and Mediastinum   Hypertension associated with diabetes (HCC) - Primary    Well-controlled hypertension Continue diltiazem 180 mg daily Well-controlled diabetes with hemoglobin A1c of 6.6 Continue Janumet 50-500 mg twice a day Cardiovascular risks associated with diabetes and hypertension discussed Dietary approaches to stop hypertension discussed Blood work done today Follow-up in 6 months      Relevant Orders   POCT HgB A1C (Completed)   Sinus node dysfunction (HCC)    Stable. Continues Tambocor 50 mg twice daily Follows up with cardiologist on a regular basis Continues on daily baby aspirin      Atherosclerosis of aorta (HCC)    Continues atorvastatin 80 mg daily Diet and nutrition discussed      CAD (coronary artery disease), native coronary artery    No recent anginal episodes Continues diltiazem for blood pressure control Continues Tambocor for rhythm control Continues 1 daily baby aspirin        Respiratory   Interstitial pulmonary disease (HCC)    Stable and asymptomatic.         Endocrine   Dyslipidemia associated with type 2 diabetes mellitus (HCC)    Chronic stable condition Continue atorvastatin 80 mg daily Diet and nutrition discussed      Patient Instructions  Health Maintenance After Age 68 After age 26, you are at a higher risk for certain long-term diseases and infections as well as injuries from falls. Falls are a major cause of broken bones and head injuries in people who are older than age 82. Getting regular preventive care can help to keep you healthy and well. Preventive care includes getting regular testing and making lifestyle changes as recommended by your health care provider. Talk with your health care provider about: Which screenings and tests you should have. A screening is a test that checks for a disease when you have no symptoms. A diet and exercise plan that is right for you. What should I know about screenings and tests to prevent falls? Screening and testing are the best ways to find a health problem early. Early diagnosis and treatment give you the best chance of managing medical conditions that are common after age 84. Certain conditions and lifestyle choices may make you more likely to have a fall. Your health care provider may recommend: Regular vision checks. Poor vision and conditions such as cataracts can make you more likely to have a fall. If you wear glasses, make sure to get your prescription updated if your vision changes. Medicine review. Work with your health care provider to regularly review all of the medicines you are taking, including over-the-counter medicines. Ask your health care provider about any side effects that may make you more likely to have a fall. Tell your health care provider if any medicines that you take make you feel dizzy or sleepy. Strength and balance checks. Your health care provider may recommend certain tests to check your strength and balance while standing, walking, or changing positions. Foot  health exam. Foot pain and numbness,  as well as not wearing proper footwear, can make you more likely to have a fall. Screenings, including: Osteoporosis screening. Osteoporosis is a condition that causes the bones to get weaker and break more easily. Blood pressure screening. Blood pressure changes and medicines to control blood pressure can make you feel dizzy. Depression screening. You may be more likely to have a fall if you have a fear of falling, feel depressed, or feel unable to do activities that you used to do. Alcohol use screening. Using too much alcohol can affect your balance and may make you more likely to have a fall. Follow these instructions at home: Lifestyle Do not drink alcohol if: Your health care provider tells you not to drink. If you drink alcohol: Limit how much you have to: 0-1 drink a day for women. 0-2 drinks a day for men. Know how much alcohol is in your drink. In the [place] one drink equals one 12 oz bottle of beer (355 mL), one 5 oz glass of wine (148 mL), or one 1 oz glass of hard liquor (44 mL). Do not use any products that contain nicotine or tobacco. These products include cigarettes, chewing tobacco, and vaping devices, such as e-cigarettes. If you need help quitting, ask your health care provider. Activity  Follow a regular exercise program to stay fit. This will help you maintain your balance. Ask your health care provider what types of exercise are appropriate for you. If you need a cane or walker, use it as recommended by your health care provider. Wear supportive shoes that have nonskid soles. Safety  Remove any tripping hazards, such as rugs, cords, and clutter. Install safety equipment such as grab bars in bathrooms and safety rails on stairs. Keep rooms and walkways well-lit. General instructions Talk with your health care provider about your risks for falling. Tell your health care provider if: You fall. Be sure to tell your health care  provider about all falls, even ones that seem minor. You feel dizzy, tiredness (fatigue), or off-balance. Take over-the-counter and prescription medicines only as told by your health care provider. These include supplements. Eat a healthy diet and maintain a healthy weight. A healthy diet includes low-fat dairy products, low-fat (lean) meats, and fiber from whole grains, beans, and lots of fruits and vegetables. Stay current with your vaccines. Schedule regular health, dental, and eye exams. Summary Having a healthy lifestyle and getting preventive care can help to protect your health and wellness after age 77. Screening and testing are the best way to find a health problem early and help you avoid having a fall. Early diagnosis and treatment give you the best chance for managing medical conditions that are more common for people who are older than age 66. Falls are a major cause of broken bones and head injuries in people who are older than age 26. Take precautions to prevent a fall at home. Work with your health care provider to learn what changes you can make to improve your health and wellness and to prevent falls. This information is not intended to replace advice given to you by your health care provider. Make sure you discuss any questions you have with your health care provider. Document Revised: 12/07/2020 Document Reviewed: 12/07/2020 Elsevier Patient Education  2024 Elsevier Inc.      Edwina Barth, MD [facility]

## 2023-07-11 NOTE — Assessment & Plan Note (Signed)
No recent anginal episodes Continues diltiazem for blood pressure control Continues Tambocor for rhythm control Continues 1 daily baby aspirin

## 2023-07-11 NOTE — Assessment & Plan Note (Signed)
Stable and asymptomatic  

## 2023-07-12 ENCOUNTER — Other Ambulatory Visit: Payer: Self-pay

## 2023-07-12 MED ORDER — ATORVASTATIN CALCIUM 80 MG PO TABS: 80.0000 mg | ORAL_TABLET | Freq: Every day | ORAL | 0 refills | Status: DC

## 2023-07-13 ENCOUNTER — Other Ambulatory Visit: Payer: Self-pay | Admitting: Cardiology

## 2023-08-17 ENCOUNTER — Other Ambulatory Visit: Payer: Self-pay | Admitting: Cardiology

## 2023-09-04 ENCOUNTER — Other Ambulatory Visit: Payer: Self-pay | Admitting: Cardiology

## 2023-09-08 ENCOUNTER — Other Ambulatory Visit: Payer: Self-pay | Admitting: Cardiology

## 2023-09-13 ENCOUNTER — Other Ambulatory Visit: Payer: Self-pay | Admitting: Cardiology

## 2023-09-26 ENCOUNTER — Other Ambulatory Visit: Payer: Self-pay | Admitting: Cardiology

## 2023-09-26 MED ORDER — DILTIAZEM HCL ER COATED BEADS 180 MG PO CP24: ORAL_CAPSULE | ORAL | 0 refills | Status: DC

## 2023-09-26 MED ORDER — ATORVASTATIN CALCIUM 80 MG PO TABS: 80.0000 mg | ORAL_TABLET | Freq: Every day | ORAL | 0 refills | Status: DC

## 2023-09-26 NOTE — Telephone Encounter (Signed)
*  STAT* If patient is at the pharmacy, call can be transferred to refill team.   1. Which medications need to be refilled? (please list name of each medication and dose if known)   atorvastatin (LIPITOR) 80 MG tablet    diltiazem (CARDIZEM CD) 180 MG 24 hr capsule    2. Would you like to learn more about the convenience, safety, & potential cost savings by using the [facility]? Not at this time   3. Are you open to using the [facility] (Type [facility]. Not at this time    4. Which pharmacy/location (including street and city if local pharmacy) is medication to be sent to?  [facility] [address] [place], [place] - [address]     5. Do they need a 30 day or 90 day supply? Need enough to last him until 04/14 appt.    Two medications are needed in addition to flecainide.

## 2023-09-26 NOTE — Addendum Note (Signed)
 Addended by: Adriana Simas, Latonya Knight L on: 09/26/2023 02:38 PM   Modules accepted: Orders

## 2023-11-12 NOTE — Progress Notes (Unsigned)
 Cardiology Office Note:    Date:  11/13/2023   ID:  Kenneth Velez, DOB 08/06/1950, MRN [id number]  PCP:  Elvira Hammersmith, MD  Cardiologist:  Gaylyn Keas, MD    Referring MD: Elvira Hammersmith, *   Chief Complaint  Patient presents with   Coronary Artery Disease   Hypertension   Hyperlipidemia     History of Present Illness:    Kenneth Velez is a 73 y.o. male with a hx of mild ASCAD with coronary CTA showing mild CAD in the prox to mid RCA 25-49% and minimal calcified plaque in the ostial and mid LAD and prox LCX.  Calcium score was 23.  FFR showed no flow limiting lesion.  2D echo for CP and SOB showed normal LVEF 60 to 65% with mild LVH and grade 1 DD, nuclear stress test normal, 24-hour monitor showed sinus bradycardia, normal sinus rhythm and sinus tachycardia average heart rate 61 bpm range 41 to 231 bpm and SVT up to 7 beats at a time, frequent PACs and wide-complex tachycardia up to 11 beats with bigeminal PVCs with  PVC load less than 1%. He was placed on Toprol XL 50 mg in the morning 25mg  in the afternoon.   He continued to have atypical sharp stabbing CP in the back despite coronary CTA showing  mild CAD in the proximal to mid RCA 25 to 49% and minimal calcified plaque in the ostial and mid LAD and proximal circumflex.  Calcium score 23.  FFR no flow-limiting lesions.  CP was very atypical and felt not cardiac in origin.    He was referred to Pulmonary for evaluation which was unremarkable. His cardiopulmonary stress test showed that he may not be able to increase HR with exercise which may be attributing to his SOB. He was seen back by Reesa Cannon, PA in 07/2019 and he was complaining of continued SOB and CP.  His sx were felt to possibly be due to weight and anxiety.  He was also complaining severe fatigue and his Bystolic was stopped and started on Diltiazem for palpitations.  He was seen by Manya Sells, MD 08/22/2019 for ongoing palpitations.  His heart  monitor showed PVCs, NSVT and multiple atrial tachycardias.  He was started on Flecainide.  Nuclear stress test 2022 showed no ischemia.   He is here today for followup and is doing well.  He has chronic chest heaviness that  he has had for a long time in his chest that is sporadic and nonexertional.  He can have it just sitting or in bed. He does belch some with the discomfort but does not relieve the symptoms.  He denies any diaphoresis but does become nauseated some times. His symptoms have not changed since I saw him last. He has chronic SOB that he thinks is unchanged from last OV.  He denies any PND, orthopnea, LE edema, dizziness, palpitations or syncope. He is compliant with his meds and is tolerating meds with no SE.    Past Medical History:  Diagnosis Date   Arthritis    CAD (coronary artery disease), native coronary artery    mild ASCAD with coronary CTA showing mild CAD in the prox to mid RCA 25-49% and minimal calcified plaque in the ostial and mid LAD and prox LCX.  Calcium score was 23.  FFR showed no flow limiting lesion.   Diabetes mellitus without complication (HCC)    HLD (hyperlipidemia)    HTN (hypertension), benign  Malignant neoplasm of prostate (HCC) 04/20/2015   Osteoarthritis of left hip 09/21/2012   Prostate cancer [facility])     Past Surgical History:  Procedure Laterality Date   JOINT REPLACEMENT     PROSTATE BIOPSY     TOTAL HIP ARTHROPLASTY Left 09/21/2012   Dr [name] Silvas   TOTAL HIP ARTHROPLASTY Left 09/21/2012   Procedure: TOTAL HIP ARTHROPLASTY;  Surgeon: Forbes Ida., MD;  Location: [facility] OR;  Service: Orthopedics;  Laterality: Left;    Current Medications: Current Meds  Medication Sig   aspirin EC 81 MG tablet Take 1 tablet (81 mg total) by mouth daily.   atorvastatin (LIPITOR) 80 MG tablet Take 1 tablet (80 mg total) by mouth daily.   diltiazem (CARDIZEM CD) 180 MG 24 hr capsule TAKE 1 CAPSULE BY MOUTH ONCE DAILY .   flecainide (TAMBOCOR) 50 MG tablet  TAKE 1 TABLET BY MOUTH TWICE DAILY . APPOINTMENT REQUIRED FOR FUTURE REFILLS   sitaGLIPtin-metformin (JANUMET) 50-500 MG tablet TAKE 1 TABLET BY MOUTH TWICE DAILY WITH A MEAL   tadalafil (CIALIS) 20 MG tablet Take 20 mg by mouth every other day.     Allergies:   Patient has no known allergies.   Social History   Socioeconomic History   Marital status: Married    Spouse name: Not on file   Number of children: Not on file   Years of education: Not on file   Highest education level: Not on file  Occupational History   Not on file  Tobacco Use   Smoking status: Never   Smokeless tobacco: Never  Vaping Use   Vaping status: Never Used  Substance and Sexual Activity   Alcohol use: No    Comment: occasional   Drug use: No   Sexual activity: Yes  Other Topics Concern   Not on file  Social History Narrative   Not on file   Social Drivers of Health   Financial Resource Strain: Not on file  Food Insecurity: Not on file  Transportation Needs: Not on file  Physical Activity: Not on file  Stress: Not on file  Social Connections: Not on file     Family History: The patient's family history includes Colon cancer in his father; Diabetes in his mother. There is no history of Pancreatic cancer, Rectal cancer, Stomach cancer, Esophageal cancer, or Cancer.  ROS:   Please see the history of present illness.    ROS  All other systems reviewed and negative.   EKGs/Labs/Other Studies Reviewed:    The following studies were reviewed today: EKG. Nuclear stress test, coronary CTA< 2D echo  EKG Interpretation Date/Time:  Monday November 13 2023 08:37:56 EDT Ventricular Rate:  53 PR Interval:  182 QRS Duration:  80 QT Interval:  410 QTC Calculation: 384 R Axis:   37  Text Interpretation: Sinus bradycardia When compared with ECG of 24-May-2018 09:09, Premature atrial complexes are no longer Present Confirmed by Gaylyn Keas ([id number]) on 11/13/2023 8:44:01 AM    Recent Labs: 01/09/2023:  ALT 19; BUN 12; Creatinine, Ser 0.79; Hemoglobin 13.0; Platelets 270.0; Potassium 4.3; Sodium 137   Recent Lipid Panel    Component Value Date/Time   CHOL 114 01/09/2023 1050   CHOL 131 03/23/2020 1245   TRIG 126.0 01/09/2023 1050   HDL 43.50 01/09/2023 1050   HDL 47 03/23/2020 1245   CHOLHDL 3 01/09/2023 1050   VLDL 25.2 01/09/2023 1050   LDLCALC 45 01/09/2023 1050   LDLCALC 71 03/23/2020 1245  Physical Exam:    VS:  BP (!) 142/72   Pulse (!) 53   Ht 5\' 5"  (1.651 m)   Wt 183 lb (83 kg)   SpO2 96%   BMI 30.45 kg/m     Wt Readings from Last 3 Encounters:  11/13/23 183 lb (83 kg)  07/11/23 173 lb 12.8 oz (78.8 kg)  01/09/23 177 lb 8 oz (80.5 kg)  GEN: Well nourished, well developed in no acute distress HEENT: Normal NECK: No JVD; No carotid bruits LYMPHATICS: No lymphadenopathy CARDIAC:RRR, no murmurs, rubs, gallops RESPIRATORY:  Clear to auscultation without rales, wheezing or rhonchi  ABDOMEN: Soft, non-tender, non-distended MUSCULOSKELETAL:  No edema; No deformity  SKIN: Warm and dry NEUROLOGIC:  Alert and oriented x 3 PSYCHIATRIC:  Normal affect  ASSESSMENT:    1. Coronary artery disease of native artery of native heart with stable angina pectoris (HCC)   2. Essential hypertension   3. Hyperlipidemia LDL goal <70   4. SVT (supraventricular tachycardia) (HCC)   5. Sinus node dysfunction (HCC)     PLAN:    In order of problems listed above:  1.  ASCAD -coronary CTA showed mild CAD in the prox to mid RCA 25-49% and minimal calcified plaque in the ostial and mid LAD and prox LCX.  Calcium score was 23.  FFR showed no flow limiting lesion -he has chronic atypical CP that is sharp in his back and chest and not related to exertion with constant chest heaviness -  likely MSK with a high anxiety component was well.   -ETT in Feb 2021 normal and nuclear stress test 04/2021 showed no ischemia -Cardiopulmonary stress test showed normal functional capacity with no  cardiopulmonary limitation but did have a significant chronotropic incompetence and hypertensive response to exercise -Referred to pulmonary with negative workup -BB changed to CCB and started on  Flecainide for PVCs and PACS with resolution of his SOB -He continues to have chronic chest pressure and SOB that are unchanged from prior OVs -I will get a Stress PET CT to rule out ischemia and assess for microvascular disease -Informed Consent   Shared Decision Making/Informed Consent The risks [chest pain, shortness of breath, cardiac arrhythmias, dizziness, blood pressure fluctuations, myocardial infarction, stroke/transient ischemic attack, nausea, vomiting, allergic reaction, radiation exposure, metallic taste sensation and life-threatening complications (estimated to be 1 in 10,000)], benefits (risk stratification, diagnosing coronary artery disease, treatment guidance) and alternatives of a cardiac PET stress test were discussed in detail with Kenneth Velez and he agrees to proceed.   -He will continue Asa 81mg  daily and Atorvastatin 80mg  daily with PRN refills  2.  HTN -BP controlled on exam today -continue Cardizem CD 180mg  daily with PRN refills   3.  HLD -LDL goal < 70 -check FLP and ALT -continue Atorvastatin 80g daily with PRN refills  4.  SVT -no palpitations since I saw him last -continue Cardizem CD 180mg  daily and Flecainide 50mg  BID with PRN refills -ETT in 09/2019 on Flecainide with no exercise induced arrhythmias  -stress test 04/2021 showed no ischemia  5.  Sinus node dysfunction -chronotropic incompetence on CPTX -asymptomatic with HR 53bpm today -no indication per EP for PPM at this time   Medication Adjustments/Labs and Tests Ordered: Current medicines are reviewed at length with the patient today.  Concerns regarding medicines are outlined above.  Orders Placed This Encounter  Procedures   EKG 12-Lead    No orders of the defined types were placed in this  encounter.  Signed, Gaylyn Keas, MD  11/13/2023 8:48 AM    [facility]

## 2023-11-13 ENCOUNTER — Encounter: Payer: Self-pay | Admitting: Cardiology

## 2023-11-13 ENCOUNTER — Ambulatory Visit: Payer: Medicare HMO | Attending: Cardiology | Admitting: Cardiology

## 2023-11-13 VITALS — BP 142/72 | HR 53 | Ht 65.0 in | Wt 183.0 lb

## 2023-11-13 DIAGNOSIS — E785 Hyperlipidemia, unspecified: Secondary | ICD-10-CM | POA: Diagnosis not present

## 2023-11-13 DIAGNOSIS — I471 Supraventricular tachycardia, unspecified: Secondary | ICD-10-CM | POA: Diagnosis not present

## 2023-11-13 DIAGNOSIS — I495 Sick sinus syndrome: Secondary | ICD-10-CM

## 2023-11-13 DIAGNOSIS — I10 Essential (primary) hypertension: Secondary | ICD-10-CM

## 2023-11-13 DIAGNOSIS — R079 Chest pain, unspecified: Secondary | ICD-10-CM

## 2023-11-13 DIAGNOSIS — I25118 Atherosclerotic heart disease of native coronary artery with other forms of angina pectoris: Secondary | ICD-10-CM

## 2023-11-13 LAB — LIPID PANEL
Chol/HDL Ratio: 2.4 ratio (ref 0.0–5.0)
Cholesterol, Total: 115 mg/dL (ref 100–199)
HDL: 47 mg/dL (ref >39)
LDL Chol Calc (NIH): 49 mg/dL (ref 0–99)
Triglycerides: 102 mg/dL (ref 0–149)
VLDL Cholesterol Cal: 19 mg/dL (ref 5–40)

## 2023-11-13 LAB — ALT: ALT: 23 IU/L (ref 0–44)

## 2023-11-13 NOTE — Patient Instructions (Signed)
 Medication Instructions:  Your physician recommends that you continue on your current medications as directed. Please refer to the Current Medication list given to you today.  *If you need a refill on your cardiac medications before your next appointment, please call your pharmacy*  Lab Work: Have lab work done today at [facility] on the first floor--Lipids and ALT If you have labs (blood work) drawn today and your tests are completely normal, you will receive your results only by: MyChart Message (if you have MyChart) OR A paper copy in the mail If you have any lab test that is abnormal or we need to change your treatment, we will call you to review the results.  Testing/Procedures: Dr Mayford Knife recommends you have a cardiac PET stress test  Follow-Up: At [facility], you and your health needs are our priority.  As part of our continuing mission to provide you with exceptional heart care, our providers are all part of one team.  This team includes your primary Cardiologist (physician) and Advanced Practice Providers or APPs (Physician Assistants and Nurse Practitioners) who all work together to provide you with the care you need, when you need it.  Your next appointment:   12 month(s)  Provider:   Armanda Magic, MD     We recommend signing up for the patient portal called "MyChart".  Sign up information is provided on this After Visit Summary.  MyChart is used to connect with patients for Virtual Visits (Telemedicine).  Patients are able to view lab/test results, encounter notes, upcoming appointments, etc.  Non-urgent messages can be sent to your provider as well.   To learn more about what you can do with MyChart, go to [email or web address].   Other Instruction     Please report to Radiology at the [facility] Main Entrance 30 minutes early for your test.  [address] [place], [place] [address]                         OR   Please report to Radiology at  [facility] Main Entrance, medical mall, 30 mins prior to your test.  [address]  [place], [place]  How to Prepare for Your Cardiac PET/CT Stress Test:  Nothing to eat or drink, except water, 3 hours prior to arrival time.  NO caffeine/decaffeinated products, or chocolate 12 hours prior to arrival. (Please note decaffeinated beverages (teas/coffees) still contain caffeine).  If you have caffeine within 12 hours prior, the test will need to be rescheduled.  Medication instructions: Do not take erectile dysfunction medications for 72 hours prior to test (sildenafil, tadalafil) Do not take nitrates (isosorbide mononitrate, Ranexa) the day before or day of test Do not take tamsulosin the day before or morning of test Hold theophylline containing medications for 12 hours. Hold Dipyridamole 48 hours prior to the test.  Diabetic Preparation: If able to eat breakfast prior to 3 hour fasting, you may take all medications, including your insulin. Do not worry if you miss your breakfast dose of insulin - start at your next meal. If you do not eat prior to 3 hour fast-Hold all diabetes (oral and insulin) medications. Patients who wear a continuous glucose monitor MUST remove the device prior to scanning.  You may take your remaining medications with water.  NO perfume, cologne or lotion on chest or abdomen area.   Total time is 1 to 2 hours; you may want to bring reading  material for the waiting time.    In preparation for your appointment, medication and supplies will be purchased.  Appointment availability is limited, so if you need to cancel or reschedule, please call the Radiology Department Scheduler at [phone] 24 hours in advance to avoid a cancellation fee of $100.00  What to Expect When you Arrive:  Once you arrive and check in for your appointment, you will be taken to a preparation room within the Radiology Department.  A technologist or Nurse will  obtain your medical history, verify that you are correctly prepped for the exam, and explain the procedure.  Afterwards, an IV will be started in your arm and electrodes will be placed on your skin for EKG monitoring during the stress portion of the exam. Then you will be escorted to the PET/CT scanner.  There, staff will get you positioned on the scanner and obtain a blood pressure and EKG.  During the exam, you will continue to be connected to the EKG and blood pressure machines.  A small, safe amount of a radioactive tracer will be injected in your IV to obtain a series of pictures of your heart along with an injection of a stress agent.    After your Exam:  It is recommended that you eat a meal and drink a caffeinated beverage to counter act any effects of the stress agent.  Drink plenty of fluids for the remainder of the day and urinate frequently for the first couple of hours after the exam.  Your doctor will inform you of your test results within 7-10 business days.  For more information and frequently asked questions, please visit our website: [email or web address]  For questions about your test or how to prepare for your test, please call: Cardiac Imaging Nurse Navigators Office: [phone]        1st Floor: - Lobby - Registration  - Pharmacy  - Lab - Cafe  2nd Floor: - PV Lab - Diagnostic Testing (echo, CT, nuclear med)  3rd Floor: - Vacant  4th Floor: - TCTS (cardiothoracic surgery) - AFib Clinic - Structural Heart Clinic - Vascular Surgery  - Vascular Ultrasound  5th Floor: - HeartCare Cardiology (general and EP) - Clinical Pharmacy for coumadin, hypertension, lipid, weight-loss medications, and med management appointments    Valet parking services will be available as well.

## 2023-11-13 NOTE — Addendum Note (Signed)
 Addended by: Maiah Sinning L on: 11/13/2023 09:01 AM   Modules accepted: Orders

## 2023-11-13 NOTE — Addendum Note (Signed)
 Addended by: Adrain Nesbit L on: 11/13/2023 09:02 AM   Modules accepted: Orders

## 2023-11-14 ENCOUNTER — Other Ambulatory Visit: Payer: Self-pay | Admitting: Cardiology

## 2023-11-20 NOTE — Addendum Note (Signed)
 Addended by: Jacqueline Matsu on: 11/20/2023 08:36 AM   Modules accepted: Orders

## 2023-12-05 ENCOUNTER — Ambulatory Visit (INDEPENDENT_AMBULATORY_CARE_PROVIDER_SITE_OTHER)

## 2023-12-05 VITALS — Ht 65.0 in | Wt 183.0 lb

## 2023-12-05 DIAGNOSIS — Z0000 Encounter for general adult medical examination without abnormal findings: Secondary | ICD-10-CM

## 2023-12-05 NOTE — Patient Instructions (Addendum)
 Mr. Moure , Thank you for taking time to come for your Medicare Wellness Visit. I appreciate your ongoing commitment to your health goals. Please review the following plan we discussed and let me know if I can assist you in the future.   Referrals/Orders/Follow-Ups/Clinician Recommendations: You are due for a Shingles vaccine and can get these given at your local pharmacy.  You are due for a foot exam, a Hep C screening and a kidney evaluation from urine.  These can be done during your next office visit with Dr. Vedia Geralds.  This is a list of the screening recommended for you and due dates:  Health Maintenance  Topic Date Due   Hepatitis C Screening  Never done   Zoster (Shingles) Vaccine (1 of 2) Never done   COVID-19 Vaccine (3 - Pfizer risk series) 12/17/2019   Complete foot exam   06/23/2021   Eye exam for diabetics  07/20/2021   Yearly kidney health urinalysis for diabetes  07/08/2023   Yearly kidney function blood test for diabetes  01/09/2024   Hemoglobin A1C  01/09/2024   Flu Shot  03/01/2024   Medicare Annual Wellness Visit  12/04/2024   Colon Cancer Screening  09/21/2026   DTaP/Tdap/Td vaccine (2 - Td or Tdap) 11/02/2026   Pneumonia Vaccine  Completed   HPV Vaccine  Aged Out   Meningitis B Vaccine  Aged Out    Advanced directives: (Declined) Advance directive discussed with you today. Even though you declined this today, please call our office should you change your mind, and we can give you the proper paperwork for you to fill out.  Next Medicare Annual Wellness Visit scheduled for next year: Yes  Have you seen your provider in the last 6 months (3 months if uncontrolled diabetes)? Yes.  Last office visit was 07/11/23.

## 2023-12-05 NOTE — Progress Notes (Signed)
 Subjective:   Kenneth Velez is a 73 y.o. who presents for a Medicare Wellness preventive visit.  Visit Complete: Virtual I connected with  Kenneth Velez on 12/05/23 by a audio enabled telemedicine application and verified that I am speaking with the correct person using two identifiers.  Patient Location: Home  Provider Location: Home Office  I discussed the limitations of evaluation and management by telemedicine. The patient expressed understanding and agreed to proceed.  Vital Signs: Because this visit was a virtual/telehealth visit, some criteria may be missing or patient reported. Any vitals not documented were not able to be obtained and vitals that have been documented are patient reported.  VideoDeclined- This patient declined Librarian, academic. Therefore the visit was completed with audio only.  Persons Participating in Visit: Patient.  AWV Questionnaire: No: Patient Medicare AWV questionnaire was not completed prior to this visit.  Cardiac Risk Factors include: advanced age (>50men, >44 women);hypertension;dyslipidemia;diabetes mellitus;male gender;Other (see comment)     Objective:    Today's Vitals   12/05/23 1040  Weight: 183 lb (83 kg)  Height: 5\' 5"  (1.651 m)   Body mass index is 30.45 kg/m.     12/05/2023   10:54 AM 11/09/2015    8:57 AM 12/02/2013    9:15 AM 11/18/2013   10:02 AM 09/21/2012    2:09 PM 09/14/2012   12:22 PM  Advanced Directives  Does Patient Have a Medical Advance Directive? No No Patient does not have advance directive Patient does not have advance directive Patient does not have advance directive Patient does not have advance directive;Patient would like information  Would patient like information on creating a medical advance directive?  Yes - Educational materials given    Advance directive packet given  Pre-existing out of facility DNR order (yellow form or pink MOST form)     No No    Current  Medications (verified) Outpatient Encounter Medications as of 12/05/2023  Medication Sig   aspirin  EC 81 MG tablet Take 1 tablet (81 mg total) by mouth daily.   atorvastatin  (LIPITOR) 80 MG tablet Take 1 tablet (80 mg total) by mouth daily.   diltiazem  (CARDIZEM  CD) 180 MG 24 hr capsule TAKE 1 CAPSULE BY MOUTH ONCE DAILY .   flecainide  (TAMBOCOR ) 50 MG tablet TAKE 1 TABLET BY MOUTH TWICE DAILY . APPOINTMENT REQUIRED FOR FUTURE REFILLS   sitaGLIPtin-metformin (JANUMET ) 50-500 MG tablet TAKE 1 TABLET BY MOUTH TWICE DAILY WITH A MEAL   tadalafil (CIALIS) 20 MG tablet Take 20 mg by mouth every other day.   Facility-Administered Encounter Medications as of 12/05/2023  Medication   regadenoson  (LEXISCAN ) injection SOLN 0.4 mg    Allergies (verified) Patient has no known allergies.   History: Past Medical History:  Diagnosis Date   Arthritis    CAD (coronary artery disease), native coronary artery    mild ASCAD with coronary CTA showing mild CAD in the prox to mid RCA 25-49% and minimal calcified plaque in the ostial and mid LAD and prox LCX.  Calcium  score was 23.  FFR showed no flow limiting lesion.   Diabetes mellitus without complication (HCC)    HLD (hyperlipidemia)    HTN (hypertension), benign    Malignant neoplasm of prostate (HCC) 04/20/2015   Osteoarthritis of left hip 09/21/2012   Prostate cancer [facility])    Past Surgical History:  Procedure Laterality Date   JOINT REPLACEMENT     PROSTATE BIOPSY     TOTAL HIP ARTHROPLASTY  Left 09/21/2012   Dr [name] Silvas   TOTAL HIP ARTHROPLASTY Left 09/21/2012   Procedure: TOTAL HIP ARTHROPLASTY;  Surgeon: Forbes Ida., MD;  Location: [facility] OR;  Service: Orthopedics;  Laterality: Left;   Family History  Problem Relation Age of Onset   Diabetes Mother    Colon cancer Father    Pancreatic cancer Neg Hx    Rectal cancer Neg Hx    Stomach cancer Neg Hx    Esophageal cancer Neg Hx    Cancer Neg Hx    Social History   Socioeconomic History    Marital status: Married    Spouse name: Not on file   Number of children: Not on file   Years of education: Not on file   Highest education level: Not on file  Occupational History   Occupation: RETIRED  Tobacco Use   Smoking status: Never   Smokeless tobacco: Never  Vaping Use   Vaping status: Never Used  Substance and Sexual Activity   Alcohol use: No    Comment: occasional   Drug use: No   Sexual activity: Yes  Other Topics Concern   Not on file  Social History Narrative   Lives with wife   Social Drivers of Health   Financial Resource Strain: Medium Risk (12/05/2023)   Overall Financial Resource Strain (CARDIA)    Difficulty of Paying Living Expenses: Somewhat hard  Food Insecurity: No Food Insecurity (12/05/2023)   Hunger Vital Sign    Worried About Running Out of Food in the Last Year: Never true    Ran Out of Food in the Last Year: Never true  Transportation Needs: No Transportation Needs (12/05/2023)   PRAPARE - Administrator, Civil Service (Medical): No    Lack of Transportation (Non-Medical): No  Physical Activity: Insufficiently Active (12/05/2023)   Exercise Vital Sign    Days of Exercise per Week: 1 day    Minutes of Exercise per Session: 60 min  Stress: No Stress Concern Present (12/05/2023)   [employer] of Occupational Health - Occupational Stress Questionnaire    Feeling of Stress : Not at all  Social Connections: Moderately Integrated (12/05/2023)   Social Connection and Isolation Panel [NHANES]    Frequency of Communication with Friends and Family: Three times a week    Frequency of Social Gatherings with Friends and Family: Once a week    Attends Religious Services: More than 4 times per year    Active Member of [employer] or Organizations: No    Attends Engineer, structural: Never    Marital Status: Married    Tobacco Counseling Counseling given: Not Answered    Clinical Intake:  Pre-visit preparation completed: Yes  Pain :  No/denies pain     BMI - recorded: 30.45 Nutritional Status: BMI > 30  Obese Nutritional Risks: None Diabetes: Yes CBG done?: Yes (149 per pt) CBG resulted in Enter/ Edit results?: No Did pt. bring in CBG monitor from home?: No  Lab Results  Component Value Date   HGBA1C 6.6 (A) 07/11/2023   HGBA1C 6.6 (A) 01/09/2023   HGBA1C 6.4 (A) 07/07/2022     How often do you need to have someone help you when you read instructions, pamphlets, or other written materials from your doctor or pharmacy?: 1 - Never  Interpreter Needed?: No  Information entered by :: Tyjuan Demetro, RMA   Activities of Daily Living     12/05/2023   10:41 AM  In your present  state of health, do you have any difficulty performing the following activities:  Hearing? 0  Vision? 0  Difficulty concentrating or making decisions? 0  Walking or climbing stairs? 0  Dressing or bathing? 0  Doing errands, shopping? 0  Preparing Food and eating ? N  Using the Toilet? N  In the past six months, have you accidently leaked urine? N  Do you have problems with loss of bowel control? N  Managing your Medications? N  Managing your Finances? N  Housekeeping or managing your Housekeeping? N    Patient Care Team: Elvira Hammersmith, MD as PCP - General (Internal Medicine) Jacqueline Matsu, MD as PCP - Cardiology (Cardiology) Ladon Pickler, MD as Referring Physician (Ophthalmology)  Indicate any recent Medical Services you may have received from other than [facility] providers in the past year (date may be approximate).     Assessment:   This is a routine wellness examination for Lavern.  Hearing/Vision screen Hearing Screening - Comments:: Denies hearing difficulties   Vision Screening - Comments:: Denies vision issues.    Goals Addressed   None    Depression Screen     12/05/2023   10:56 AM 07/11/2023    9:44 AM 01/09/2023   10:03 AM 07/07/2022    9:35 AM 03/29/2021   10:30 AM 03/29/2021   10:16 AM  09/28/2020   10:13 AM  PHQ 2/9 Scores  PHQ - 2 Score 0 1 0 0 0 0 0  PHQ- 9 Score 1 6         Fall Risk     12/05/2023   10:54 AM 07/11/2023    9:44 AM 01/09/2023   10:03 AM 07/07/2022    9:35 AM 03/29/2021   10:30 AM  Fall Risk   Falls in the past year? 0 0 0 0 0  Number falls in past yr: 0 0 0 0 0  Injury with Fall? 0 0 0 0 0  Risk for fall due to : No Fall Risks No Fall Risks No Fall Risks No Fall Risks   Follow up Falls prevention discussed;Falls evaluation completed Falls evaluation completed Falls evaluation completed Falls evaluation completed     MEDICARE RISK AT HOME:  Medicare Risk at Home Any stairs in or around the home?: No Home free of loose throw rugs in walkways, pet beds, electrical cords, etc?: Yes Adequate lighting in your home to reduce risk of falls?: Yes Life alert?: No Use of a cane, walker or w/c?: No Grab bars in the bathroom?: No Shower chair or bench in shower?: No Elevated toilet seat or a handicapped toilet?: No  TIMED UP AND GO:  Was the test performed?  No  Cognitive Function: Declined/Normal: No cognitive concerns noted by patient or family. Patient alert, oriented, able to answer questions appropriately and recall recent events. No signs of memory loss or confusion.        Immunizations Immunization History  Administered Date(s) Administered   Fluad Quad(high Dose 65+) 04/30/2020, 03/29/2021   Influenza Split 05/19/2017   Influenza, Seasonal, Injecte, Preservative Fre 05/11/2015   Influenza,inj,Quad PF,6+ Mos 05/11/2015   PFIZER(Purple Top)SARS-COV-2 Vaccination 10/24/2019, 11/19/2019   PNEUMOCOCCAL CONJUGATE-20 07/07/2022   Pneumococcal Conjugate-13 11/01/2016   Tdap 11/01/2016    Screening Tests Health Maintenance  Topic Date Due   Medicare Annual Wellness (AWV)  Never done   Hepatitis C Screening  Never done   Zoster Vaccines- Shingrix (1 of 2) Never done   COVID-19 Vaccine (3 - [employer]  risk series) 12/17/2019   FOOT EXAM   06/23/2021   OPHTHALMOLOGY EXAM  07/20/2021   Diabetic kidney evaluation - Urine ACR  07/08/2023   Diabetic kidney evaluation - eGFR measurement  01/09/2024   HEMOGLOBIN A1C  01/09/2024   INFLUENZA VACCINE  03/01/2024   Colonoscopy  09/21/2026   DTaP/Tdap/Td (2 - Td or Tdap) 11/02/2026   Pneumonia Vaccine 47+ Years old  Completed   HPV VACCINES  Aged Out   Meningococcal B Vaccine  Aged Out    Health Maintenance  Health Maintenance Due  Topic Date Due   Medicare Annual Wellness (AWV)  Never done   Hepatitis C Screening  Never done   Zoster Vaccines- Shingrix (1 of 2) Never done   COVID-19 Vaccine (3 - Pfizer risk series) 12/17/2019   FOOT EXAM  06/23/2021   OPHTHALMOLOGY EXAM  07/20/2021   Diabetic kidney evaluation - Urine ACR  07/08/2023   Health Maintenance Items Addressed: See Nurse Notes  Additional Screening:  Vision Screening: Recommended annual ophthalmology exams for early detection of glaucoma and other disorders of the eye.  Dental Screening: Recommended annual dental exams for proper oral hygiene  Community Resource Referral / Chronic Care Management: CRR required this visit?  No   CCM required this visit?  No     Plan:     I have personally reviewed and noted the following in the patient's chart:   Medical and social history Use of alcohol, tobacco or illicit drugs  Current medications and supplements including opioid prescriptions. Patient is not currently taking opioid prescriptions. Functional ability and status Nutritional status Physical activity Advanced directives List of other physicians Hospitalizations, surgeries, and ER visits in previous 12 months Vitals Screenings to include cognitive, depression, and falls Referrals and appointments  In addition, I have reviewed and discussed with patient certain preventive protocols, quality metrics, and best practice recommendations. A written personalized care plan for preventive services as well  as general preventive health recommendations were provided to patient.     Paolo Okane L Therese Rocco, CMA   12/05/2023   After Visit Summary: (MyChart) Due to this being a telephonic visit, the after visit summary with patients personalized plan was offered to patient via MyChart   Notes: Please refer to Routing Comments.

## 2023-12-13 DIAGNOSIS — H2513 Age-related nuclear cataract, bilateral: Secondary | ICD-10-CM | POA: Diagnosis not present

## 2023-12-13 DIAGNOSIS — H04129 Dry eye syndrome of unspecified lacrimal gland: Secondary | ICD-10-CM | POA: Diagnosis not present

## 2023-12-13 DIAGNOSIS — Z7984 Long term (current) use of oral hypoglycemic drugs: Secondary | ICD-10-CM | POA: Diagnosis not present

## 2023-12-13 DIAGNOSIS — Z9889 Other specified postprocedural states: Secondary | ICD-10-CM | POA: Diagnosis not present

## 2023-12-13 DIAGNOSIS — E1136 Type 2 diabetes mellitus with diabetic cataract: Secondary | ICD-10-CM | POA: Diagnosis not present

## 2024-01-09 ENCOUNTER — Encounter: Payer: Self-pay | Admitting: Emergency Medicine

## 2024-01-09 ENCOUNTER — Ambulatory Visit (INDEPENDENT_AMBULATORY_CARE_PROVIDER_SITE_OTHER): Payer: Medicare HMO | Admitting: Emergency Medicine

## 2024-01-09 VITALS — BP 140/74 | HR 63 | Temp 97.8°F | Ht 65.0 in | Wt 174.0 lb

## 2024-01-09 DIAGNOSIS — E1165 Type 2 diabetes mellitus with hyperglycemia: Secondary | ICD-10-CM | POA: Diagnosis not present

## 2024-01-09 DIAGNOSIS — E1159 Type 2 diabetes mellitus with other circulatory complications: Secondary | ICD-10-CM | POA: Diagnosis not present

## 2024-01-09 DIAGNOSIS — E785 Hyperlipidemia, unspecified: Secondary | ICD-10-CM

## 2024-01-09 DIAGNOSIS — E1169 Type 2 diabetes mellitus with other specified complication: Secondary | ICD-10-CM | POA: Diagnosis not present

## 2024-01-09 DIAGNOSIS — I700 Atherosclerosis of aorta: Secondary | ICD-10-CM | POA: Diagnosis not present

## 2024-01-09 DIAGNOSIS — Z7984 Long term (current) use of oral hypoglycemic drugs: Secondary | ICD-10-CM | POA: Diagnosis not present

## 2024-01-09 DIAGNOSIS — I2089 Other forms of angina pectoris: Secondary | ICD-10-CM

## 2024-01-09 DIAGNOSIS — I152 Hypertension secondary to endocrine disorders: Secondary | ICD-10-CM

## 2024-01-09 LAB — POCT GLYCOSYLATED HEMOGLOBIN (HGB A1C): Hemoglobin A1C: 6.6 % — AB (ref 4.0–5.6)

## 2024-01-09 NOTE — Assessment & Plan Note (Signed)
 Last cardiologist office visit: He continues to have chronic chest pressure and SOB that are unchanged from prior OVs -I will get a Stress PET CT to rule out ischemia and assess for microvascular disease Unremarkable EKG today.  Sinus bradycardia at a rate of 56.  No acute ischemic changes.

## 2024-01-09 NOTE — Assessment & Plan Note (Signed)
Well-controlled hypertension Continue diltiazem 180 mg daily Well-controlled diabetes with hemoglobin A1c of 6.6 Continue Janumet 50-500 mg twice a day Cardiovascular risks associated with diabetes and hypertension discussed Dietary approaches to stop hypertension discussed Blood work done today Follow-up in 6 months

## 2024-01-09 NOTE — Patient Instructions (Signed)
 Health Maintenance After Age 72 After age 4, you are at a higher risk for certain long-term diseases and infections as well as injuries from falls. Falls are a major cause of broken bones and head injuries in people who are older than age 47. Getting regular preventive care can help to keep you healthy and well. Preventive care includes getting regular testing and making lifestyle changes as recommended by your health care provider. Talk with your health care provider about: Which screenings and tests you should have. A screening is a test that checks for a disease when you have no symptoms. A diet and exercise plan that is right for you. What should I know about screenings and tests to prevent falls? Screening and testing are the best ways to find a health problem early. Early diagnosis and treatment give you the best chance of managing medical conditions that are common after age 37. Certain conditions and lifestyle choices may make you more likely to have a fall. Your health care provider may recommend: Regular vision checks. Poor vision and conditions such as cataracts can make you more likely to have a fall. If you wear glasses, make sure to get your prescription updated if your vision changes. Medicine review. Work with your health care provider to regularly review all of the medicines you are taking, including over-the-counter medicines. Ask your health care provider about any side effects that may make you more likely to have a fall. Tell your health care provider if any medicines that you take make you feel dizzy or sleepy. Strength and balance checks. Your health care provider may recommend certain tests to check your strength and balance while standing, walking, or changing positions. Foot health exam. Foot pain and numbness, as well as not wearing proper footwear, can make you more likely to have a fall. Screenings, including: Osteoporosis screening. Osteoporosis is a condition that causes  the bones to get weaker and break more easily. Blood pressure screening. Blood pressure changes and medicines to control blood pressure can make you feel dizzy. Depression screening. You may be more likely to have a fall if you have a fear of falling, feel depressed, or feel unable to do activities that you used to do. Alcohol use screening. Using too much alcohol can affect your balance and may make you more likely to have a fall. Follow these instructions at home: Lifestyle Do not drink alcohol if: Your health care provider tells you not to drink. If you drink alcohol: Limit how much you have to: 0-1 drink a day for women. 0-2 drinks a day for men. Know how much alcohol is in your drink. In the [place] one drink equals one 12 oz bottle of beer (355 mL), one 5 oz glass of wine (148 mL), or one 1 oz glass of hard liquor (44 mL). Do not use any products that contain nicotine or tobacco. These products include cigarettes, chewing tobacco, and vaping devices, such as e-cigarettes. If you need help quitting, ask your health care provider. Activity  Follow a regular exercise program to stay fit. This will help you maintain your balance. Ask your health care provider what types of exercise are appropriate for you. If you need a cane or walker, use it as recommended by your health care provider. Wear supportive shoes that have nonskid soles. Safety  Remove any tripping hazards, such as rugs, cords, and clutter. Install safety equipment such as grab bars in bathrooms and safety rails on stairs. Keep rooms and walkways  well-lit. General instructions Talk with your health care provider about your risks for falling. Tell your health care provider if: You fall. Be sure to tell your health care provider about all falls, even ones that seem minor. You feel dizzy, tiredness (fatigue), or off-balance. Take over-the-counter and prescription medicines only as told by your health care provider. These include  supplements. Eat a healthy diet and maintain a healthy weight. A healthy diet includes low-fat dairy products, low-fat (lean) meats, and fiber from whole grains, beans, and lots of fruits and vegetables. Stay current with your vaccines. Schedule regular health, dental, and eye exams. Summary Having a healthy lifestyle and getting preventive care can help to protect your health and wellness after age 11. Screening and testing are the best way to find a health problem early and help you avoid having a fall. Early diagnosis and treatment give you the best chance for managing medical conditions that are more common for people who are older than age 28. Falls are a major cause of broken bones and head injuries in people who are older than age 48. Take precautions to prevent a fall at home. Work with your health care provider to learn what changes you can make to improve your health and wellness and to prevent falls. This information is not intended to replace advice given to you by your health care provider. Make sure you discuss any questions you have with your health care provider. Document Revised: 12/07/2020 Document Reviewed: 12/07/2020 Elsevier Patient Education  2024 [employer].

## 2024-01-09 NOTE — Progress Notes (Signed)
 Kenneth Velez 73 y.o.   Chief Complaint  Patient presents with   Follow-up    6 month f/u for HTN./ DM     HISTORY OF PRESENT ILLNESS: This is a 73 y.o. male here for 74-month follow-up of hypertension and diabetes Has occasional left-sided chest pain episodes Last cardiology visit 11/13/2023 assessment and plan as follows: ASSESSMENT:     1. Coronary artery disease of native artery of native heart with stable angina pectoris (HCC)   2. Essential hypertension   3. Hyperlipidemia LDL goal <70   4. SVT (supraventricular tachycardia) (HCC)   5. Sinus node dysfunction (HCC)       PLAN:     In order of problems listed above:   1.  ASCAD -coronary CTA showed mild CAD in the prox to mid RCA 25-49% and minimal calcified plaque in the ostial and mid LAD and prox LCX.  Calcium  score was 23.  FFR showed no flow limiting lesion -he has chronic atypical CP that is sharp in his back and chest and not related to exertion with constant chest heaviness -  likely MSK with a high anxiety component was well.   -ETT in Feb 2021 normal and nuclear stress test 04/2021 showed no ischemia -Cardiopulmonary stress test showed normal functional capacity with no cardiopulmonary limitation but did have a significant chronotropic incompetence and hypertensive response to exercise -Referred to pulmonary with negative workup -BB changed to CCB and started on  Flecainide  for PVCs and PACS with resolution of his SOB -He continues to have chronic chest pressure and SOB that are unchanged from prior OVs -I will get a Stress PET CT to rule out ischemia and assess for microvascular disease - Informed Consent Shared Decision Making/Informed Consent The risks [chest pain, shortness of breath, cardiac arrhythmias, dizziness, blood pressure fluctuations, myocardial infarction, stroke/transient ischemic attack, nausea, vomiting, allergic reaction, radiation exposure, metallic taste sensation and life-threatening  complications (estimated to be 1 in 10,000)], benefits (risk stratification, diagnosing coronary artery disease, treatment guidance) and alternatives of a cardiac PET stress test were discussed in detail with Mr. Dupree and he agrees to proceed.   -He will continue Asa 81mg  daily and Atorvastatin  80mg  daily with PRN refills   2.  HTN -BP controlled on exam today -continue Cardizem  CD 180mg  daily with PRN refills   3.  HLD -LDL goal < 70 -check FLP and ALT -continue Atorvastatin  80g daily with PRN refills   4.  SVT  no palpitations since I saw him last -continue Cardizem  CD 180mg  daily and Flecainide  50mg  BID with PRN refills -ETT in 09/2019 on Flecainide  with no exercise induced arrhythmias  -stress test 04/2021 showed no ischemia  5.  Sinus node dysfunction -chronotropic incompetence on CPTX -asymptomatic with HR 53bpm today -no indication per EP for PPM at this time BP Readings from Last 3 Encounters:  01/09/24 (!) 140/74  11/13/23 (!) 142/72  07/11/23 (!) 140/78   Lab Results  Component Value Date   HGBA1C 6.6 (A) 07/11/2023     HPI   Prior to Admission medications   Medication Sig Start Date End Date Taking? Authorizing Provider  aspirin  EC 81 MG tablet Take 1 tablet (81 mg total) by mouth daily. 02/25/19  Yes Turner, Rufus Council, MD  atorvastatin  (LIPITOR) 80 MG tablet Take 1 tablet (80 mg total) by mouth daily. 09/26/23  Yes Turner, Rufus Council, MD  diltiazem  (CARDIZEM  CD) 180 MG 24 hr capsule TAKE 1 CAPSULE BY MOUTH ONCE DAILY .  09/26/23  Yes Turner, Rufus Council, MD  flecainide  (TAMBOCOR ) 50 MG tablet TAKE 1 TABLET BY MOUTH TWICE DAILY . APPOINTMENT REQUIRED FOR FUTURE REFILLS 11/14/23  Yes Turner, Rufus Council, MD  sitaGLIPtin-metformin (JANUMET ) 50-500 MG tablet TAKE 1 TABLET BY MOUTH TWICE DAILY WITH A MEAL 05/05/23  Yes Zadkiel Dragan, Isidro Margo, MD  tadalafil (CIALIS) 20 MG tablet Take 20 mg by mouth every other day. 07/30/19  Yes [provider]    No Known  Allergies  Patient Active Problem List   Diagnosis Date Noted   CAD (coronary artery disease), native coronary artery 04/19/2021   Atherosclerosis of aorta (HCC) 03/29/2021   Dyslipidemia associated with type 2 diabetes mellitus (HCC) 06/23/2020   Interstitial pulmonary disease (HCC) 06/23/2020   Hypertension associated with diabetes (HCC) 10/08/2019   Sinus node dysfunction (HCC) 10/08/2019   Exertional angina (HCC) 06/04/2018   Malignant neoplasm of prostate (HCC) 04/20/2015   Osteoarthritis of left hip 09/21/2012    Past Medical History:  Diagnosis Date   Arthritis    CAD (coronary artery disease), native coronary artery    mild ASCAD with coronary CTA showing mild CAD in the prox to mid RCA 25-49% and minimal calcified plaque in the ostial and mid LAD and prox LCX.  Calcium  score was 23.  FFR showed no flow limiting lesion.   Diabetes mellitus without complication (HCC)    HLD (hyperlipidemia)    HTN (hypertension), benign    Malignant neoplasm of prostate (HCC) 04/20/2015   Osteoarthritis of left hip 09/21/2012   Prostate cancer [facility])     Past Surgical History:  Procedure Laterality Date   JOINT REPLACEMENT     PROSTATE BIOPSY     TOTAL HIP ARTHROPLASTY Left 09/21/2012   Dr [name] Silvas   TOTAL HIP ARTHROPLASTY Left 09/21/2012   Procedure: TOTAL HIP ARTHROPLASTY;  Surgeon: Forbes Ida., MD;  Location: [facility] OR;  Service: Orthopedics;  Laterality: Left;    Social History   Socioeconomic History   Marital status: Married    Spouse name: Not on file   Number of children: Not on file   Years of education: Not on file   Highest education level: Not on file  Occupational History   Occupation: RETIRED  Tobacco Use   Smoking status: Never   Smokeless tobacco: Never  Vaping Use   Vaping status: Never Used  Substance and Sexual Activity   Alcohol use: No    Comment: occasional   Drug use: No   Sexual activity: Yes  Other Topics Concern   Not on file  Social History  Narrative   Lives with wife   Social Drivers of Health   Financial Resource Strain: Medium Risk (12/05/2023)   Overall Financial Resource Strain (CARDIA)    Difficulty of Paying Living Expenses: Somewhat hard  Food Insecurity: No Food Insecurity (12/05/2023)   Hunger Vital Sign    Worried About Running Out of Food in the Last Year: Never true    Ran Out of Food in the Last Year: Never true  Transportation Needs: No Transportation Needs (12/05/2023)   PRAPARE - Administrator, Civil Service (Medical): No    Lack of Transportation (Non-Medical): No  Physical Activity: Insufficiently Active (12/05/2023)   Exercise Vital Sign    Days of Exercise per Week: 1 day    Minutes of Exercise per Session: 60 min  Stress: No Stress Concern Present (12/05/2023)   [employer] of Occupational Health - Occupational Stress Questionnaire  Feeling of Stress : Not at all  Social Connections: Moderately Integrated (12/05/2023)   Social Connection and Isolation Panel [NHANES]    Frequency of Communication with Friends and Family: Three times a week    Frequency of Social Gatherings with Friends and Family: Once a week    Attends Religious Services: More than 4 times per year    Active Member of [employer] or Organizations: No    Attends Banker Meetings: Never    Marital Status: Married  Catering manager Violence: Not At Risk (12/05/2023)   Humiliation, Afraid, Rape, and Kick questionnaire    Fear of Current or Ex-Partner: No    Emotionally Abused: No    Physically Abused: No    Sexually Abused: No    Family History  Problem Relation Age of Onset   Diabetes Mother    Colon cancer Father    Pancreatic cancer Neg Hx    Rectal cancer Neg Hx    Stomach cancer Neg Hx    Esophageal cancer Neg Hx    Cancer Neg Hx      Review of Systems  Constitutional: Negative.  Negative for chills and fever.  HENT: Negative.  Negative for congestion and sore throat.   Respiratory: Negative.   Negative for cough and shortness of breath.   Cardiovascular:  Positive for chest pain. Negative for palpitations.  Gastrointestinal:  Negative for abdominal pain, diarrhea, nausea and vomiting.  Genitourinary: Negative.  Negative for dysuria and hematuria.  Skin: Negative.  Negative for rash.  Neurological: Negative.  Negative for dizziness and headaches.  All other systems reviewed and are negative.   Vitals:   01/09/24 0946  BP: (!) 140/74  Pulse: 63  Temp: 97.8 F (36.6 C)  SpO2: 97%    Physical Exam Vitals reviewed.  Constitutional:      Appearance: Normal appearance.  HENT:     Head: Normocephalic.     Mouth/Throat:     Mouth: Mucous membranes are moist.     Pharynx: Oropharynx is clear.  Eyes:     Extraocular Movements: Extraocular movements intact.     Pupils: Pupils are equal, round, and reactive to light.  Cardiovascular:     Rate and Rhythm: Normal rate and regular rhythm.     Pulses: Normal pulses.     Heart sounds: Normal heart sounds.  Pulmonary:     Effort: Pulmonary effort is normal.     Breath sounds: Normal breath sounds.  Musculoskeletal:     Cervical back: No tenderness.  Lymphadenopathy:     Cervical: No cervical adenopathy.  Skin:    General: Skin is warm and dry.  Neurological:     Mental Status: He is alert and oriented to person, place, and time.  Psychiatric:        Mood and Affect: Mood normal.        Behavior: Behavior normal.    EKG: Sinus bradycardia with ventricular rate of 56.  No acute ischemic changes.  ASSESSMENT & PLAN: A total of 44 minutes was spent with the patient and counseling/coordination of care regarding preparing for this visit, review of most recent office visit notes, review of most recent cardiologist office visit notes, review of multiple chronic medical conditions and their management, review of all medications, review of most recent bloodwork results, review of health maintenance items, education on nutrition,  prognosis, documentation, and need for follow up.   Problem List Items Addressed This Visit       Cardiovascular  and Mediastinum   Exertional angina [facility])   Last cardiologist office visit: He continues to have chronic chest pressure and SOB that are unchanged from prior OVs -I will get a Stress PET CT to rule out ischemia and assess for microvascular disease Unremarkable EKG today.  Sinus bradycardia at a rate of 56.  No acute ischemic changes.      Hypertension associated with diabetes (HCC) - Primary   Well-controlled hypertension Continue diltiazem  180 mg daily Well-controlled diabetes with hemoglobin A1c of 6.6 Continue Janumet  50-500 mg twice a day Cardiovascular risks associated with diabetes and hypertension discussed Dietary approaches to stop hypertension discussed Blood work done today Follow-up in 6 months      Relevant Orders   EKG 12-Lead   Atherosclerosis of aorta (HCC)   Continues atorvastatin  80 mg daily Diet and nutrition discussed        Endocrine   Dyslipidemia associated with type 2 diabetes mellitus (HCC)   Chronic stable condition Continue atorvastatin  80 mg daily Diet and nutrition discussed      Other Visit Diagnoses       Type 2 diabetes mellitus with hyperglycemia, without long-term current use of insulin (HCC)       Relevant Orders   POCT HgB A1C (Completed)        Patient Instructions  Health Maintenance After Age 35 After age 6, you are at a higher risk for certain long-term diseases and infections as well as injuries from falls. Falls are a major cause of broken bones and head injuries in people who are older than age 55. Getting regular preventive care can help to keep you healthy and well. Preventive care includes getting regular testing and making lifestyle changes as recommended by your health care provider. Talk with your health care provider about: Which screenings and tests you should have. A screening is a test that checks  for a disease when you have no symptoms. A diet and exercise plan that is right for you. What should I know about screenings and tests to prevent falls? Screening and testing are the best ways to find a health problem early. Early diagnosis and treatment give you the best chance of managing medical conditions that are common after age 2. Certain conditions and lifestyle choices may make you more likely to have a fall. Your health care provider may recommend: Regular vision checks. Poor vision and conditions such as cataracts can make you more likely to have a fall. If you wear glasses, make sure to get your prescription updated if your vision changes. Medicine review. Work with your health care provider to regularly review all of the medicines you are taking, including over-the-counter medicines. Ask your health care provider about any side effects that may make you more likely to have a fall. Tell your health care provider if any medicines that you take make you feel dizzy or sleepy. Strength and balance checks. Your health care provider may recommend certain tests to check your strength and balance while standing, walking, or changing positions. Foot health exam. Foot pain and numbness, as well as not wearing proper footwear, can make you more likely to have a fall. Screenings, including: Osteoporosis screening. Osteoporosis is a condition that causes the bones to get weaker and break more easily. Blood pressure screening. Blood pressure changes and medicines to control blood pressure can make you feel dizzy. Depression screening. You may be more likely to have a fall if you have a fear of falling, feel depressed, or  feel unable to do activities that you used to do. Alcohol use screening. Using too much alcohol can affect your balance and may make you more likely to have a fall. Follow these instructions at home: Lifestyle Do not drink alcohol if: Your health care provider tells you not to  drink. If you drink alcohol: Limit how much you have to: 0-1 drink a day for women. 0-2 drinks a day for men. Know how much alcohol is in your drink. In the [place] one drink equals one 12 oz bottle of beer (355 mL), one 5 oz glass of wine (148 mL), or one 1 oz glass of hard liquor (44 mL). Do not use any products that contain nicotine or tobacco. These products include cigarettes, chewing tobacco, and vaping devices, such as e-cigarettes. If you need help quitting, ask your health care provider. Activity  Follow a regular exercise program to stay fit. This will help you maintain your balance. Ask your health care provider what types of exercise are appropriate for you. If you need a cane or walker, use it as recommended by your health care provider. Wear supportive shoes that have nonskid soles. Safety  Remove any tripping hazards, such as rugs, cords, and clutter. Install safety equipment such as grab bars in bathrooms and safety rails on stairs. Keep rooms and walkways well-lit. General instructions Talk with your health care provider about your risks for falling. Tell your health care provider if: You fall. Be sure to tell your health care provider about all falls, even ones that seem minor. You feel dizzy, tiredness (fatigue), or off-balance. Take over-the-counter and prescription medicines only as told by your health care provider. These include supplements. Eat a healthy diet and maintain a healthy weight. A healthy diet includes low-fat dairy products, low-fat (lean) meats, and fiber from whole grains, beans, and lots of fruits and vegetables. Stay current with your vaccines. Schedule regular health, dental, and eye exams. Summary Having a healthy lifestyle and getting preventive care can help to protect your health and wellness after age 66. Screening and testing are the best way to find a health problem early and help you avoid having a fall. Early diagnosis and treatment give you  the best chance for managing medical conditions that are more common for people who are older than age 22. Falls are a major cause of broken bones and head injuries in people who are older than age 15. Take precautions to prevent a fall at home. Work with your health care provider to learn what changes you can make to improve your health and wellness and to prevent falls. This information is not intended to replace advice given to you by your health care provider. Make sure you discuss any questions you have with your health care provider. Document Revised: 12/07/2020 Document Reviewed: 12/07/2020 Elsevier Patient Education  2024 Elsevier Inc.    Maryagnes Small, MD [facility]

## 2024-01-09 NOTE — Assessment & Plan Note (Signed)
Chronic stable condition Continue atorvastatin 80 mg daily Diet and nutrition discussed 

## 2024-01-09 NOTE — Assessment & Plan Note (Signed)
Continues atorvastatin 80 mg daily Diet and nutrition discussed

## 2024-01-10 ENCOUNTER — Other Ambulatory Visit: Payer: Self-pay | Admitting: Cardiology

## 2024-02-09 ENCOUNTER — Encounter (HOSPITAL_COMMUNITY): Payer: Self-pay

## 2024-02-09 ENCOUNTER — Other Ambulatory Visit: Payer: Self-pay | Admitting: Emergency Medicine

## 2024-02-09 DIAGNOSIS — E1165 Type 2 diabetes mellitus with hyperglycemia: Secondary | ICD-10-CM

## 2024-02-13 ENCOUNTER — Ambulatory Visit (HOSPITAL_COMMUNITY)
Admission: RE | Admit: 2024-02-13 | Discharge: 2024-02-13 | Disposition: A | Discharge status: Home / Self Care | Source: Ambulatory Visit | Attending: Cardiology | Admitting: Cardiology

## 2024-02-13 ENCOUNTER — Encounter: Payer: Self-pay | Admitting: Cardiology

## 2024-02-13 ENCOUNTER — Ambulatory Visit: Payer: Self-pay | Admitting: Cardiology

## 2024-02-13 DIAGNOSIS — I495 Sick sinus syndrome: Secondary | ICD-10-CM | POA: Diagnosis present

## 2024-02-13 DIAGNOSIS — I25118 Atherosclerotic heart disease of native coronary artery with other forms of angina pectoris: Secondary | ICD-10-CM | POA: Diagnosis not present

## 2024-02-13 DIAGNOSIS — I471 Supraventricular tachycardia, unspecified: Secondary | ICD-10-CM | POA: Diagnosis not present

## 2024-02-13 DIAGNOSIS — I10 Essential (primary) hypertension: Secondary | ICD-10-CM | POA: Diagnosis present

## 2024-02-13 LAB — NM PET CT CARDIAC PERFUSION MULTI W/ABSOLUTE BLOODFLOW
LV dias vol: 87 mL (ref 62–150)
LV sys vol: 41 mL (ref 4.2–5.8)
MBFR: 2.32
Nuc Rest EF: 53 %
Nuc Stress EF: 58 %
Rest MBF: 0.66 ml/g/min
Rest Nuclear Isotope Dose: 21.3 mCi
ST Depression (mm): 0 mm
Stress MBF: 1.53 ml/g/min
Stress Nuclear Isotope Dose: 21.2 mCi

## 2024-02-13 MED ORDER — REGADENOSON 0.4 MG/5ML IV SOLN
0.4000 mg | Freq: Once | INTRAVENOUS | Status: AC
  Administered 2024-02-13: 0.4 mg via INTRAVENOUS

## 2024-02-13 MED ORDER — RUBIDIUM RB82 GENERATOR (RUBYFILL)
21.2600 | PACK | Freq: Once | INTRAVENOUS | Status: AC
  Administered 2024-02-13: 21.26 via INTRAVENOUS

## 2024-02-13 MED ORDER — RUBIDIUM RB82 GENERATOR (RUBYFILL)
21.2000 | PACK | Freq: Once | INTRAVENOUS | Status: AC
  Administered 2024-02-13: 21.2 via INTRAVENOUS

## 2024-02-13 MED ORDER — REGADENOSON 0.4 MG/5ML IV SOLN
INTRAVENOUS | Status: AC
Start: 2024-02-13 — End: 2024-02-13
  Filled 2024-02-13: qty 5

## 2024-03-25 DIAGNOSIS — Z8546 Personal history of malignant neoplasm of prostate: Secondary | ICD-10-CM | POA: Diagnosis not present

## 2024-04-04 DIAGNOSIS — N401 Enlarged prostate with lower urinary tract symptoms: Secondary | ICD-10-CM | POA: Diagnosis not present

## 2024-04-04 DIAGNOSIS — N5201 Erectile dysfunction due to arterial insufficiency: Secondary | ICD-10-CM | POA: Diagnosis not present

## 2024-04-04 DIAGNOSIS — R350 Frequency of micturition: Secondary | ICD-10-CM | POA: Diagnosis not present

## 2024-05-17 ENCOUNTER — Encounter: Payer: Self-pay | Admitting: Pharmacist

## 2024-05-17 NOTE — Progress Notes (Signed)
 Pharmacy Quality Measure Review  This patient is appearing on a report for being at risk of failing the adherence measure for cholesterol (statin) and diabetes medications this calendar year.   Medication: janumet  Last fill date: 05/06/24 for 90 day supply  Medication: Atorvastatin  Last fill date: 04/11/24 for 90 day supply  Janumet  has a 70% PDC for 2025 per Dr. Annemarie. Atorvastatin  showing 94% PDC. Insurance report was not up to date. No action needed at this time.    Darrelyn Drum, PharmD, BCPS, CPP Clinical Pharmacist Practitioner [facility] [phone]

## 2024-06-17 ENCOUNTER — Other Ambulatory Visit: Payer: Self-pay | Admitting: Cardiology

## 2024-07-10 ENCOUNTER — Ambulatory Visit: Admitting: Emergency Medicine

## 2024-07-10 ENCOUNTER — Encounter: Payer: Self-pay | Admitting: Emergency Medicine

## 2024-07-10 ENCOUNTER — Ambulatory Visit: Payer: Self-pay | Admitting: Emergency Medicine

## 2024-07-10 VITALS — BP 142/80 | HR 62 | Temp 98.2°F | Ht 65.0 in | Wt 174.0 lb

## 2024-07-10 DIAGNOSIS — E785 Hyperlipidemia, unspecified: Secondary | ICD-10-CM

## 2024-07-10 DIAGNOSIS — E1169 Type 2 diabetes mellitus with other specified complication: Secondary | ICD-10-CM

## 2024-07-10 DIAGNOSIS — Z8546 Personal history of malignant neoplasm of prostate: Secondary | ICD-10-CM

## 2024-07-10 DIAGNOSIS — I152 Hypertension secondary to endocrine disorders: Secondary | ICD-10-CM | POA: Diagnosis not present

## 2024-07-10 DIAGNOSIS — E559 Vitamin D deficiency, unspecified: Secondary | ICD-10-CM

## 2024-07-10 DIAGNOSIS — R5383 Other fatigue: Secondary | ICD-10-CM | POA: Diagnosis not present

## 2024-07-10 DIAGNOSIS — Z7984 Long term (current) use of oral hypoglycemic drugs: Secondary | ICD-10-CM

## 2024-07-10 DIAGNOSIS — I700 Atherosclerosis of aorta: Secondary | ICD-10-CM

## 2024-07-10 DIAGNOSIS — I2510 Atherosclerotic heart disease of native coronary artery without angina pectoris: Secondary | ICD-10-CM

## 2024-07-10 DIAGNOSIS — E1159 Type 2 diabetes mellitus with other circulatory complications: Secondary | ICD-10-CM | POA: Diagnosis not present

## 2024-07-10 LAB — COMPREHENSIVE METABOLIC PANEL WITH GFR
ALT: 21 U/L (ref 0–53)
AST: 18 U/L (ref 0–37)
Albumin: 4.7 g/dL (ref 3.5–5.2)
Alkaline Phosphatase: 52 U/L (ref 39–117)
BUN: 12 mg/dL (ref 6–23)
CO2: 27 meq/L (ref 19–32)
Calcium: 9.7 mg/dL (ref 8.4–10.5)
Chloride: 103 meq/L (ref 96–112)
Creatinine, Ser: 0.82 mg/dL (ref 0.40–1.50)
GFR: 87.54 mL/min (ref >60.00)
Glucose, Bld: 117 mg/dL — ABNORMAL HIGH (ref 70–99)
Potassium: 4.7 meq/L (ref 3.5–5.1)
Sodium: 139 meq/L (ref 135–145)
Total Bilirubin: 0.5 mg/dL (ref 0.2–1.2)
Total Protein: 7.2 g/dL (ref 6.0–8.3)

## 2024-07-10 LAB — CBC WITH DIFFERENTIAL/PLATELET
Basophils Absolute: 0 K/uL (ref 0.0–0.1)
Basophils Relative: 0.6 % (ref 0.0–3.0)
Eosinophils Absolute: 0.1 K/uL (ref 0.0–0.7)
Eosinophils Relative: 0.9 % (ref 0.0–5.0)
HCT: 39.2 % (ref 39.0–52.0)
Hemoglobin: 13.3 g/dL (ref 13.0–17.0)
Lymphocytes Relative: 35.5 % (ref 12.0–46.0)
Lymphs Abs: 2.7 K/uL (ref 0.7–4.0)
MCHC: 34 g/dL (ref 30.0–36.0)
MCV: 88 fl (ref 78.0–100.0)
Monocytes Absolute: 0.5 K/uL (ref 0.1–1.0)
Monocytes Relative: 6.5 % (ref 3.0–12.0)
Neutro Abs: 4.4 K/uL (ref 1.4–7.7)
Neutrophils Relative %: 56.5 % (ref 43.0–77.0)
Platelets: 256 K/uL (ref 150.0–400.0)
RBC: 4.46 Mil/uL (ref 4.22–5.81)
RDW: 13.7 % (ref 11.5–15.5)
WBC: 7.7 K/uL (ref 4.0–10.5)

## 2024-07-10 LAB — LIPID PANEL
Cholesterol: 125 mg/dL (ref 0–200)
HDL: 48 mg/dL (ref >39.00)
LDL Cholesterol: 58 mg/dL (ref 0–99)
NonHDL: 77.4
Total CHOL/HDL Ratio: 3
Triglycerides: 97 mg/dL (ref 0.0–149.0)
VLDL: 19.4 mg/dL (ref 0.0–40.0)

## 2024-07-10 LAB — POCT GLYCOSYLATED HEMOGLOBIN (HGB A1C): HbA1c POC (<> result, manual entry): 6.4 % (ref 4.0–5.6)

## 2024-07-10 LAB — VITAMIN D 25 HYDROXY (VIT D DEFICIENCY, FRACTURES): VITD: 11.56 ng/mL — ABNORMAL LOW (ref 30.00–100.00)

## 2024-07-10 LAB — TSH: TSH: 2.87 u[IU]/mL (ref 0.35–5.50)

## 2024-07-10 LAB — TESTOSTERONE: Testosterone: 426.37 ng/dL (ref 300.00–890.00)

## 2024-07-10 LAB — HEMOGLOBIN A1C: Hgb A1c MFr Bld: 6.5 % (ref 4.6–6.5)

## 2024-07-10 LAB — VITAMIN B12: Vitamin B-12: 712 pg/mL (ref 211–911)

## 2024-07-10 LAB — PSA: PSA: 0.01 ng/mL — ABNORMAL LOW (ref 0.10–4.00)

## 2024-07-10 MED ORDER — VITAMIN D (ERGOCALCIFEROL) 1.25 MG (50000 UNIT) PO CAPS: 50000.0000 [IU] | ORAL_CAPSULE | ORAL | 1 refills | Status: AC

## 2024-07-10 NOTE — Assessment & Plan Note (Signed)
 Continues to complain about occasional fatigue mostly on exertion Clinically stable.  No red flag signs or symptoms Recommend blood work today

## 2024-07-10 NOTE — Assessment & Plan Note (Signed)
 Treated in the past.  No concerns.

## 2024-07-10 NOTE — Assessment & Plan Note (Signed)
Continues atorvastatin 80 mg daily Diet and nutrition discussed

## 2024-07-10 NOTE — Progress Notes (Signed)
 Kenneth Velez 73 y.o.   Chief Complaint  Patient presents with   Follow-up    Cholesterol checked and pt state that he has been very fatigued     HISTORY OF PRESENT ILLNESS: This is a 73 y.o. male here for 52-month follow-up of multiple chronic medical conditions Complaining of occasional fatigue shortly after starting to work or exert himself. No other complaints or medical concerns today. BP Readings from Last 3 Encounters:  07/10/24 (!) 142/80  02/13/24 (!) 134/51  01/09/24 (!) 140/74   Wt Readings from Last 3 Encounters:  07/10/24 174 lb (78.9 kg)  01/09/24 174 lb (78.9 kg)  12/05/23 183 lb (83 kg)   Lab Results  Component Value Date   HGBA1C 6.6 (A) 01/09/2024     LV perfusion is normal. There is no evidence of ischemia. There is no evidence of infarction.   Rest left ventricular function is normal. Rest EF: 53%. Stress left ventricular function is normal. Stress EF: 58%. End diastolic cavity size is normal. End systolic cavity size is normal.   Myocardial blood flow was computed to be 0.20ml/g/min at rest and 1.48ml/g/min at stress. Global myocardial blood flow reserve was 2.32 and was normal.   Coronary calcium  was present on the attenuation correction CT images. Mild coronary calcifications were present. Coronary calcifications were present in the left anterior descending artery and right coronary artery distribution(s).   The study is normal. The study is low risk.  HPI   Prior to Admission medications   Medication Sig Start Date End Date Taking? Authorizing Provider  aspirin  EC 81 MG tablet Take 1 tablet (81 mg total) by mouth daily. 02/25/19  Yes Shlomo Wilbert SAUNDERS, MD  atorvastatin  (LIPITOR) 80 MG tablet Take 1 tablet by mouth once daily 01/10/24  Yes Turner, Wilbert SAUNDERS, MD  diltiazem  (CARDIZEM  CD) 180 MG 24 hr capsule Take 1 capsule by mouth once daily 01/10/24  Yes Turner, Wilbert SAUNDERS, MD  flecainide  (TAMBOCOR ) 50 MG tablet Take 1 tablet (50 mg total) by mouth 2 (two)  times daily. 06/18/24  Yes Turner, Wilbert SAUNDERS, MD  sitaGLIPtin-metformin (JANUMET ) 50-500 MG tablet TAKE 1 TABLET BY MOUTH TWICE DAILY WITH A MEAL 02/09/24  Yes Kessler Kopinski, Emil Schanz, MD  tadalafil (CIALIS) 20 MG tablet Take 20 mg by mouth every other day. 07/30/19  Yes [provider]    No Known Allergies  Patient Active Problem List   Diagnosis Date Noted   CAD (coronary artery disease), native coronary artery 04/19/2021   Aortic atherosclerosis 03/29/2021   Dyslipidemia associated with type 2 diabetes mellitus (HCC) 06/23/2020   Interstitial pulmonary disease (HCC) 06/23/2020   Hypertension associated with diabetes (HCC) 10/08/2019   Sinus node dysfunction (HCC) 10/08/2019   Exertional angina 06/04/2018   Malignant neoplasm of prostate (HCC) 04/20/2015   Osteoarthritis of left hip 09/21/2012    Past Medical History:  Diagnosis Date   Aortic atherosclerosis    Arthritis    CAD (coronary artery disease), native coronary artery    mild ASCAD with coronary CTA showing mild CAD in the prox to mid RCA 25-49% and minimal calcified plaque in the ostial and mid LAD and prox LCX.  Calcium  score was 23.  FFR showed no flow limiting lesion.   Diabetes mellitus without complication (HCC)    HLD (hyperlipidemia)    HTN (hypertension), benign    Malignant neoplasm of prostate (HCC) 04/20/2015   Osteoarthritis of left hip 09/21/2012   Prostate cancer (HCC)  Past Surgical History:  Procedure Laterality Date   JOINT REPLACEMENT     PROSTATE BIOPSY     TOTAL HIP ARTHROPLASTY Left 09/21/2012   Dr Shari   TOTAL HIP ARTHROPLASTY Left 09/21/2012   Procedure: TOTAL HIP ARTHROPLASTY;  Surgeon: LELON JONETTA Shari Mickey., MD;  Location: [facility] OR;  Service: Orthopedics;  Laterality: Left;    Social History   Socioeconomic History   Marital status: Married    Spouse name: Not on file   Number of children: Not on file   Years of education: Not on file   Highest education level: Not on file   Occupational History   Occupation: RETIRED  Tobacco Use   Smoking status: Never   Smokeless tobacco: Never  Vaping Use   Vaping status: Never Used  Substance and Sexual Activity   Alcohol use: No    Comment: occasional   Drug use: No   Sexual activity: Yes  Other Topics Concern   Not on file  Social History Narrative   Lives with wife   Social Drivers of Health   Financial Resource Strain: Medium Risk (12/05/2023)   Overall Financial Resource Strain (CARDIA)    Difficulty of Paying Living Expenses: Somewhat hard  Food Insecurity: No Food Insecurity (12/05/2023)   Hunger Vital Sign    Worried About Running Out of Food in the Last Year: Never true    Ran Out of Food in the Last Year: Never true  Transportation Needs: No Transportation Needs (12/05/2023)   PRAPARE - Administrator, Civil Service (Medical): No    Lack of Transportation (Non-Medical): No  Physical Activity: Insufficiently Active (12/05/2023)   Exercise Vital Sign    Days of Exercise per Week: 1 day    Minutes of Exercise per Session: 60 min  Stress: No Stress Concern Present (12/05/2023)   [employer] of Occupational Health - Occupational Stress Questionnaire    Feeling of Stress : Not at all  Social Connections: Moderately Integrated (12/05/2023)   Social Connection and Isolation Panel    Frequency of Communication with Friends and Family: Three times a week    Frequency of Social Gatherings with Friends and Family: Once a week    Attends Religious Services: More than 4 times per year    Active Member of [employer] or Organizations: No    Attends Banker Meetings: Never    Marital Status: Married  Catering Manager Violence: Not At Risk (12/05/2023)   Humiliation, Afraid, Rape, and Kick questionnaire    Fear of Current or Ex-Partner: No    Emotionally Abused: No    Physically Abused: No    Sexually Abused: No    Family History  Problem Relation Age of Onset   Diabetes Mother    Colon  cancer Father    Pancreatic cancer Neg Hx    Rectal cancer Neg Hx    Stomach cancer Neg Hx    Esophageal cancer Neg Hx    Cancer Neg Hx      Review of Systems  Constitutional:  Positive for malaise/fatigue.  HENT: Negative.  Negative for congestion and sore throat.   Respiratory: Negative.  Negative for cough and shortness of breath.   Cardiovascular: Negative.  Negative for chest pain and palpitations.  Gastrointestinal:  Negative for abdominal pain, diarrhea, nausea and vomiting.  Genitourinary: Negative.  Negative for dysuria and hematuria.  Skin: Negative.  Negative for rash.  Neurological: Negative.  Negative for dizziness and headaches.  All other  systems reviewed and are negative.   Vitals:   07/10/24 1038 07/10/24 1043  BP: (!) 142/80 (!) 142/80  Pulse: 62   Temp: 98.2 F (36.8 C)   SpO2: 98%     Physical Exam Vitals reviewed.  Constitutional:      Appearance: Normal appearance.  HENT:     Head: Normocephalic.     Mouth/Throat:     Mouth: Mucous membranes are moist.     Pharynx: Oropharynx is clear.  Eyes:     Extraocular Movements: Extraocular movements intact.     Pupils: Pupils are equal, round, and reactive to light.  Cardiovascular:     Rate and Rhythm: Normal rate and regular rhythm.     Pulses: Normal pulses.     Heart sounds: Normal heart sounds.  Pulmonary:     Effort: Pulmonary effort is normal.     Breath sounds: Normal breath sounds.  Abdominal:     Palpations: Abdomen is soft.     Tenderness: There is no abdominal tenderness.  Musculoskeletal:     Cervical back: No tenderness.  Lymphadenopathy:     Cervical: No cervical adenopathy.  Skin:    General: Skin is warm and dry.     Capillary Refill: Capillary refill takes less than 2 seconds.  Neurological:     General: No focal deficit present.     Mental Status: He is alert and oriented to person, place, and time.  Psychiatric:        Mood and Affect: Mood normal.        Behavior:  Behavior normal.    Results for orders placed or performed in visit on 07/10/24 (from the past 24 hours)  POCT HgB A1C     Status: Abnormal   Collection Time: 07/10/24 11:36 AM  Result Value Ref Range   Hemoglobin A1C     HbA1c POC (<> result, manual entry) 6.4 4.0 - 5.6 %   HbA1c, POC (prediabetic range)     HbA1c, POC (controlled diabetic range)       ASSESSMENT & PLAN: A total of 42 minutes was spent with the patient and counseling/coordination of care regarding preparing for this visit, review of most recent office visit notes, review of multiple chronic medical conditions and their management, differential diagnosis of general fatigue and need for blood work, review of all medications, review of most recent bloodwork results including interpretation of today's hemoglobin A1c, review of health maintenance items, education on nutrition, prognosis, documentation, and need for follow up.   Problem List Items Addressed This Visit       Cardiovascular and Mediastinum   Hypertension associated with diabetes (HCC) - Primary   Well-controlled hypertension Continue diltiazem  180 mg daily Well-controlled diabetes with hemoglobin A1c of 6.4 Continue Janumet  50-500 mg twice a day Cardiovascular risks associated with diabetes and hypertension discussed Dietary approaches to stop hypertension discussed Blood work done today Follow-up in 6 months      Relevant Orders   POCT HgB A1C   ABO/Rh   CBC with Differential/Platelet   Comprehensive metabolic panel with GFR   Hemoglobin A1c   Lipid panel   TSH   Vitamin B12   VITAMIN D 25 Hydroxy (Vit-D Deficiency, Fractures)   Testosterone    Aortic atherosclerosis   Continues atorvastatin  80 mg daily Diet and nutrition discussed      CAD (coronary artery disease), native coronary artery   No recent anginal episodes Continues diltiazem  for blood pressure control Continues Tambocor  for rhythm control  Continues 1 daily baby  aspirin  Recent myocardial perfusion scan results as follows:    LV perfusion is normal. There is no evidence of ischemia. There is no evidence of infarction.   Rest left ventricular function is normal. Rest EF: 53%. Stress left ventricular function is normal. Stress EF: 58%. End diastolic cavity size is normal. End systolic cavity size is normal.   Myocardial blood flow was computed to be 0.48ml/g/min at rest and 1.96ml/g/min at stress. Global myocardial blood flow reserve was 2.32 and was normal.   Coronary calcium  was present on the attenuation correction CT images. Mild coronary calcifications were present. Coronary calcifications were present in the left anterior descending artery and right coronary artery distribution(s).   The study is normal. The study is low risk.        Endocrine   Dyslipidemia associated with type 2 diabetes mellitus (HCC)   Chronic stable condition Continue atorvastatin  80 mg daily Diet and nutrition discussed        Other   History of prostate cancer   Treated in the past.  No concerns.      Relevant Orders   PSA   Other fatigue   Continues to complain about occasional fatigue mostly on exertion Clinically stable.  No red flag signs or symptoms Recommend blood work today      Relevant Orders   ABO/Rh   CBC with Differential/Platelet   Comprehensive metabolic panel with GFR   Hemoglobin A1c   Lipid panel   TSH   Vitamin B12   VITAMIN D 25 Hydroxy (Vit-D Deficiency, Fractures)   Testosterone    Patient Instructions  Health Maintenance After Age 56 After age 61, you are at a higher risk for certain long-term diseases and infections as well as injuries from falls. Falls are a major cause of broken bones and head injuries in people who are older than age 22. Getting regular preventive care can help to keep you healthy and well. Preventive care includes getting regular testing and making lifestyle changes as recommended by your health care provider.  Talk with your health care provider about: Which screenings and tests you should have. A screening is a test that checks for a disease when you have no symptoms. A diet and exercise plan that is right for you. What should I know about screenings and tests to prevent falls? Screening and testing are the best ways to find a health problem early. Early diagnosis and treatment give you the best chance of managing medical conditions that are common after age 5. Certain conditions and lifestyle choices may make you more likely to have a fall. Your health care provider may recommend: Regular vision checks. Poor vision and conditions such as cataracts can make you more likely to have a fall. If you wear glasses, make sure to get your prescription updated if your vision changes. Medicine review. Work with your health care provider to regularly review all of the medicines you are taking, including over-the-counter medicines. Ask your health care provider about any side effects that may make you more likely to have a fall. Tell your health care provider if any medicines that you take make you feel dizzy or sleepy. Strength and balance checks. Your health care provider may recommend certain tests to check your strength and balance while standing, walking, or changing positions. Foot health exam. Foot pain and numbness, as well as not wearing proper footwear, can make you more likely to have a fall. Screenings, including: Osteoporosis screening. Osteoporosis  is a condition that causes the bones to get weaker and break more easily. Blood pressure screening. Blood pressure changes and medicines to control blood pressure can make you feel dizzy. Depression screening. You may be more likely to have a fall if you have a fear of falling, feel depressed, or feel unable to do activities that you used to do. Alcohol use screening. Using too much alcohol can affect your balance and may make you more likely to have a  fall. Follow these instructions at home: Lifestyle Do not drink alcohol if: Your health care provider tells you not to drink. If you drink alcohol: Limit how much you have to: 0-1 drink a day for women. 0-2 drinks a day for men. Know how much alcohol is in your drink. In the [place] one drink equals one 12 oz bottle of beer (355 mL), one 5 oz glass of wine (148 mL), or one 1 oz glass of hard liquor (44 mL). Do not use any products that contain nicotine or tobacco. These products include cigarettes, chewing tobacco, and vaping devices, such as e-cigarettes. If you need help quitting, ask your health care provider. Activity  Follow a regular exercise program to stay fit. This will help you maintain your balance. Ask your health care provider what types of exercise are appropriate for you. If you need a cane or walker, use it as recommended by your health care provider. Wear supportive shoes that have nonskid soles. Safety  Remove any tripping hazards, such as rugs, cords, and clutter. Install safety equipment such as grab bars in bathrooms and safety rails on stairs. Keep rooms and walkways well-lit. General instructions Talk with your health care provider about your risks for falling. Tell your health care provider if: You fall. Be sure to tell your health care provider about all falls, even ones that seem minor. You feel dizzy, tiredness (fatigue), or off-balance. Take over-the-counter and prescription medicines only as told by your health care provider. These include supplements. Eat a healthy diet and maintain a healthy weight. A healthy diet includes low-fat dairy products, low-fat (lean) meats, and fiber from whole grains, beans, and lots of fruits and vegetables. Stay current with your vaccines. Schedule regular health, dental, and eye exams. Summary Having a healthy lifestyle and getting preventive care can help to protect your health and wellness after age 77. Screening and  testing are the best way to find a health problem early and help you avoid having a fall. Early diagnosis and treatment give you the best chance for managing medical conditions that are more common for people who are older than age 47. Falls are a major cause of broken bones and head injuries in people who are older than age 90. Take precautions to prevent a fall at home. Work with your health care provider to learn what changes you can make to improve your health and wellness and to prevent falls. This information is not intended to replace advice given to you by your health care provider. Make sure you discuss any questions you have with your health care provider. Document Revised: 12/07/2020 Document Reviewed: 12/07/2020 Elsevier Patient Education  2024 Elsevier Inc.    Emil Schaumann, MD [facility]

## 2024-07-10 NOTE — Assessment & Plan Note (Signed)
 Well-controlled hypertension Continue diltiazem  180 mg daily Well-controlled diabetes with hemoglobin A1c of 6.4 Continue Janumet  50-500 mg twice a day Cardiovascular risks associated with diabetes and hypertension discussed Dietary approaches to stop hypertension discussed Blood work done today Follow-up in 6 months

## 2024-07-10 NOTE — Patient Instructions (Signed)
 Health Maintenance After Age 72 After age 27, you are at a higher risk for certain long-term diseases and infections as well as injuries from falls. Falls are a major cause of broken bones and head injuries in people who are older than age 73. Getting regular preventive care can help to keep you healthy and well. Preventive care includes getting regular testing and making lifestyle changes as recommended by your health care provider. Talk with your health care provider about: Which screenings and tests you should have. A screening is a test that checks for a disease when you have no symptoms. A diet and exercise plan that is right for you. What should I know about screenings and tests to prevent falls? Screening and testing are the best ways to find a health problem early. Early diagnosis and treatment give you the best chance of managing medical conditions that are common after age 90. Certain conditions and lifestyle choices may make you more likely to have a fall. Your health care provider may recommend: Regular vision checks. Poor vision and conditions such as cataracts can make you more likely to have a fall. If you wear glasses, make sure to get your prescription updated if your vision changes. Medicine review. Work with your health care provider to regularly review all of the medicines you are taking, including over-the-counter medicines. Ask your health care provider about any side effects that may make you more likely to have a fall. Tell your health care provider if any medicines that you take make you feel dizzy or sleepy. Strength and balance checks. Your health care provider may recommend certain tests to check your strength and balance while standing, walking, or changing positions. Foot health exam. Foot pain and numbness, as well as not wearing proper footwear, can make you more likely to have a fall. Screenings, including: Osteoporosis screening. Osteoporosis is a condition that causes  the bones to get weaker and break more easily. Blood pressure screening. Blood pressure changes and medicines to control blood pressure can make you feel dizzy. Depression screening. You may be more likely to have a fall if you have a fear of falling, feel depressed, or feel unable to do activities that you used to do. Alcohol  use screening. Using too much alcohol  can affect your balance and may make you more likely to have a fall. Follow these instructions at home: Lifestyle Do not drink alcohol  if: Your health care provider tells you not to drink. If you drink alcohol : Limit how much you have to: 0-1 drink a day for women. 0-2 drinks a day for men. Know how much alcohol  is in your drink. In the [place] one drink equals one 12 oz bottle of beer (355 mL), one 5 oz glass of wine (148 mL), or one 1 oz glass of hard liquor (44 mL). Do not use any products that contain nicotine or tobacco. These products include cigarettes, chewing tobacco, and vaping devices, such as e-cigarettes. If you need help quitting, ask your health care provider. Activity  Follow a regular exercise program to stay fit. This will help you maintain your balance. Ask your health care provider what types of exercise are appropriate for you. If you need a cane or walker, use it as recommended by your health care provider. Wear supportive shoes that have nonskid soles. Safety  Remove any tripping hazards, such as rugs, cords, and clutter. Install safety equipment such as grab bars in bathrooms and safety rails on stairs. Keep rooms and walkways  well-lit. General instructions Talk with your health care provider about your risks for falling. Tell your health care provider if: You fall. Be sure to tell your health care provider about all falls, even ones that seem minor. You feel dizzy, tiredness (fatigue), or off-balance. Take over-the-counter and prescription medicines only as told by your health care provider. These include  supplements. Eat a healthy diet and maintain a healthy weight. A healthy diet includes low-fat dairy products, low-fat (lean) meats, and fiber from whole grains, beans, and lots of fruits and vegetables. Stay current with your vaccines. Schedule regular health, dental, and eye exams. Summary Having a healthy lifestyle and getting preventive care can help to protect your health and wellness after age 15. Screening and testing are the best way to find a health problem early and help you avoid having a fall. Early diagnosis and treatment give you the best chance for managing medical conditions that are more common for people who are older than age 42. Falls are a major cause of broken bones and head injuries in people who are older than age 64. Take precautions to prevent a fall at home. Work with your health care provider to learn what changes you can make to improve your health and wellness and to prevent falls. This information is not intended to replace advice given to you by your health care provider. Make sure you discuss any questions you have with your health care provider. Document Revised: 12/07/2020 Document Reviewed: 12/07/2020 Elsevier Patient Education  2024 [employer].

## 2024-07-10 NOTE — Assessment & Plan Note (Signed)
Chronic stable condition Continue atorvastatin 80 mg daily Diet and nutrition discussed 

## 2024-07-10 NOTE — Assessment & Plan Note (Signed)
 No recent anginal episodes Continues diltiazem  for blood pressure control Continues Tambocor  for rhythm control Continues 1 daily baby aspirin  Recent myocardial perfusion scan results as follows:    LV perfusion is normal. There is no evidence of ischemia. There is no evidence of infarction.   Rest left ventricular function is normal. Rest EF: 53%. Stress left ventricular function is normal. Stress EF: 58%. End diastolic cavity size is normal. End systolic cavity size is normal.   Myocardial blood flow was computed to be 0.42ml/g/min at rest and 1.57ml/g/min at stress. Global myocardial blood flow reserve was 2.32 and was normal.   Coronary calcium  was present on the attenuation correction CT images. Mild coronary calcifications were present. Coronary calcifications were present in the left anterior descending artery and right coronary artery distribution(s).   The study is normal. The study is low risk.

## 2024-07-11 LAB — ABO/RH: Rh Factor: POSITIVE

## 2024-07-30 ENCOUNTER — Telehealth: Payer: Self-pay | Admitting: Emergency Medicine

## 2024-07-30 NOTE — Telephone Encounter (Signed)
 Patient dropped off document Insurance Form, to be filled out by provider. Patient requested to send it back via Fax within 7-days. Document is located in providers tray at front office.Please advise at [phone]

## 2024-07-31 NOTE — Telephone Encounter (Signed)
 This has been received and I'm currently working on this

## 2024-08-02 ENCOUNTER — Encounter: Payer: Self-pay | Admitting: *Deleted

## 2024-08-02 NOTE — Progress Notes (Signed)
 Kenneth Velez                                          MRN: [id number]   08/02/2024   The [facility] Quality Team Specialist reviewed this patient medical record for the purposes of chart review for care gap closure. The following were reviewed: chart review for care gap closure-kidney health evaluation for diabetes:eGFR  and uACR.    [facility] Quality Team

## 2024-08-05 NOTE — Telephone Encounter (Signed)
 This has been fax back successfully as of today

## 2024-08-05 NOTE — Telephone Encounter (Signed)
 This has been placed in providers box to sign

## 2024-08-05 NOTE — Telephone Encounter (Signed)
 Copied from CRM [id number]. Topic: General - Other >> Aug 05, 2024 12:10 PM Aleatha C wrote: Reason for CRM: Patient dropped off document Insurance Form, to be filled out by provider. Patient requested that it to send it back via Fax within 7-days that  was on the 12/28, Patient called on 12/30 to check on it th Document is located in providers tray at front office.This Patient 2nd time checking on paperwork, Stated that is urgent and needs to be filled out as soon as possible for insurance and will be calling back tomorrow if nothing is done today Please advise at [phone]

## 2024-08-05 NOTE — Telephone Encounter (Signed)
 Signed

## 2024-08-05 NOTE — Telephone Encounter (Signed)
 Original copy located in my desk

## 2024-12-06 ENCOUNTER — Ambulatory Visit

## 2025-01-08 ENCOUNTER — Ambulatory Visit: Admitting: Emergency Medicine
